# Patient Record
Sex: Male | Born: 1945 | Race: White | Hispanic: No | Marital: Married | Smoking: Never smoker
Health system: Southern US, Community
[De-identification: ages and names within clinical notes are randomized; demographics above are authoritative.]

## PROBLEM LIST (undated history)

## (undated) DIAGNOSIS — H3530 Unspecified macular degeneration: Secondary | ICD-10-CM

## (undated) DIAGNOSIS — E079 Disorder of thyroid, unspecified: Secondary | ICD-10-CM

## (undated) DIAGNOSIS — M1990 Unspecified osteoarthritis, unspecified site: Secondary | ICD-10-CM

## (undated) DIAGNOSIS — E039 Hypothyroidism, unspecified: Secondary | ICD-10-CM

## (undated) DIAGNOSIS — E785 Hyperlipidemia, unspecified: Secondary | ICD-10-CM

## (undated) DIAGNOSIS — K219 Gastro-esophageal reflux disease without esophagitis: Secondary | ICD-10-CM

## (undated) HISTORY — DX: Unspecified macular degeneration: H35.30

## (undated) HISTORY — DX: Gastro-esophageal reflux disease without esophagitis: K21.9

## (undated) HISTORY — DX: Disorder of thyroid, unspecified: E07.9

## (undated) HISTORY — DX: Hyperlipidemia, unspecified: E78.5

## (undated) HISTORY — PX: SHOULDER ARTHROSCOPY W/ ROTATOR CUFF REPAIR: SHX2400

## (undated) HISTORY — PX: ANKLE ARTHROSCOPY WITH REPAIR SUBLUXING TENDON: SHX5584

## (undated) HISTORY — PX: KNEE CARTILAGE SURGERY: SHX688

---

## 2001-01-27 ENCOUNTER — Encounter: Payer: Self-pay | Admitting: Orthopaedic Surgery

## 2001-01-27 ENCOUNTER — Ambulatory Visit (HOSPITAL_COMMUNITY): Admission: RE | Admit: 2001-01-27 | Discharge: 2001-01-27 | Payer: Self-pay | Admitting: Orthopaedic Surgery

## 2001-02-01 ENCOUNTER — Encounter: Payer: Self-pay | Admitting: Orthopaedic Surgery

## 2001-02-01 ENCOUNTER — Ambulatory Visit (HOSPITAL_COMMUNITY): Admission: RE | Admit: 2001-02-01 | Discharge: 2001-02-01 | Payer: Self-pay | Admitting: Orthopaedic Surgery

## 2003-12-14 ENCOUNTER — Emergency Department (HOSPITAL_COMMUNITY): Admission: EM | Admit: 2003-12-14 | Discharge: 2003-12-14 | Payer: Self-pay | Admitting: Emergency Medicine

## 2010-09-30 ENCOUNTER — Other Ambulatory Visit (HOSPITAL_COMMUNITY): Payer: Self-pay | Admitting: Family Medicine

## 2010-09-30 DIAGNOSIS — E0590 Thyrotoxicosis, unspecified without thyrotoxic crisis or storm: Secondary | ICD-10-CM

## 2010-10-12 ENCOUNTER — Emergency Department (HOSPITAL_COMMUNITY)
Admission: EM | Admit: 2010-10-12 | Discharge: 2010-10-13 | Disposition: A | Discharge status: Home / Self Care | Payer: BC Managed Care – PPO | Attending: Emergency Medicine | Admitting: Emergency Medicine

## 2010-10-12 DIAGNOSIS — IMO0002 Reserved for concepts with insufficient information to code with codable children: Secondary | ICD-10-CM | POA: Insufficient documentation

## 2010-10-12 DIAGNOSIS — S0180XA Unspecified open wound of other part of head, initial encounter: Secondary | ICD-10-CM | POA: Insufficient documentation

## 2010-10-13 ENCOUNTER — Encounter (HOSPITAL_COMMUNITY)
Admission: RE | Admit: 2010-10-13 | Discharge: 2010-10-13 | Disposition: A | Discharge status: Home / Self Care | Payer: BC Managed Care – PPO | Source: Ambulatory Visit | Attending: Family Medicine | Admitting: Family Medicine

## 2010-10-13 DIAGNOSIS — E0590 Thyrotoxicosis, unspecified without thyrotoxic crisis or storm: Secondary | ICD-10-CM | POA: Insufficient documentation

## 2010-10-14 ENCOUNTER — Ambulatory Visit (HOSPITAL_COMMUNITY)
Admission: RE | Admit: 2010-10-14 | Discharge: 2010-10-14 | Disposition: A | Discharge status: Home / Self Care | Payer: BC Managed Care – PPO | Source: Ambulatory Visit | Attending: Family Medicine | Admitting: Family Medicine

## 2010-10-14 DIAGNOSIS — E0590 Thyrotoxicosis, unspecified without thyrotoxic crisis or storm: Secondary | ICD-10-CM | POA: Insufficient documentation

## 2010-10-14 MED ORDER — SODIUM IODIDE I 131 CAPSULE
9.1000 | Freq: Once | INTRAVENOUS | Status: AC | PRN
  Administered 2010-10-13: 9.1 via ORAL

## 2010-10-14 MED ORDER — SODIUM PERTECHNETATE TC 99M INJECTION
10.5000 | Freq: Once | INTRAVENOUS | Status: AC | PRN
  Administered 2010-10-14: 11 via INTRAVENOUS

## 2013-03-19 ENCOUNTER — Encounter: Payer: Self-pay | Admitting: Internal Medicine

## 2013-04-30 ENCOUNTER — Ambulatory Visit (AMBULATORY_SURGERY_CENTER): Payer: Self-pay | Admitting: *Deleted

## 2013-04-30 VITALS — Ht 72.0 in | Wt 203.2 lb

## 2013-04-30 DIAGNOSIS — Z1211 Encounter for screening for malignant neoplasm of colon: Secondary | ICD-10-CM

## 2013-04-30 MED ORDER — NA SULFATE-K SULFATE-MG SULF 17.5-3.13-1.6 GM/177ML PO SOLN: ORAL | Status: DC

## 2013-04-30 NOTE — Progress Notes (Signed)
Patient denies any allergies to eggs or soy. Patient denies any problems with anesthesia. No oxygen use at home. No diet/wt. Loss pills.

## 2013-05-01 ENCOUNTER — Encounter: Payer: Self-pay | Admitting: Internal Medicine

## 2013-05-07 ENCOUNTER — Encounter: Payer: Self-pay | Admitting: Internal Medicine

## 2013-05-14 ENCOUNTER — Ambulatory Visit (AMBULATORY_SURGERY_CENTER): Payer: No Typology Code available for payment source | Admitting: Internal Medicine

## 2013-05-14 ENCOUNTER — Encounter: Payer: Self-pay | Admitting: Internal Medicine

## 2013-05-14 VITALS — BP 119/58 | HR 52 | Temp 96.6°F | Resp 19 | Ht 72.0 in | Wt 203.0 lb

## 2013-05-14 DIAGNOSIS — D129 Benign neoplasm of anus and anal canal: Secondary | ICD-10-CM

## 2013-05-14 DIAGNOSIS — D128 Benign neoplasm of rectum: Secondary | ICD-10-CM

## 2013-05-14 DIAGNOSIS — Z1211 Encounter for screening for malignant neoplasm of colon: Secondary | ICD-10-CM

## 2013-05-14 DIAGNOSIS — D126 Benign neoplasm of colon, unspecified: Secondary | ICD-10-CM

## 2013-05-14 MED ORDER — SODIUM CHLORIDE 0.9 % IV SOLN: 500.0000 mL | INTRAVENOUS | Status: DC

## 2013-05-14 NOTE — Op Note (Signed)
[place]  [address] [place], [address]   COLONOSCOPY PROCEDURE REPORT  PATIENT: Eddie Young, Eddie Young  MR#: [id number] BIRTHDATE: [birth year] , 67  yrs. old GENDER: Male ENDOSCOPIST: Gatha Mayer, MD, [facility] PROCEDURE DATE:  05/14/2013 PROCEDURE:   Colonoscopy with biopsy First Screening Colonoscopy - Avg.  risk and is 50 yrs.  old or older - No.  Prior Negative Screening - Now for repeat screening. 10 or more years since last screening  History of Adenoma - Now for follow-up colonoscopy & has been > or = to 3 yrs.  N/A  Polyps Removed Today? Yes. ASA CLASS:   Class II INDICATIONS:average risk screening and Last colonoscopy performed 10 years ago. MEDICATIONS: Propofol (Diprivan) 180  DESCRIPTION OF PROCEDURE:   After the risks benefits and alternatives of the procedure were thoroughly explained, informed consent was obtained.  A digital rectal exam revealed no abnormalities of the rectum, A digital rectal exam revealed no prostatic nodules, and A digital rectal exam revealed the prostate was not enlarged.   The LB [id number]  endoscope was introduced through the anus and advanced to the cecum, which was identified by both the appendix and ileocecal valve. No adverse events experienced.   The quality of the prep was excellent using Suprep  The instrument was then slowly withdrawn as the colon was fully examined.  COLON FINDINGS: Three diminutive sessile polyps were found at the splenic flexure.  A polypectomy was performed with cold forceps. The resection was complete and the polyp tissue was completely retrieved.   The colon mucosa was otherwise normal.   A right colon retroflexion was performed.  Retroflexed views revealed no abnormalities. The time to cecum=1 minutes 28 seconds.  Withdrawal time=10 minutes 29 seconds.  The scope was withdrawn and the procedure completed. COMPLICATIONS: There were no complications.  ENDOSCOPIC IMPRESSION: 1.    Three diminutive sessile polyps were found at the splenic flexure; polypectomy was performed with cold forceps 2.   The colon mucosa was otherwise normal - excellent prep  RECOMMENDATIONS: Timing of repeat colonoscopy will be determined by pathology findings.   eSigned:  Gatha Mayer, MD, [facility] 05/14/2013 1:43 PM   cc: The Patient and Juanita Craver, MD

## 2013-05-14 NOTE — Patient Instructions (Addendum)
I found and removed three tiny polyps. They should be benign and may not be pre-cancerous.  I will let you know pathology results and when to have another routine colonoscopy by mail.  I appreciate the opportunity to care for you. Gatha Mayer, MD, FACG  YOU HAD AN ENDOSCOPIC PROCEDURE TODAY AT [facility]: Refer to the procedure report that was given to you for any specific questions about what was found during the examination.  If the procedure report does not answer your questions, please call your gastroenterologist to clarify.  If you requested that your care partner not be given the details of your procedure findings, then the procedure report has been included in a sealed envelope for you to review at your convenience later.  YOU SHOULD EXPECT: Some feelings of bloating in the abdomen. Passage of more gas than usual.  Walking can help get rid of the air that was put into your GI tract during the procedure and reduce the bloating. If you had a lower endoscopy (such as a colonoscopy or flexible sigmoidoscopy) you may notice spotting of blood in your stool or on the toilet paper. If you underwent a bowel prep for your procedure, then you may not have a normal bowel movement for a few days.  DIET: Your first meal following the procedure should be a light meal and then it is ok to progress to your normal diet.  A half-sandwich or bowl of soup is an example of a good first meal.  Heavy or fried foods are harder to digest and may make you feel nauseous or bloated.  Likewise meals heavy in dairy and vegetables can cause extra gas to form and this can also increase the bloating.  Drink plenty of fluids but you should avoid alcoholic beverages for 24 hours.  ACTIVITY: Your care partner should take you home directly after the procedure.  You should plan to take it easy, moving slowly for the rest of the day.  You can resume normal activity the day after the procedure however you should  NOT DRIVE or use heavy machinery for 24 hours (because of the sedation medicines used during the test).    SYMPTOMS TO REPORT IMMEDIATELY: A gastroenterologist can be reached at any hour.  During normal business hours, 8:30 AM to 5:00 PM Monday through Friday, call [phone].  After hours and on weekends, please call the GI answering service at [phone] who will take a message and have the physician on call contact you.   Following lower endoscopy (colonoscopy or flexible sigmoidoscopy):  Excessive amounts of blood in the stool  Significant tenderness or worsening of abdominal pains  Swelling of the abdomen that is new, acute  Fever of 100F or higher    FOLLOW UP: If any biopsies were taken you will be contacted by phone or by letter within the next 1-3 weeks.  Call your gastroenterologist if you have not heard about the biopsies in 3 weeks.  Our staff will call the home number listed on your records the next business day following your procedure to check on you and address any questions or concerns that you may have at that time regarding the information given to you following your procedure. This is a courtesy call and so if there is no answer at the home number and we have not heard from you through the emergency physician on call, we will assume that you have returned to your regular daily activities without incident.  SIGNATURES/CONFIDENTIALITY: You and/or your care partner have signed paperwork which will be entered into your electronic medical record.  These signatures attest to the fact that that the information above on your After Visit Summary has been reviewed and is understood.  Full responsibility of the confidentiality of this discharge information lies with you and/or your care-partner.  Polyp information given.

## 2013-05-14 NOTE — Progress Notes (Signed)
Report to pacu rn, vss, bbs=clear 

## 2013-05-14 NOTE — Progress Notes (Signed)
Called to room to assist during endoscopic procedure.  Patient ID and intended procedure confirmed with present staff. Received instructions for my participation in the procedure from the performing physician.  

## 2013-05-15 ENCOUNTER — Telehealth: Payer: Self-pay | Admitting: *Deleted

## 2013-05-15 NOTE — Telephone Encounter (Signed)
  Follow up Call-  Call back number 05/14/2013  Post procedure Call Back phone  # [phone]   Permission to leave phone message Yes     Patient questions:  Do you have a fever, pain , or abdominal swelling? no Pain Score  0 *  Have you tolerated food without any problems? yes  Have you been able to return to your normal activities? yes  Do you have any questions about your discharge instructions: Diet   no Medications  no Follow up visit  no  Do you have questions or concerns about your Care? no  Actions: * If pain score is 4 or above: No action needed, pain <4.

## 2013-05-19 ENCOUNTER — Encounter: Payer: Self-pay | Admitting: Internal Medicine

## 2013-05-19 NOTE — Progress Notes (Signed)
Quick Note:  Mucosal polyps Repeat colonoscopy 10 years 2025 - maybe (at 52) ______

## 2013-09-20 ENCOUNTER — Encounter: Payer: Self-pay | Admitting: Internal Medicine

## 2016-11-02 ENCOUNTER — Encounter (INDEPENDENT_AMBULATORY_CARE_PROVIDER_SITE_OTHER): Payer: Self-pay | Admitting: Orthopaedic Surgery

## 2016-11-02 ENCOUNTER — Ambulatory Visit (INDEPENDENT_AMBULATORY_CARE_PROVIDER_SITE_OTHER): Payer: BLUE CROSS/BLUE SHIELD | Admitting: Orthopaedic Surgery

## 2016-11-02 ENCOUNTER — Ambulatory Visit (INDEPENDENT_AMBULATORY_CARE_PROVIDER_SITE_OTHER): Payer: BLUE CROSS/BLUE SHIELD

## 2016-11-02 VITALS — BP 155/75 | HR 50 | Resp 14 | Ht 72.0 in | Wt 208.0 lb

## 2016-11-02 DIAGNOSIS — M25561 Pain in right knee: Secondary | ICD-10-CM

## 2016-11-02 DIAGNOSIS — G8929 Other chronic pain: Secondary | ICD-10-CM

## 2016-11-02 MED ORDER — METHYLPREDNISOLONE ACETATE 40 MG/ML IJ SUSP
80.0000 mg | INTRAMUSCULAR | Status: AC | PRN
  Administered 2016-11-02: 80 mg

## 2016-11-02 MED ORDER — BUPIVACAINE HCL 0.5 % IJ SOLN
3.0000 mL | INTRAMUSCULAR | Status: AC | PRN
  Administered 2016-11-02: 3 mL via INTRA_ARTICULAR

## 2016-11-02 MED ORDER — LIDOCAINE HCL 1 % IJ SOLN
5.0000 mL | INTRAMUSCULAR | Status: AC | PRN
  Administered 2016-11-02: 5 mL

## 2016-11-02 NOTE — Progress Notes (Signed)
Office Visit Note   Patient: Eddie Young           Date of Birth: 06-25-1945           MRN: [id number] Visit Date: 11/02/2016              Requested by: Stephens Shire, MD [address] [place], [place] [address] PCP: Stephens Shire, MD   Assessment & Plan: Visit Diagnoses:  1. Chronic pain of right knee   Moderate tricompartmental degenerative arthritis right knee  Plan: Long discussion regarding x-ray findings and treatment options. We'll try cortisone injection along the lateral compartment. Discussed NSAIDs. Also discussed knee replacement at some point in the future. That certainly will be his decision. We'll plan to see back as needed  Follow-Up Instructions: Return if symptoms worsen or fail to improve.   Orders:  Orders Placed This Encounter  Procedures  . Large Joint Injection/Arthrocentesis  . XR KNEE 3 VIEW RIGHT   No orders of the defined types were placed in this encounter.     Procedures: Large Joint Inj Date/Time: 11/02/2016 1:17 PM Performed by: Garald Balding Authorized by: Garald Balding   Consent Given by:  Patient Timeout: prior to procedure the correct patient, procedure, and site was verified   Indications:  Pain and joint swelling Location:  Knee Site:  R knee Prep: patient was prepped and draped in usual sterile fashion   Needle Size:  25 G Needle Length:  1.5 inches Approach:  Anteromedial Ultrasound Guidance: No   Fluoroscopic Guidance: No   Arthrogram: No   Medications:  5 mL lidocaine 1 %; 80 mg methylPREDNISolone acetate 40 MG/ML; 3 mL bupivacaine 0.5 % Aspiration Attempted: No   Patient tolerance:  Patient tolerated the procedure well with no immediate complications     Clinical Data: No additional findings.   Subjective: Chief Complaint  Patient presents with  . Right Knee - Pain    Eddie Young is a 71 y o that presents with R knee pain x 2 weeks.  He was walking lighthouse stairs in [place],  [place]. No fevers, chills or calf pain.  Initial onset of right knee pain after traveling to [place] to visit family. He's been home now 2 weeks and has had recurrent effusions and some pain predominantly along the lateral compartment. On occasion he has a sensation of his knee giving way. He does have a pullover knee support that's been helpful. Denies any back pain or groin discomfort. Pain is predominantly laterally  HPI  Review of Systems  Constitutional: Negative for fatigue.  HENT: Negative for hearing loss.   Respiratory: Negative for apnea, chest tightness and shortness of breath.   Cardiovascular: Negative for chest pain, palpitations and leg swelling.  Gastrointestinal: Negative for blood in stool, constipation and diarrhea.  Genitourinary: Negative for difficulty urinating.  Musculoskeletal: Positive for joint swelling. Negative for arthralgias, back pain, myalgias, neck pain and neck stiffness.  Neurological: Positive for weakness. Negative for numbness and headaches.  Hematological: Does not bruise/bleed easily.  Psychiatric/Behavioral: Negative for sleep disturbance. The patient is not nervous/anxious.      Objective: Vital Signs: BP (!) 155/75   Pulse (!) 50   Resp 14   Ht 6' (1.829 m)   Wt 208 lb (94.3 kg)   BMI 28.21 kg/m   Physical Exam  Ortho Exam awake alert and oriented 3. Comfortable sitting. Minimal effusion right knee. Knee was not hot or  warm. Diffuse lateral joint pain predominantly anteriorly. No medial joint pain. Some popliteal fullness with possible tibial cyst. No calf pain. Neurovascular exam intact distally. Lacks a few degrees to full extension and flexes over 105 without instability. Straight leg raise negative. Painless range of motion both hips. Walks without a limp  Specialty Comments:  No specialty comments available.  Imaging: Xr Knee 3 View Right  Result Date: 11/02/2016 3 views of the right knee were obtained standing  projection. There is increased valgus of about 7-8. There is near complete collapse of the lateral compartment there are peripheral osteophytes are identified in the lateral compartment so she with irregularity of the joint surface and the lateral compartment and subchondral sclerosis. There are tricompartmental degenerative changes and to a lesser extent the patellofemoral joint. No ectopic calcification. Changes above are consistent with moderate osteoarthritis predominantly in the patellofemoral and lateral compartments    PMFS History: There are no active problems to display for this patient.  Past Medical History:  Diagnosis Date  . GERD (gastroesophageal reflux disease)   . Hyperlipidemia   . Thyroid disease    hyper active    Family History  Problem Relation Age of Onset  . Thyroid cancer Maternal Grandmother   . Colon cancer Neg Hx     Past Surgical History:  Procedure Laterality Date  . ANKLE ARTHROSCOPY WITH REPAIR SUBLUXING TENDON    . KNEE CARTILAGE SURGERY    . SHOULDER ARTHROSCOPY W/ ROTATOR CUFF REPAIR     Social History   Occupational History  . Not on file.   Social History Main Topics  . Smoking status: Never Smoker  . Smokeless tobacco: Never Used  . Alcohol use 1.8 oz/week    3 Cans of beer per week  . Drug use: No  . Sexual activity: Not on file

## 2017-01-03 ENCOUNTER — Telehealth (INDEPENDENT_AMBULATORY_CARE_PROVIDER_SITE_OTHER): Payer: Self-pay | Admitting: Orthopaedic Surgery

## 2017-01-03 NOTE — Telephone Encounter (Signed)
Patient's granddaughter broke her foot and was wanting to know if Dr. Durward Fortes would take a look at her x-rays and see if she needs to see an orthopedic doctor once she gets back to [place].  CB#[phone].  Thank you.

## 2017-01-04 NOTE — Telephone Encounter (Signed)
Spoke with pt and told her PW would not be in office today. Wife said someone has called her, not sure who and it was completed.

## 2017-01-15 ENCOUNTER — Encounter (INDEPENDENT_AMBULATORY_CARE_PROVIDER_SITE_OTHER): Payer: Self-pay | Admitting: Orthopaedic Surgery

## 2017-01-15 ENCOUNTER — Ambulatory Visit (INDEPENDENT_AMBULATORY_CARE_PROVIDER_SITE_OTHER): Payer: BLUE CROSS/BLUE SHIELD | Admitting: Orthopaedic Surgery

## 2017-01-15 VITALS — BP 134/62 | HR 62 | Resp 12 | Ht 72.0 in | Wt 200.0 lb

## 2017-01-15 DIAGNOSIS — M170 Bilateral primary osteoarthritis of knee: Secondary | ICD-10-CM | POA: Diagnosis not present

## 2017-01-15 NOTE — Progress Notes (Signed)
Office Visit Note   Patient: Eddie Young           Date of Birth: 11-23-1945           MRN: [id number] Visit Date: 01/15/2017              Requested by: Stephens Shire, MD [address] [place], [place] [address] PCP: Stephens Shire, MD   Assessment & Plan: Visit Diagnoses:  1. Primary osteoarthritis of both knees     Plan: Tricompartmental degenerative arthrosis both knees predominantly in the lateral compartment. Long discussion regarding diagnosis and treatment options. We discussed exercises, NSAIDs, injections and even knee replacement. He just needed some guidance with his activity level and the do's and don'ts  Follow-Up Instructions: Return if symptoms worsen or fail to improve.   Orders:  No orders of the defined types were placed in this encounter.  No orders of the defined types were placed in this encounter.     Procedures: No procedures performed   Clinical Data: No additional findings.   Subjective: Chief Complaint  Patient presents with  . Right Knee - Pain, Weakness    25/18 he received a cortisone injection, did not make any difference.Eddie Young is a 72 y o here today for chronic Bilateral knee pain.Right >left. Visit 11/02/16 he received a cortisone injection, did not make any difference.  . Left Knee - Weakness, Pain  Seen in October for evaluation of right knee pain. Films revealed tricompartmental degenerative arthrosis with increased valgus. Cortisone injection was helpful for short time. Now having bilateral knee pain. Wants some guidance as to activity level and different treatment options. Denies any back pain or groin discomfort. No thigh pain. No claudication  HPI  Review of Systems  Constitutional: Negative for fatigue.  HENT: Positive for hearing loss.   Eyes: Positive for photophobia.  Respiratory: Negative for apnea, chest tightness and shortness of breath.   Cardiovascular: Negative for chest pain,  palpitations and leg swelling.  Gastrointestinal: Negative for blood in stool, constipation and diarrhea.  Genitourinary: Negative for difficulty urinating.  Musculoskeletal: Negative for arthralgias, back pain, joint swelling, myalgias, neck pain and neck stiffness.  Neurological: Positive for weakness. Negative for numbness and headaches.  Hematological: Does not bruise/bleed easily.  Psychiatric/Behavioral: Negative for sleep disturbance. The patient is not nervous/anxious.      Objective: Vital Signs: BP 134/62   Pulse 62   Resp 12   Ht 6' (1.829 m)   Wt 200 lb (90.7 kg)   BMI 27.12 kg/m   Physical Exam  Ortho Exam awake alert and oriented 3. Comfortable sitting straight leg raise negative bilaterally. Painless range of motion both hips. No knee effusions bilaterally. Increased valgus with weightbearing. Minimal patellar crepitation. No instability. Mild popliteal fullness bilaterally but no significant pain. No calf pain. Good sensibility distally feet were warm good capillary refill minimal joint pain bilaterally.  Specialty Comments:  No specialty comments available.  Imaging: No results found.   PMFS History: Patient Active Problem List   Diagnosis Date Noted  . Primary osteoarthritis of both knees 01/15/2017   Past Medical History:  Diagnosis Date  . GERD (gastroesophageal reflux disease)   . Hyperlipidemia   . Macular degeneration   . Thyroid disease    hyper active    Family History  Problem Relation Age of Onset  . Thyroid cancer Maternal Grandmother   . Colon cancer Neg Hx     Past Surgical  History:  Procedure Laterality Date  . ANKLE ARTHROSCOPY WITH REPAIR SUBLUXING TENDON    . KNEE CARTILAGE SURGERY    . SHOULDER ARTHROSCOPY W/ ROTATOR CUFF REPAIR     Social History   Occupational History  . Not on file  Tobacco Use  . Smoking status: Never Smoker  . Smokeless tobacco: Never Used  Substance and Sexual Activity  . Alcohol use: Yes     Alcohol/week: 1.8 oz    Types: 3 Cans of beer per week  . Drug use: No  . Sexual activity: Not on file

## 2017-05-28 ENCOUNTER — Ambulatory Visit (INDEPENDENT_AMBULATORY_CARE_PROVIDER_SITE_OTHER): Payer: BLUE CROSS/BLUE SHIELD | Admitting: Orthopaedic Surgery

## 2017-05-28 ENCOUNTER — Encounter (INDEPENDENT_AMBULATORY_CARE_PROVIDER_SITE_OTHER): Payer: Self-pay | Admitting: Orthopaedic Surgery

## 2017-05-28 ENCOUNTER — Ambulatory Visit (INDEPENDENT_AMBULATORY_CARE_PROVIDER_SITE_OTHER): Payer: Self-pay

## 2017-05-28 VITALS — BP 127/63 | HR 56 | Resp 18 | Wt 200.0 lb

## 2017-05-28 DIAGNOSIS — M25571 Pain in right ankle and joints of right foot: Secondary | ICD-10-CM

## 2017-05-28 NOTE — Progress Notes (Signed)
Office Visit Note   Patient: Eddie Young           Date of Birth: [birth year]           MRN: [id number] Visit Date: 05/28/2017              Requested by: Stephens Shire, MD [address] [place], [place] [address] PCP: Stephens Shire, MD   Assessment & Plan: Visit Diagnoses:  1. Pain in right ankle and joints of right foot     Plan: Painful corn over the metatarsal phalangeal joint right little toe.  This is pared removing the central core with almost immediate relief of his pain.  X-rays were negative discussed comfortable shoes and even possible shoe inserts as Mr Mayberry supinates when he walks.  He will let me know.  Follow-Up Instructions: Return if symptoms worsen or fail to improve.   Orders:  Orders Placed This Encounter  Procedures  . XR Foot Complete Right   No orders of the defined types were placed in this encounter.     Procedures: No procedures performed   Clinical Data: No additional findings.   Subjective: Chief Complaint  Patient presents with  . Right Foot - Pain  . New Patient (Initial Visit)    BONE SPUR REMOVED 8 YEARS AGO, STARTED HAVING PAIN ON HEEL FOR 6-8 MONTHS.  Mr. Kedzierski relates persistent pain localized to the plantar aspect of his right little toe at the level of the metatarsal phalangeal joint.  No obvious injury or trauma.  He is status post prior peroneal tendon reconstruction and has done well.  No obvious swelling.  He has recurrent pain on an episodic basis particularly when he stands for a length of time.  Numbness or tingling.  Does have history of bilateral knee osteoarthritis  HPI  Review of Systems  Constitutional: Negative for fatigue and fever.  HENT: Negative for ear pain.   Eyes: Negative for pain.  Respiratory: Negative for cough and shortness of breath.   Cardiovascular: Negative for leg swelling.  Gastrointestinal: Negative for constipation and diarrhea.  Genitourinary: Negative for  difficulty urinating.  Musculoskeletal: Negative for back pain and neck pain.  Skin: Negative for rash.  Allergic/Immunologic: Negative for food allergies.  Neurological: Negative for weakness and numbness.  Hematological: Bruises/bleeds easily.  Psychiatric/Behavioral: Negative for sleep disturbance.     Objective: Vital Signs: BP 127/63 (BP Location: Left Arm, Patient Position: Sitting, Cuff Size: Normal)   Pulse (!) 56   Resp 18   Wt 200 lb (90.7 kg)   BMI 27.12 kg/m   Physical Exam  Constitutional: He is oriented to person, place, and time. He appears well-developed and well-nourished.  HENT:  Mouth/Throat: Oropharynx is clear and moist.  Eyes: Pupils are equal, round, and reactive to light. EOM are normal.  Pulmonary/Chest: Effort normal.  Neurological: He is alert and oriented to person, place, and time.  Skin: Skin is warm and dry.  Psychiatric: He has a normal mood and affect. His behavior is normal.    Ortho Exam awake alert and oriented x3.  Comfortable sitting.  Examination of the right foot reveals a corn directly over the metatarsal head of the right little toe there is a central core.  I actually pared this down removing the central core with immediate relief of his pain.  No obvious deformity of the toe there is a very small bunionette.  Skin intact.  No erythema or ecchymosis Specialty  Comments:  No specialty comments available.  Imaging: Xr Foot Complete Right  Result Date: 05/28/2017 Films of the right foot were obtained in several projections.  There is a very small plantar heel spur which is asymptomatic.  There is some very minimal degenerative changes in the midfoot.  No obvious deformity at metatarsal phalangeal joint    PMFS History: Patient Active Problem List   Diagnosis Date Noted  . Primary osteoarthritis of both knees 01/15/2017   Past Medical History:  Diagnosis Date  . GERD (gastroesophageal reflux disease)   . Hyperlipidemia   . Macular  degeneration   . Thyroid disease    hyper active    Family History  Problem Relation Age of Onset  . Thyroid cancer Maternal Grandmother   . Colon cancer Neg Hx     Past Surgical History:  Procedure Laterality Date  . ANKLE ARTHROSCOPY WITH REPAIR SUBLUXING TENDON    . KNEE CARTILAGE SURGERY    . SHOULDER ARTHROSCOPY W/ ROTATOR CUFF REPAIR     Social History   Occupational History  . Not on file  Tobacco Use  . Smoking status: Never Smoker  . Smokeless tobacco: Never Used  Substance and Sexual Activity  . Alcohol use: Yes    Alcohol/week: 1.8 oz    Types: 3 Cans of beer per week  . Drug use: No  . Sexual activity: Not on file

## 2018-10-28 ENCOUNTER — Other Ambulatory Visit: Payer: Self-pay

## 2018-10-28 ENCOUNTER — Ambulatory Visit (INDEPENDENT_AMBULATORY_CARE_PROVIDER_SITE_OTHER): Payer: BLUE CROSS/BLUE SHIELD | Admitting: Orthopaedic Surgery

## 2018-10-28 ENCOUNTER — Ambulatory Visit: Payer: Self-pay

## 2018-10-28 ENCOUNTER — Encounter: Payer: Self-pay | Admitting: Orthopaedic Surgery

## 2018-10-28 VITALS — BP 163/82 | HR 51 | Ht 71.0 in | Wt 205.0 lb

## 2018-10-28 DIAGNOSIS — M25512 Pain in left shoulder: Secondary | ICD-10-CM

## 2018-10-28 DIAGNOSIS — G8929 Other chronic pain: Secondary | ICD-10-CM | POA: Diagnosis not present

## 2018-10-28 MED ORDER — BUPIVACAINE HCL 0.5 % IJ SOLN
2.0000 mL | INTRAMUSCULAR | Status: AC | PRN
  Administered 2018-10-28: 09:00:00 2 mL via INTRA_ARTICULAR

## 2018-10-28 MED ORDER — LIDOCAINE HCL 2 % IJ SOLN
2.0000 mL | INTRAMUSCULAR | Status: AC | PRN
  Administered 2018-10-28: 09:00:00 2 mL

## 2018-10-28 MED ORDER — METHYLPREDNISOLONE ACETATE 40 MG/ML IJ SUSP
80.0000 mg | INTRAMUSCULAR | Status: AC | PRN
  Administered 2018-10-28: 09:00:00 80 mg via INTRA_ARTICULAR

## 2018-10-28 NOTE — Progress Notes (Signed)
Office Visit Note   Patient: Eddie Young           Date of Birth: [birth year]           MRN: [id number] Visit Date: 10/28/2018              Requested by: Stephens Shire, MD [address] [place] [address] PCP: Stephens Shire, MD   Assessment & Plan: Visit Diagnoses:  1. Chronic left shoulder pain     Plan: Insidious onset left shoulder pain several months ago without history of injury or trauma.  Mr. Dondiego is concerned that he may have a rotator cuff tear as he had similar symptoms in his right shoulder about 10 years ago with a confirmed diagnosis of rotator cuff tear with subsequent repair.  Doing well.  Long discussion regarding diagnostic possibilities.  We will start with a subacromial cortisone injection and monitor his response.  If no improvement over the next 3 weeks would suggest MRI scan  Follow-Up Instructions: Return if symptoms worsen or fail to improve.   Orders:  Orders Placed This Encounter  Procedures  . Large Joint Inj: L subacromial bursa  . XR Shoulder Left   No orders of the defined types were placed in this encounter.     Procedures: Large Joint Inj: L subacromial bursa on 10/28/2018 8:49 AM Indications: pain and diagnostic evaluation Details: 25 G 1.5 in needle, anterolateral approach  Arthrogram: No  Medications: 2 mL lidocaine 2 %; 2 mL bupivacaine 0.5 %; 80 mg methylPREDNISolone acetate 40 MG/ML Consent was given by the patient. Immediately prior to procedure a time out was called to verify the correct patient, procedure, equipment, support staff and site/side marked as required. Patient was prepped and draped in the usual sterile fashion.       Clinical Data: No additional findings.   Subjective: Chief Complaint  Patient presents with  . Left Shoulder - Pain  Patient presents today for left shoulder pain X 6 months. No known injury. His pain is located lateral and superior. His pain occurs when he lifts  his arm out in front of him. No numbness, tingling, or weakness. He does not take anything for pain. No previous surgery on the left shoulder, but has had rotator cuff repair on the right shoulder. He is right hand dominant.  Some difficulty sleeping on his left shoulder and some mild discomfort with overhead motion.  Does not have significant compromise of his activities  HPI  Review of Systems   Objective: Vital Signs: BP (!) 163/82   Pulse (!) 51   Ht 5\' 11"  (1.803 m)   Wt 205 lb (93 kg)   BMI 28.59 kg/m   Physical Exam Constitutional:      Appearance: He is well-developed.  Eyes:     Pupils: Pupils are equal, round, and reactive to light.  Pulmonary:     Effort: Pulmonary effort is normal.  Skin:    General: Skin is warm and dry.  Neurological:     Mental Status: He is alert and oriented to person, place, and time.  Psychiatric:        Behavior: Behavior normal.     Ortho Exam awake alert and oriented x3.  Comfortable sitting.  Circuitous arc of overhead motion left shoulder with pain along the anterior lateral subacromial region.  No loss of strength.  Positive impingement and empty can testing.  Good grip and release.  Skin  intact.  No pain over the acromioclavicular joint  Specialty Comments:  No specialty comments available.  Imaging: No results found.   PMFS History: Patient Active Problem List   Diagnosis Date Noted  . Pain in left shoulder 10/28/2018  . Primary osteoarthritis of both knees 01/15/2017   Past Medical History:  Diagnosis Date  . GERD (gastroesophageal reflux disease)   . Hyperlipidemia   . Macular degeneration   . Thyroid disease    hyper active    Family History  Problem Relation Age of Onset  . Thyroid cancer Maternal Grandmother   . Colon cancer Neg Hx     Past Surgical History:  Procedure Laterality Date  . ANKLE ARTHROSCOPY WITH REPAIR SUBLUXING TENDON    . KNEE CARTILAGE SURGERY    . SHOULDER ARTHROSCOPY W/ ROTATOR CUFF  REPAIR     Social History   Occupational History  . Not on file  Tobacco Use  . Smoking status: Never Smoker  . Smokeless tobacco: Never Used  Substance and Sexual Activity  . Alcohol use: Yes    Alcohol/week: 3.0 standard drinks    Types: 3 Cans of beer per week  . Drug use: No  . Sexual activity: Not on file

## 2018-10-29 ENCOUNTER — Ambulatory Visit: Payer: No Typology Code available for payment source | Admitting: Orthopaedic Surgery

## 2018-11-12 ENCOUNTER — Other Ambulatory Visit: Payer: Self-pay

## 2018-11-12 DIAGNOSIS — Z20822 Contact with and (suspected) exposure to covid-19: Secondary | ICD-10-CM

## 2018-11-14 LAB — NOVEL CORONAVIRUS, NAA: SARS-CoV-2, NAA: NOT DETECTED

## 2019-01-12 ENCOUNTER — Other Ambulatory Visit: Payer: Self-pay

## 2019-01-12 ENCOUNTER — Encounter (HOSPITAL_COMMUNITY): Payer: Self-pay | Admitting: Emergency Medicine

## 2019-01-12 ENCOUNTER — Emergency Department (HOSPITAL_BASED_OUTPATIENT_CLINIC_OR_DEPARTMENT_OTHER): Payer: BLUE CROSS/BLUE SHIELD

## 2019-01-12 ENCOUNTER — Emergency Department (HOSPITAL_COMMUNITY)
Admission: EM | Admit: 2019-01-12 | Discharge: 2019-01-12 | Disposition: A | Discharge status: Home / Self Care | Payer: BLUE CROSS/BLUE SHIELD | Attending: Emergency Medicine | Admitting: Emergency Medicine

## 2019-01-12 DIAGNOSIS — S76901A Unspecified injury of unspecified muscles, fascia and tendons at thigh level, right thigh, initial encounter: Secondary | ICD-10-CM

## 2019-01-12 DIAGNOSIS — S76901D Unspecified injury of unspecified muscles, fascia and tendons at thigh level, right thigh, subsequent encounter: Secondary | ICD-10-CM | POA: Diagnosis not present

## 2019-01-12 DIAGNOSIS — Z7982 Long term (current) use of aspirin: Secondary | ICD-10-CM | POA: Insufficient documentation

## 2019-01-12 DIAGNOSIS — M79604 Pain in right leg: Secondary | ICD-10-CM | POA: Diagnosis present

## 2019-01-12 DIAGNOSIS — X58XXXD Exposure to other specified factors, subsequent encounter: Secondary | ICD-10-CM | POA: Insufficient documentation

## 2019-01-12 DIAGNOSIS — Z79899 Other long term (current) drug therapy: Secondary | ICD-10-CM | POA: Diagnosis not present

## 2019-01-12 NOTE — ED Triage Notes (Signed)
Per pt, states he injured his right hamstring weeks ago-states increased pain-went to UC and had lab sone-called him to come to ED due to elevated lab, possible DVT

## 2019-01-12 NOTE — Progress Notes (Signed)
VASCULAR LAB PRELIMINARY  PRELIMINARY  PRELIMINARY  PRELIMINARY  Right lower extremity venous duplex completed.    Preliminary report:  See CV proc for preliminary results.  Gave Dr. Eulis Foster report  Mauro Kaufmann, Kewon Statler, RVT 01/12/2019, 7:20 PM

## 2019-01-12 NOTE — Discharge Instructions (Signed)
Use the Ace wrap as needed for comfort.  Rest as needed.  Use Tylenol or Motrin for pain.

## 2019-01-12 NOTE — ED Notes (Signed)
PT denied below order for ace wrap. Pt reports "wrapping it makes it hurt worse I would not like to wrap it at this time thank you"

## 2019-01-12 NOTE — ED Provider Notes (Signed)
[facility] DEPT Provider Note   CSN: [id number] Arrival date & time: 01/12/19  [id number]     History Chief Complaint  Patient presents with  . Leg Pain    Eddie Young is a 74 y.o. male.  HPI   Patient presents for evaluation of right leg pain with swelling, worsening over the last few days.  He had an injury to the right hamstring region about 6 weeks ago.  No other recent trauma.  No shortness of breath, fever, chills, cough, weakness or dizziness.  He was seen earlier today at an urgent care, they apparently drew some blood, he went home, they received a call to come here to be evaluated for a complication of a blood clot.  This lab result is not currently available in the clinic is not currently open.  No prior history of DVT.  No other complaints.  There are no other known modifying factors.  Past Medical History:  Diagnosis Date  . GERD (gastroesophageal reflux disease)   . Hyperlipidemia   . Macular degeneration   . Thyroid disease    hyper active    Patient Active Problem List   Diagnosis Date Noted  . Pain in left shoulder 10/28/2018  . Primary osteoarthritis of both knees 01/15/2017    Past Surgical History:  Procedure Laterality Date  . ANKLE ARTHROSCOPY WITH REPAIR SUBLUXING TENDON    . KNEE CARTILAGE SURGERY    . SHOULDER ARTHROSCOPY W/ ROTATOR CUFF REPAIR         Family History  Problem Relation Age of Onset  . Thyroid cancer Maternal Grandmother   . Colon cancer Neg Hx     Social History   Tobacco Use  . Smoking status: Never Smoker  . Smokeless tobacco: Never Used  Substance Use Topics  . Alcohol use: Yes    Alcohol/week: 3.0 standard drinks    Types: 3 Cans of beer per week  . Drug use: No    Home Medications Prior to Admission medications   Medication Sig Start Date End Date Taking? Authorizing Provider  aspirin 325 MG tablet Take 325 mg by mouth daily.    [provider]  calcium carbonate  (OS-CAL) 600 MG TABS tablet Take 600 mg by mouth daily with breakfast.    [provider]  esomeprazole (NEXIUM) 40 MG capsule Take 40 mg by mouth daily at 12 noon.    [provider]  GLUCOSAMINE HCL PO Take 2,000 mg by mouth daily.    [provider]  methimazole (TAPAZOLE) 5 MG tablet Take 5 mg by mouth daily.    [provider]  Multiple Vitamins-Minerals (ICAPS) TABS Take by mouth.    [provider]  Omega-3 Fatty Acids (FISH OIL) 1000 MG CAPS Take 1 capsule by mouth daily.    [provider]  rosuvastatin (CRESTOR) 20 MG tablet Take 10 mg by mouth daily.    [provider]  Saw Palmetto 450 MG CAPS Take 1 capsule by mouth daily.    [provider]  vitamin E (VITAMIN E) 400 UNIT capsule Take 400 Units by mouth daily.    [provider]    Allergies    Penicillins  Review of Systems   Review of Systems  All other systems reviewed and are negative.   Physical Exam Updated Vital Signs BP (!) 153/82 (BP Location: Left Arm)   Pulse 67   Temp 98.8 F (37.1 C) (Oral)   Resp 17  SpO2 98%   Physical Exam Vitals and nursing note reviewed.  Constitutional:      General: He is not in acute distress.    Appearance: Normal appearance. He is well-developed. He is not ill-appearing, toxic-appearing or diaphoretic.  HENT:     Head: Normocephalic and atraumatic.     Right Ear: External ear normal.     Left Ear: External ear normal.  Eyes:     Conjunctiva/sclera: Conjunctivae normal.     Pupils: Pupils are equal, round, and reactive to light.  Neck:     Trachea: Phonation normal.  Cardiovascular:     Rate and Rhythm: Normal rate.  Pulmonary:     Effort: Pulmonary effort is normal.  Musculoskeletal:        General: Normal range of motion.     Cervical back: Normal range of motion and neck supple.     Comments: Right leg tender, mass, mid posterior medial thigh region.  No ecchymosis in this area.  No  popliteal mass.  Mild left calf swelling with tenderness.  Mild pretibial edema right lower leg greater than left lower leg.  Neurovascular intact distally in the right lower leg.  Skin:    General: Skin is warm and dry.  Neurological:     Mental Status: He is alert and oriented to person, place, and time.     Cranial Nerves: No cranial nerve deficit.     Sensory: No sensory deficit.     Motor: No abnormal muscle tone.     Coordination: Coordination normal.  Psychiatric:        Mood and Affect: Mood normal.        Behavior: Behavior normal.        Thought Content: Thought content normal.        Judgment: Judgment normal.     ED Results / Procedures / Treatments   Labs (all labs ordered are listed, but only abnormal results are displayed) Labs Reviewed - No data to display  EKG None  Radiology VAS [place] LOWER EXTREMITY VENOUS (DVT) (ONLY [facility] & [facility])  Result Date: 01/12/2019  Lower Venous Study Indications: Posterior thigh pain. Patient had recent hamstring injury. Urgent Care sent patient for ultrasound after D-Dimer was elevated.  Limitations: Patient unable to extend leg secondary to posterior thigh pain. Comparison Study: No prior study on file for comparison. Performing Technologist: Sharion Dove RVS  Examination Guidelines: A complete evaluation includes B-mode imaging, spectral Doppler, color Doppler, and power Doppler as needed of all accessible portions of each vessel. Bilateral testing is considered an integral part of a complete examination. Limited examinations for reoccurring indications may be performed as noted.  +---------+---------------+---------+-----------+----------+--------------+ RIGHT    CompressibilityPhasicitySpontaneityPropertiesThrombus Aging +---------+---------------+---------+-----------+----------+--------------+ CFV                                                   Not visualized  +---------+---------------+---------+-----------+----------+--------------+ SFJ                                                   Not visualized +---------+---------------+---------+-----------+----------+--------------+ FV Prox  Full           Yes      Yes                                 +---------+---------------+---------+-----------+----------+--------------+  FV Mid   Full                                                        +---------+---------------+---------+-----------+----------+--------------+ FV DistalFull                                                        +---------+---------------+---------+-----------+----------+--------------+ PFV      Full                                                        +---------+---------------+---------+-----------+----------+--------------+ POP      Full           Yes      Yes                                 +---------+---------------+---------+-----------+----------+--------------+ PTV      Full                                                        +---------+---------------+---------+-----------+----------+--------------+ PERO     Full                                                        +---------+---------------+---------+-----------+----------+--------------+ Gastroc  Full                                                        +---------+---------------+---------+-----------+----------+--------------+   Left Technical Findings: Left leg not evaluated.   Summary: Right: There is no evidence of deep vein thrombosis in the lower extremity. However, portions of this examination were limited- see technologist comments above. Large area of mixed echoes measuring 7.44cm X 7.86cm in the posterior thigh, possibly consistent with muscle tear.  *See table(s) above for measurements and observations.    Preliminary     Procedures Procedures (including critical care time)  Medications Ordered in  ED Medications - No data to display  ED Course  I have reviewed the triage vital signs and the nursing notes.  Pertinent labs & imaging results that were available during my care of the patient were reviewed by me and considered in my medical decision making (see chart for details).  Clinical Course as of Jan 11 1929  Nancy Fetter Jan 12, 2019  1823 Venous Doppler ordered to rule out right leg DVT.   [EW]    Clinical Course User Index [EW] Daleen Bo, MD   MDM Rules/Calculators/A&P  Patient Vitals for the past 24 hrs:  BP Temp Temp src Pulse Resp SpO2  01/12/19 1750 (!) 153/82 98.8 F (37.1 C) Oral 67 17 98 %   7:15 PM-discussed with vascular technician, no DVT present.  He does have a large mid thigh muscle tear, visible by ultrasound imaging.  7:18 PM Reevaluation with update and discussion. After initial assessment and treatment, an updated evaluation reveals no change in clinical status.  Findings discussed with the patient and all questions were answered. [place] Decision Making: Left leg pain and swelling consistent with muscle tear, without evidence for DVT on imaging.  Possible worsening of the tear several days ago, from injury 6 weeks ago.  No indication for hospitalization or immediate intervention at this time.  Will treat symptomatically and refer to orthopedics.  CRITICAL CARE-no Performed by: Daleen Bo   Nursing Notes Reviewed/ Care Coordinated Applicable Imaging Reviewed Interpretation of Laboratory Data incorporated into ED treatment  The patient appears reasonably screened and/or stabilized for discharge and I doubt any other medical condition or other [facility] requiring further screening, evaluation, or treatment in the ED at this time prior to discharge.  Plan: Home Medications-continue usual medicine, use Tylenol or ibuprofen if needed for pain; Home Treatments-Ace wrap and rest as needed; return here if the recommended  treatment, does not improve the symptoms; Recommended follow up-orthopedic follow-up as soon as possible for further care and treatment    Final Clinical Impression(s) / ED Diagnoses Final diagnoses:  Injury of muscle of right thigh    Rx / DC Orders ED Discharge Orders    None       Daleen Bo, MD 01/12/19 1931

## 2019-01-12 NOTE — ED Notes (Signed)
Pt verbalizes understanding of DC instructions. Pt belongings returned and is ambulatory out of ED.  

## 2019-01-14 ENCOUNTER — Ambulatory Visit: Payer: BLUE CROSS/BLUE SHIELD | Attending: Internal Medicine

## 2019-01-14 DIAGNOSIS — Z20822 Contact with and (suspected) exposure to covid-19: Secondary | ICD-10-CM

## 2019-01-16 ENCOUNTER — Telehealth: Payer: Self-pay | Admitting: Orthopaedic Surgery

## 2019-01-16 ENCOUNTER — Other Ambulatory Visit: Payer: Self-pay | Admitting: Orthopaedic Surgery

## 2019-01-16 ENCOUNTER — Ambulatory Visit: Payer: BLUE CROSS/BLUE SHIELD | Admitting: Orthopaedic Surgery

## 2019-01-16 DIAGNOSIS — M79652 Pain in left thigh: Secondary | ICD-10-CM

## 2019-01-16 LAB — NOVEL CORONAVIRUS, NAA: SARS-CoV-2, NAA: DETECTED — AB
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
## 2019-01-16 NOTE — Telephone Encounter (Signed)
Pt called in said he had an appt scheduled for today but had to cancel due to having covid. Pt is requesting a call back from dr.whitfield at least to discuss the pain he is having and see what he can do until he gets better.   [phone]

## 2019-01-16 NOTE — Telephone Encounter (Signed)
Order placed

## 2019-01-16 NOTE — Telephone Encounter (Signed)
Please advise 

## 2019-01-22 ENCOUNTER — Telehealth: Payer: Self-pay | Admitting: Orthopaedic Surgery

## 2019-01-22 NOTE — Telephone Encounter (Signed)
Called-head injury 1 to 2 weeks ago with thigh pain but no neurologic deficit according to the patient.  Has developed Covid and is housebound.  Being followed by his primary care physician. presently on gabapentin.  Would have primary care physician prescribe any medicines at this point and once he is over Covid come to the office so we can evaluate his his leg and consider further diagnostic testing.

## 2019-01-22 NOTE — Telephone Encounter (Signed)
Patient called requesting Dr. Durward Fortes for call back. Patient phone number is [phone].

## 2019-02-04 ENCOUNTER — Encounter: Payer: Self-pay | Admitting: Orthopaedic Surgery

## 2019-02-04 NOTE — Telephone Encounter (Signed)
Please address-find a readiology group in his network-thanks

## 2019-02-05 ENCOUNTER — Encounter: Payer: Self-pay | Admitting: Orthopaedic Surgery

## 2019-02-06 NOTE — Telephone Encounter (Signed)
Lauren, please look into this issue with the MRI scan and insurance coverage

## 2019-02-10 ENCOUNTER — Telehealth: Payer: Self-pay | Admitting: *Deleted

## 2019-02-10 ENCOUNTER — Other Ambulatory Visit: Payer: Self-pay | Admitting: Orthopaedic Surgery

## 2019-02-10 DIAGNOSIS — M79651 Pain in right thigh: Secondary | ICD-10-CM

## 2019-02-10 NOTE — Telephone Encounter (Signed)
Order has been updated, authorization has been taken care of, pt is scehduled for tonight at 930pm at [facility]

## 2019-02-10 NOTE — Telephone Encounter (Signed)
Pt called stating he went for his MRI on Saturday but was turned away because he thought he was suppose to get MRI of R thigh and instead the order was for Left thigh. Pt states they would not go ahead and just do it so he had to leave. Pt wants to know what he needs to do? Pt would like to see if can get the right order in for him for the R thigh. Please advise.

## 2019-02-10 NOTE — Telephone Encounter (Signed)
Hey! This patient has a new order for an MRI right thigh. I ordered it STAT since it was our mistake. After I entered the order and called patient he wants it done at [facility]. I wasn't sure how to go back and change that in the referral. He said they are saving him a spot this afternoon so I wanted to let you know ASAP. Thanks!!!

## 2019-02-11 ENCOUNTER — Encounter: Payer: Self-pay | Admitting: Orthopaedic Surgery

## 2019-02-12 NOTE — Telephone Encounter (Signed)
Would like to see Eddie Young in the office

## 2019-02-13 ENCOUNTER — Other Ambulatory Visit: Payer: Self-pay

## 2019-02-13 ENCOUNTER — Ambulatory Visit (INDEPENDENT_AMBULATORY_CARE_PROVIDER_SITE_OTHER): Payer: BLUE CROSS/BLUE SHIELD | Admitting: Orthopaedic Surgery

## 2019-02-13 ENCOUNTER — Encounter: Payer: Self-pay | Admitting: Orthopaedic Surgery

## 2019-02-13 DIAGNOSIS — M79651 Pain in right thigh: Secondary | ICD-10-CM | POA: Insufficient documentation
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
## 2019-03-12 ENCOUNTER — Other Ambulatory Visit: Payer: Self-pay

## 2019-03-12 ENCOUNTER — Ambulatory Visit (INDEPENDENT_AMBULATORY_CARE_PROVIDER_SITE_OTHER): Payer: Medicare Other | Admitting: Orthopaedic Surgery

## 2019-03-12 ENCOUNTER — Encounter: Payer: Self-pay | Admitting: Orthopaedic Surgery

## 2019-03-12 VITALS — Ht 71.0 in | Wt 195.0 lb

## 2019-03-12 DIAGNOSIS — M79651 Pain in right thigh: Secondary | ICD-10-CM | POA: Diagnosis not present

## 2019-03-12 MED ORDER — METHYLPREDNISOLONE ACETATE 40 MG/ML IJ SUSP
80.0000 mg | INTRAMUSCULAR | Status: AC | PRN
  Administered 2019-03-12: 80 mg via INTRA_ARTICULAR

## 2019-03-12 MED ORDER — LIDOCAINE HCL 2 % IJ SOLN
2.0000 mL | INTRAMUSCULAR | Status: AC | PRN
  Administered 2019-03-12: 2 mL

## 2019-03-12 MED ORDER — BUPIVACAINE HCL 0.5 % IJ SOLN
2.0000 mL | INTRAMUSCULAR | Status: AC | PRN
  Administered 2019-03-12: 2 mL via INTRA_ARTICULAR

## 2019-03-12 NOTE — Progress Notes (Signed)
Office Visit Note   Patient: Eddie Young           Date of Birth: 1945-05-21           MRN: [id number] Visit Date: 03/12/2019              Requested by: Stephens Shire, MD [address] [place],  [place] [address] PCP: Stephens Shire, MD   Assessment & Plan: Visit Diagnoses:  1. Right thigh pain     Plan: Posterior right thigh hematoma appears to be resolving and relatively quickly.  He still has a mass but it is at least 50% smaller than it was when I saw him recently.  No neurologic deficit.  I suspect this will just resolve over time.  He can increase his activity as tolerated.  I like to see him again in about a month.  He will let me know if there are any problems in the interim. He is experiencing recurrent impingement symptoms of his left shoulder.  I will inject the subacromial space with cortisone.  We can always consider an MRI scan if it becomes more compromising  Follow-Up Instructions: Return in about 1 month (around 04/12/2019).   Orders:  No orders of the defined types were placed in this encounter.  No orders of the defined types were placed in this encounter.     Procedures: Large Joint Inj: L subacromial bursa on 03/12/2019 12:41 PM Indications: pain and diagnostic evaluation Details: 25 G 1.5 in needle, anterolateral approach  Arthrogram: No  Medications: 2 mL lidocaine 2 %; 2 mL bupivacaine 0.5 %; 80 mg methylPREDNISolone acetate 40 MG/ML Consent was given by the patient. Immediately prior to procedure a time out was called to verify the correct patient, procedure, equipment, support staff and site/side marked as required. Patient was prepped and draped in the usual sterile fashion.       Clinical Data: No additional findings.   Subjective: Chief Complaint  Patient presents with  . Right Leg - Follow-up  Patient presents today for follow up on his right thigh pain. He said that it is still improving, but just not 100% better.  He does not have to take anything for pain. He also wants to have his left shoulder injected today. He had his left shoulder injected with cortisone in October of last year. He said that it was great and just started hurting again in the last two weeks.   HPI  Review of Systems   Objective: Vital Signs: Ht 5\' 11"  (1.803 m)   Wt 195 lb (88.5 kg)   BMI 27.20 kg/m   Physical Exam Constitutional:      Appearance: He is well-developed.  Eyes:     Pupils: Pupils are equal, round, and reactive to light.  Pulmonary:     Effort: Pulmonary effort is normal.  Skin:    General: Skin is warm and dry.  Neurological:     Mental Status: He is alert and oriented to person, place, and time.  Psychiatric:        Behavior: Behavior normal.     Ortho Exam left shoulder with full overhead motion.  Biceps intact.  Skin intact.  Good grip and good release.  Very mild pain with impingement testing external rotation.  Good strength.  No crepitation.  Some mild discomfort along the anterior subacromial region Right thigh mass is much less indurated and more localized at the junction of the proximal and  middle thirds of the thigh.  Skin intact.  No pain.  Neurologically intact.  Specialty Comments:  No specialty comments available.  Imaging: No results found.   PMFS History: Patient Active Problem List   Diagnosis Date Noted  . Right thigh pain 02/13/2019  . Pain in left shoulder 10/28/2018  . Primary osteoarthritis of both knees 01/15/2017   Past Medical History:  Diagnosis Date  . GERD (gastroesophageal reflux disease)   . Hyperlipidemia   . Macular degeneration   . Thyroid disease    hyper active    Family History  Problem Relation Age of Onset  . Thyroid cancer Maternal Grandmother   . Colon cancer Neg Hx     Past Surgical History:  Procedure Laterality Date  . ANKLE ARTHROSCOPY WITH REPAIR SUBLUXING TENDON    . KNEE CARTILAGE SURGERY    . SHOULDER ARTHROSCOPY W/ ROTATOR CUFF  REPAIR     Social History   Occupational History  . Not on file  Tobacco Use  . Smoking status: Never Smoker  . Smokeless tobacco: Never Used  Substance and Sexual Activity  . Alcohol use: Yes    Alcohol/week: 3.0 standard drinks    Types: 3 Cans of beer per week  . Drug use: No  . Sexual activity: Not on file

## 2019-03-13 ENCOUNTER — Ambulatory Visit: Payer: Medicare Other | Attending: Internal Medicine

## 2019-03-13 DIAGNOSIS — Z23 Encounter for immunization: Secondary | ICD-10-CM | POA: Insufficient documentation

## 2019-03-13 NOTE — Progress Notes (Signed)
   Covid-19 Vaccination Clinic  Name:  Eddie Young    MRN: [id number] DOB: 1945/11/06  03/13/2019  Eddie Young was observed post Covid-19 immunization for 15 minutes without incident. He was provided with Vaccine Information Sheet and instruction to access the V-Safe system.   Eddie Young was instructed to call 911 with any severe reactions post vaccine: Marland Kitchen Difficulty breathing  . Swelling of face and throat  . A fast heartbeat  . A bad rash all over body  . Dizziness and weakness   Immunizations Administered    Name Date Dose VIS Date Route   Pfizer COVID-19 Vaccine 03/13/2019  9:02 AM 0.3 mL 12/20/2018 Intramuscular   Manufacturer: [place]   Lot: [id number]   [place]: [id number]

## 2019-04-08 ENCOUNTER — Ambulatory Visit: Payer: Medicare Other | Attending: Internal Medicine

## 2019-04-08 DIAGNOSIS — Z23 Encounter for immunization: Secondary | ICD-10-CM

## 2019-04-08 NOTE — Progress Notes (Signed)
   Covid-19 Vaccination Clinic  Name:  Eddie Young    MRN: [id number] DOB: 10/31/1945  04/08/2019  [name] was observed post Covid-19 immunization for 15 minutes without incident. He was provided with Vaccine Information Sheet and instruction to access the V-Safe system.   [name] was instructed to call 911 with any severe reactions post vaccine: Marland Kitchen Difficulty breathing  . Swelling of face and throat  . A fast heartbeat  . A bad rash all over body  . Dizziness and weakness   Immunizations Administered    Name Date Dose VIS Date Route   Pfizer COVID-19 Vaccine 04/08/2019  1:10 PM 0.3 mL 12/20/2018 Intramuscular   Manufacturer: [place]   Lot: [id number]   [place]: [id number]

## 2019-05-06 ENCOUNTER — Other Ambulatory Visit: Payer: Self-pay

## 2019-05-06 ENCOUNTER — Telehealth: Payer: Self-pay | Admitting: Orthopaedic Surgery

## 2019-05-06 DIAGNOSIS — G8929 Other chronic pain: Secondary | ICD-10-CM

## 2019-05-06 DIAGNOSIS — M25512 Pain in left shoulder: Secondary | ICD-10-CM

## 2019-05-06 NOTE — Telephone Encounter (Signed)
Patient would like to go ahead ans set up the mri for the left shoulder( still having a great amount of pain).

## 2019-05-06 NOTE — Telephone Encounter (Signed)
Ordered

## 2019-05-06 NOTE — Telephone Encounter (Signed)
Ok to schedule.

## 2019-05-06 NOTE — Telephone Encounter (Signed)
Please advise 

## 2019-06-04 ENCOUNTER — Ambulatory Visit
Admission: RE | Admit: 2019-06-04 | Discharge: 2019-06-04 | Disposition: A | Discharge status: Home / Self Care | Payer: Medicare Other | Source: Ambulatory Visit | Attending: Orthopaedic Surgery | Admitting: Orthopaedic Surgery

## 2019-06-04 ENCOUNTER — Other Ambulatory Visit: Payer: Self-pay

## 2019-06-04 DIAGNOSIS — G8929 Other chronic pain: Secondary | ICD-10-CM

## 2019-06-07 ENCOUNTER — Other Ambulatory Visit: Payer: Medicare Other

## 2019-06-10 ENCOUNTER — Encounter: Payer: Self-pay | Admitting: Orthopaedic Surgery

## 2019-06-10 ENCOUNTER — Other Ambulatory Visit: Payer: Self-pay

## 2019-06-10 ENCOUNTER — Ambulatory Visit (INDEPENDENT_AMBULATORY_CARE_PROVIDER_SITE_OTHER): Payer: Medicare Other | Admitting: Orthopaedic Surgery

## 2019-06-10 VITALS — Ht 71.0 in | Wt 195.0 lb

## 2019-06-10 DIAGNOSIS — M25512 Pain in left shoulder: Secondary | ICD-10-CM | POA: Diagnosis not present

## 2019-06-10 DIAGNOSIS — G8929 Other chronic pain: Secondary | ICD-10-CM | POA: Diagnosis not present

## 2019-06-10 NOTE — Progress Notes (Signed)
Office Visit Note   Patient: Eddie Young           Date of Birth: [birth year]           MRN: [id number] Visit Date: 06/10/2019              Requested by: Stephens Shire, MD [address] [place],  [place] [address] PCP: Stephens Shire, MD   Assessment & Plan: Visit Diagnoses:  1. Chronic left shoulder pain     Plan: MRI scan demonstrates a large full-thickness retracted supraspinatus tendon tear of approximately 4 cm.  The infraspinatus and subscap tendons are intact.  There was moderate tendinopathy involving the upper fibers of the subscapularis tendon but the biceps long head was intact.  There was mild to moderate fatty atrophy of the supraspinatus muscle.  Type II acromium no labral tears or acute bony abnormalities.  Long discussion with Eddie Young regarding the above.  I would suggest an arthroscopic SCD, DCR and mini open rotator cuff tear repair.  I might use a patch discussed the surgery with him and he would like to proceed.  He has had a similar procedure years ago on the right shoulder and has done well.  I have discussed the outpatient nature, anesthesia, use of a sling, therapy and potential pain relief. Follow-Up Instructions: Return We will schedule rotator cuff tear repair left shoulder.   Orders:  No orders of the defined types were placed in this encounter.  No orders of the defined types were placed in this encounter.     Procedures: No procedures performed   Clinical Data: No additional findings.   Subjective: Chief Complaint  Patient presents with  . Left Shoulder - Follow-up    MRI review  Patient presents today for follow up on his left shoulder. He had an MRI on 06-04-2019 and is here today to discuss those results.  HPI  Review of Systems  Constitutional: Negative for fatigue.  HENT: Negative for ear pain.   Eyes: Negative for pain.  Respiratory: Negative for shortness of breath.   Cardiovascular: Negative for leg  swelling.  Gastrointestinal: Negative for constipation and diarrhea.  Endocrine: Negative for cold intolerance and heat intolerance.  Genitourinary: Negative for difficulty urinating.  Musculoskeletal: Negative for joint swelling.  Skin: Negative for rash.  Allergic/Immunologic: Negative for food allergies.  Neurological: Negative for weakness.  Hematological: Does not bruise/bleed easily.  Psychiatric/Behavioral: Negative for sleep disturbance.     Objective: Vital Signs: Ht 5\' 11"  (1.803 m)   Wt 195 lb (88.5 kg)   BMI 27.20 kg/m   Physical Exam Constitutional:      Appearance: He is well-developed.  Eyes:     Pupils: Pupils are equal, round, and reactive to light.  Pulmonary:     Effort: Pulmonary effort is normal.  Skin:    General: Skin is warm and dry.  Neurological:     Mental Status: He is alert and oriented to person, place, and time.  Psychiatric:        Behavior: Behavior normal.     Ortho Exam awake alert and oriented x3.  Comfortable sitting.  Has adhesive capsulitis left shoulder as I can only abduct about 80 degrees and flex about 100 degrees.  He does have limited external rotation consistent with adhesive capsulitis.  There is pain in the subacromial region anteriorly and laterally with flexion to about 90 degrees.  Biceps intact.  Skin intact.  Neurologically intact  Specialty Comments:  No specialty comments available.  Imaging: No results found.   PMFS History: Patient Active Problem List   Diagnosis Date Noted  . Right thigh pain 02/13/2019  . Pain in left shoulder 10/28/2018  . Primary osteoarthritis of both knees 01/15/2017   Past Medical History:  Diagnosis Date  . GERD (gastroesophageal reflux disease)   . Hyperlipidemia   . Macular degeneration   . Thyroid disease    hyper active    Family History  Problem Relation Age of Onset  . Thyroid cancer Maternal Grandmother   . Colon cancer Neg Hx     Past Surgical History:  Procedure  Laterality Date  . ANKLE ARTHROSCOPY WITH REPAIR SUBLUXING TENDON    . KNEE CARTILAGE SURGERY    . SHOULDER ARTHROSCOPY W/ ROTATOR CUFF REPAIR     Social History   Occupational History  . Not on file  Tobacco Use  . Smoking status: Never Smoker  . Smokeless tobacco: Never Used  Substance and Sexual Activity  . Alcohol use: Yes    Alcohol/week: 3.0 standard drinks    Types: 3 Cans of beer per week  . Drug use: No  . Sexual activity: Not on file

## 2019-07-17 ENCOUNTER — Inpatient Hospital Stay: Payer: Medicare Other | Admitting: Orthopaedic Surgery

## 2019-08-21 ENCOUNTER — Telehealth: Payer: Self-pay | Admitting: Orthopaedic Surgery

## 2019-08-21 ENCOUNTER — Encounter: Payer: Self-pay | Admitting: Orthopaedic Surgery

## 2019-08-21 ENCOUNTER — Other Ambulatory Visit: Payer: Self-pay | Admitting: Orthopedic Surgery

## 2019-08-21 DIAGNOSIS — M7522 Bicipital tendinitis, left shoulder: Secondary | ICD-10-CM | POA: Diagnosis not present

## 2019-08-21 DIAGNOSIS — M75122 Complete rotator cuff tear or rupture of left shoulder, not specified as traumatic: Secondary | ICD-10-CM

## 2019-08-21 DIAGNOSIS — M19012 Primary osteoarthritis, left shoulder: Secondary | ICD-10-CM | POA: Diagnosis not present

## 2019-08-21 DIAGNOSIS — M7502 Adhesive capsulitis of left shoulder: Secondary | ICD-10-CM

## 2019-08-21 DIAGNOSIS — M7542 Impingement syndrome of left shoulder: Secondary | ICD-10-CM | POA: Diagnosis not present

## 2019-08-21 MED ORDER — OXYCODONE HCL 5 MG PO CAPS: 5.0000 mg | ORAL_CAPSULE | ORAL | 0 refills | Status: DC | PRN

## 2019-08-21 NOTE — Telephone Encounter (Signed)
Patient's wife Lelon Frohlich called and patient needs a 10 day post op follow up. Please call patient and please open slot for patient. Please call patient at [phone]

## 2019-08-21 NOTE — Telephone Encounter (Signed)
Patient's wife returned call to [employer]. Please give her a call back at [phone]

## 2019-08-21 NOTE — Telephone Encounter (Signed)
Tried to call patient. No answer. No voicemail.

## 2019-08-25 NOTE — Telephone Encounter (Signed)
Spoke with patient's wife and scheduled appt

## 2019-08-26 ENCOUNTER — Other Ambulatory Visit: Payer: Self-pay

## 2019-08-26 ENCOUNTER — Encounter: Payer: Self-pay | Admitting: Orthopedic Surgery

## 2019-08-26 ENCOUNTER — Ambulatory Visit (INDEPENDENT_AMBULATORY_CARE_PROVIDER_SITE_OTHER): Payer: Medicare Other | Admitting: Orthopedic Surgery

## 2019-08-26 DIAGNOSIS — M19019 Primary osteoarthritis, unspecified shoulder: Secondary | ICD-10-CM | POA: Insufficient documentation

## 2019-08-26 DIAGNOSIS — M19012 Primary osteoarthritis, left shoulder: Secondary | ICD-10-CM

## 2019-08-26 DIAGNOSIS — M7542 Impingement syndrome of left shoulder: Secondary | ICD-10-CM | POA: Insufficient documentation

## 2019-08-26 DIAGNOSIS — M7522 Bicipital tendinitis, left shoulder: Secondary | ICD-10-CM

## 2019-08-26 DIAGNOSIS — M75122 Complete rotator cuff tear or rupture of left shoulder, not specified as traumatic: Secondary | ICD-10-CM | POA: Insufficient documentation

## 2019-08-26 NOTE — Progress Notes (Signed)
Office Visit Note   Patient: Eddie Young           Date of Birth: 10/15/1945           MRN: [id number] Visit Date: 08/26/2019              Requested by: Stephens Shire, MD [address] [place],  [place] [address] PCP: Stephens Shire, MD   Assessment & Plan: Visit Diagnoses:  1. Complete tear of left rotator cuff, unspecified whether traumatic   2. Arthritis of left acromioclavicular joint   3. Impingement syndrome of left shoulder   4. Biceps tendonitis on left     Plan:  #1: At this time his dressing was removed.  His wounds were clean and dry.  Steri-Strips were replaced over the rotator cuff incision.  Placed back into his immobilizer #2: Instructed him in some pendulum exercises and not to use the muscles but swinging of his body for his motion.   Follow-Up Instructions: Return in about 1 week (around 09/02/2019).   Orders:  No orders of the defined types were placed in this encounter.  No orders of the defined types were placed in this encounter.     Procedures: No procedures performed   Clinical Data: No additional findings.   Subjective: Chief Complaint  Patient presents with  . Left Shoulder - Routine Post Op    Left shoulder scope 08/21/2019  Patient presents today for follow up on his left shoulder. He had a left shoulder arthroscopy with DCR, SAD, and rotator cuff tear repair on 08/21/2019. He is now 5 days out from surgery. Patient states that he is not having any pain. He is taking Advil and wearing his sling.   HPI  Review of Systems   Objective: Vital Signs: Ht 5\' 11"  (1.803 m)   Wt 195 lb (88.5 kg)   BMI 27.20 kg/m   Physical Exam  Ortho Exam  Exam today reveals his wounds to be healing per primam no signs of infection.  He is neurovascular intact distally.  Good radial pulse.  Specialty Comments:  No specialty comments available.  Imaging: No results found.   PMFS History: Current Outpatient Medications    Medication Sig Dispense Refill  . aspirin 325 MG tablet Take 325 mg by mouth daily.    . calcium carbonate (OS-CAL) 600 MG TABS tablet Take 600 mg by mouth daily with breakfast.    . esomeprazole (NEXIUM) 40 MG capsule Take 40 mg by mouth daily at 12 noon.    Marland Kitchen GLUCOSAMINE HCL PO Take 2,000 mg by mouth daily.    . methimazole (TAPAZOLE) 5 MG tablet Take 5 mg by mouth daily.    . Multiple Vitamins-Minerals (ICAPS) TABS Take by mouth.    . Omega-3 Fatty Acids (FISH OIL) 1000 MG CAPS Take 1 capsule by mouth daily.    Marland Kitchen oxycodone (OXY-IR) 5 MG capsule Take 1 capsule (5 mg total) by mouth every 4 (four) hours as needed. 30 capsule 0  . rosuvastatin (CRESTOR) 20 MG tablet Take 10 mg by mouth daily.    . Saw Palmetto 450 MG CAPS Take 1 capsule by mouth daily.    . vitamin E (VITAMIN E) 400 UNIT capsule Take 400 Units by mouth daily.     No current facility-administered medications for this visit.    Patient Active Problem List   Diagnosis Date Noted  . Complete tear of left rotator cuff 08/26/2019  .  AC (acromioclavicular) arthritis 08/26/2019  . Impingement syndrome of left shoulder 08/26/2019  . Biceps tendonitis on left 08/26/2019  . Right thigh pain 02/13/2019  . Pain in left shoulder 10/28/2018  . Primary osteoarthritis of both knees 01/15/2017   Past Medical History:  Diagnosis Date  . GERD (gastroesophageal reflux disease)   . Hyperlipidemia   . Macular degeneration   . Thyroid disease    hyper active    Family History  Problem Relation Age of Onset  . Thyroid cancer Maternal Grandmother   . Colon cancer Neg Hx     Past Surgical History:  Procedure Laterality Date  . ANKLE ARTHROSCOPY WITH REPAIR SUBLUXING TENDON    . KNEE CARTILAGE SURGERY    . SHOULDER ARTHROSCOPY W/ ROTATOR CUFF REPAIR     Social History   Occupational History  . Not on file  Tobacco Use  . Smoking status: Never Smoker  . Smokeless tobacco: Never Used  Vaping Use  . Vaping Use: Never used   Substance and Sexual Activity  . Alcohol use: Yes    Alcohol/week: 3.0 standard drinks    Types: 3 Cans of beer per week  . Drug use: No  . Sexual activity: Not on file

## 2019-09-01 ENCOUNTER — Telehealth: Payer: Self-pay | Admitting: Orthopaedic Surgery

## 2019-09-01 NOTE — Telephone Encounter (Signed)
Patient called.   He said he was told to come in this week to see Dr.Whitfield. I made him aware that there was no availability so I would have to get his providers to schedule it.  Call back: [phone]

## 2019-09-01 NOTE — Telephone Encounter (Signed)
Called and spoke with patient. Scheduled for this week.

## 2019-09-04 ENCOUNTER — Other Ambulatory Visit: Payer: Self-pay

## 2019-09-04 ENCOUNTER — Ambulatory Visit (INDEPENDENT_AMBULATORY_CARE_PROVIDER_SITE_OTHER): Payer: Medicare Other | Admitting: Orthopaedic Surgery

## 2019-09-04 ENCOUNTER — Encounter: Payer: Self-pay | Admitting: Orthopaedic Surgery

## 2019-09-04 VITALS — Ht 71.0 in | Wt 195.0 lb

## 2019-09-04 DIAGNOSIS — M75122 Complete rotator cuff tear or rupture of left shoulder, not specified as traumatic: Secondary | ICD-10-CM

## 2019-09-04 NOTE — Progress Notes (Signed)
Office Visit Note   Patient: Eddie Young           Date of Birth: 02-Apr-1945           MRN: [id number] Visit Date: 09/04/2019              Requested by: Stephens Shire, MD [address] [place],  [place] [address] PCP: Stephens Shire, MD   Assessment & Plan: Visit Diagnoses:  1. Complete tear of left rotator cuff, unspecified whether traumatic     Plan: 2 weeks status post massive rotator cuff tear repair involving predominately supraspinatus but some portion of the infraspinatus.  The tear was in a V shape.  I was able to repair at least half of it from the superior towards the inferior direction but still had a small gap near the humeral head attachment.  I applied a derma span patch.  I also performed a biceps tenodesis.  He is doing exceptionally well and not having any pain.  Wearing a sling.  Long discussion regarding his diagnosis and treatment and what he may expect over time.  He did have adhesive capsulitis noted at the time of surgery which I released by manipulation and he might develop that again postoperatively as were going to limit his motion to just passive for at least 6 weeks.  We will plan to see him back in a month and start therapy  Follow-Up Instructions: Return in about 1 month (around 10/05/2019).   Orders:  Orders Placed This Encounter  Procedures   Ambulatory referral to Physical Therapy   No orders of the defined types were placed in this encounter.     Procedures: No procedures performed   Clinical Data: No additional findings.   Subjective: Chief Complaint  Patient presents with   Left Shoulder - Follow-up, Routine Post Op    Left shoulder arthroscopy 08/21/2019  Patient presents today for follow up on his left shoulder. He had a left shoulder arthroscopy on 08/21/2019. He is now 2 weeks out from surgery. Patient states that he has not experienced any pain at all since surgery. He is wearing his sling.   HPI  Review  of Systems   Objective: Vital Signs: Ht 5\' 11"  (1.803 m)    Wt 195 lb (88.5 kg)    BMI 27.20 kg/m   Physical Exam  Ortho Exam incisions left shoulder healing without any problem.  Good grip and release.  Neurologically intact.  Did not attempt range of motion.  Not having any localized areas of tenderness.  Specialty Comments:  No specialty comments available.  Imaging: No results found.   PMFS History: Patient Active Problem List   Diagnosis Date Noted   Complete tear of left rotator cuff 08/26/2019   AC (acromioclavicular) arthritis 08/26/2019   Impingement syndrome of left shoulder 08/26/2019   Biceps tendonitis on left 08/26/2019   Right thigh pain 02/13/2019   Pain in left shoulder 10/28/2018   Primary osteoarthritis of both knees 01/15/2017   Past Medical History:  Diagnosis Date   GERD (gastroesophageal reflux disease)    Hyperlipidemia    Macular degeneration    Thyroid disease    hyper active    Family History  Problem Relation Age of Onset   Thyroid cancer Maternal Grandmother    Colon cancer Neg Hx     Past Surgical History:  Procedure Laterality Date   ANKLE ARTHROSCOPY WITH REPAIR SUBLUXING TENDON  KNEE CARTILAGE SURGERY     SHOULDER ARTHROSCOPY W/ ROTATOR CUFF REPAIR     Social History   Occupational History   Not on file  Tobacco Use   Smoking status: Never Smoker   Smokeless tobacco: Never Used  Vaping Use   Vaping Use: Never used  Substance and Sexual Activity   Alcohol use: Yes    Alcohol/week: 3.0 standard drinks    Types: 3 Cans of beer per week   Drug use: No   Sexual activity: Not on file

## 2019-09-17 ENCOUNTER — Ambulatory Visit: Payer: Medicare Other | Admitting: Physical Therapy

## 2019-09-18 ENCOUNTER — Encounter: Payer: Self-pay | Admitting: Physical Therapy

## 2019-09-18 ENCOUNTER — Other Ambulatory Visit: Payer: Self-pay

## 2019-09-18 ENCOUNTER — Ambulatory Visit (INDEPENDENT_AMBULATORY_CARE_PROVIDER_SITE_OTHER): Payer: Medicare Other | Admitting: Physical Therapy

## 2019-09-18 DIAGNOSIS — M25512 Pain in left shoulder: Secondary | ICD-10-CM | POA: Diagnosis not present

## 2019-09-18 DIAGNOSIS — M25612 Stiffness of left shoulder, not elsewhere classified: Secondary | ICD-10-CM | POA: Diagnosis not present

## 2019-09-18 DIAGNOSIS — R600 Localized edema: Secondary | ICD-10-CM

## 2019-09-18 DIAGNOSIS — M6281 Muscle weakness (generalized): Secondary | ICD-10-CM

## 2019-09-18 NOTE — Therapy (Signed)
[facility] Physical Therapy [address] [place], [place], [address] Phone: [phone]   Fax:  [phone]  Physical Therapy Evaluation  Patient Details  Name: Eddie Young MRN: [id number] Date of Birth: 10/26/1945 Referring Provider (PT): Durward Fortes   Encounter Date: 09/18/2019   PT End of Session - 09/18/19 1531    Visit Number 1    Number of Visits 24    Date for PT Re-Evaluation 12/11/19    Progress Note Due on Visit 10    PT Start Time 1430    PT Stop Time 1515    PT Time Calculation (min) 45 min    Activity Tolerance Patient tolerated treatment well    Behavior During Therapy [facility] for tasks assessed/performed           Past Medical History:  Diagnosis Date  . GERD (gastroesophageal reflux disease)   . Hyperlipidemia   . Macular degeneration   . Thyroid disease    hyper active    Past Surgical History:  Procedure Laterality Date  . ANKLE ARTHROSCOPY WITH REPAIR SUBLUXING TENDON    . KNEE CARTILAGE SURGERY    . SHOULDER ARTHROSCOPY W/ ROTATOR CUFF REPAIR      There were no vitals filed for this visit.    Subjective Assessment - 09/18/19 1522    Subjective he is S/P Lt shoulder massive RTC repair with graft, biceps tenodesis 08/21/19. he relays no pain currently and is still using sling.    Pertinent History MD plan/assessment: 2 weeks status post massive rotator cuff tear repair involving predominately supraspinatus but some portion of the infraspinatus.  The tear was in a V shape.  I was able to repair at least half of it from the superior towards the inferior direction but still had a small gap near the humeral head attachment.  I applied a derma span patch.  I also performed a biceps tenodesis.  He is doing exceptionally well and not having any pain.  Wearing a sling.  Long discussion regarding his diagnosis and treatment and what he may expect over time.  He did have adhesive capsulitis noted at the time of surgery which I released by  manipulation and he might develop that again postoperatively as were going to limit his motion to just passive for at least 6 weeks.    Limitations Writing;House hold activities;Lifting    Patient Stated Goals get my shoulder back to normal    Currently in Pain? No/denies              [facility] PT Assessment - 09/18/19 0001      Assessment   Medical Diagnosis S/P Lt shoulder massive RTC repair with graft, biceps tenodesis    Referring Provider (PT) [place]    Onset Date/Surgical Date 08/21/19    Hand Dominance Right    Next MD Visit 03/09/19    Prior Therapy none      Restrictions   Other Position/Activity Restrictions PROM X 6 weeks, no resisted bicep for 6 weeks      Balance Screen   Has the patient fallen in the past 6 months No    Has the patient had a decrease in activity level because of a fear of falling?  No    Is the patient reluctant to leave their home because of a fear of falling?  No      Home Ecologist residence      Prior Function   Level of Independence Independent  Vocation Part time employment    Psychologist, forensic    Leisure read, walk, yardwork      Cognition   Overall Cognitive Status Within Functional Limits for tasks assessed      Observation/Other Assessments   Observations incision site looks well healing, no signs of infection      Observation/Other Assessments-Edema    Edema --   mild edema in Lt shoulder     Sensation   Light Touch Appears Intact      ROM / Strength   AROM / PROM / Strength PROM;Strength      PROM   PROM Assessment Site Shoulder    Right/Left Shoulder Left    Left Shoulder Flexion 90 Degrees    Left Shoulder ABduction 80 Degrees    Left Shoulder Internal Rotation 40 Degrees    Left Shoulder External Rotation 30 Degrees      Strength   Overall Strength Comments not tested due to post op status                      Objective measurements completed on  examination: See above findings.       [facility] Adult PT Treatment/Exercise - 09/18/19 0001      Exercises   Exercises Shoulder      Shoulder Exercises: Supine   Flexion PROM;10 reps   hands clasped using Rt arm only to pull up left     Shoulder Exercises: Seated   Retraction 15 reps    Other Seated Exercises table slide PROM stretching for flexion, abd, ER 15 reps ea      Modalities   Modalities Vasopneumatic      Vasopneumatic   Number Minutes Vasopneumatic  10 minutes    Vasopnuematic Location  Shoulder    Vasopneumatic Pressure Medium    Vasopneumatic Temperature  34      Manual Therapy   Manual therapy comments Lt shoulder PROM gentle all planes to tolerance                  PT Education - 09/18/19 1529    Education Details HEP, POC, precautions    Person(s) Educated Patient    Methods Explanation;Demonstration;Verbal cues;Handout    Comprehension Verbalized understanding            PT Short Term Goals - 09/18/19 1539      PT SHORT TERM GOAL #1   Title Pt will be I and compliant with initial HEP.    Time 4    Status New    Target Date 10/16/19      PT SHORT TERM GOAL #2   Title Pt will improve Lt shoulder PROM flexion >120, abd >100, ER >50    Time 4    Period Weeks    Status New             PT Long Term Goals - 09/18/19 1541      PT LONG TERM GOAL #1   Title Pt will improve Lt shoulder AROM to [facility].    Time 12    Period Weeks    Status New    Target Date 12/11/19      PT LONG TERM GOAL #2   Title Pt will improve Lt shoulder strength to 4+ overall.    Time 12    Period Weeks    Status New      PT LONG TERM GOAL #3   Title Pt will be able to  perform usual ADL's and yardwork without complaints in his Lt shoulder.    Status New                  Plan - 09/18/19 1531    Clinical Impression Statement He is now 4 weeks S/P Lt shoulder massive RTC repair with graft, biceps tenodesis 08/21/19. He has restrictions of PROM X 6  weeks (from post op which was confirmed verbally with MD), no resisted bicep for 6 weeks post op. Overall he is doing well with pain but will benefit from skilled PT to address his functional impairments in strength, ROM, lifting, carrying, and UE use.    Personal Factors and Comorbidities Other   massive tear used patch to repair   Examination-Activity Limitations Carry;Lift;Reach Overhead    Examination-Participation Restrictions Cleaning;Driving;Laundry;Yard Work;Meal Prep    Stability/Clinical Decision Making Stable/Uncomplicated    Clinical Decision Making Low    Rehab Potential Good    PT Frequency 3x / week   2-3   PT Treatment/Interventions ADLs/Self Care Home Management;Cryotherapy;Electrical Stimulation;Moist Heat;Iontophoresis 4mg /ml Dexamethasone;Ultrasound;Neuromuscular re-education;Balance training;Therapeutic exercise;Therapeutic activities;Patient/family education;Manual techniques;Dry needling;Passive range of motion;Taping;Vasopneumatic Device    PT Next Visit Plan PROM only until 9/23    PT Home Exercise Plan Access Code: [email or web address]           Patient will benefit from skilled therapeutic intervention in order to improve the following deficits and impairments:  Decreased activity tolerance, Decreased mobility, Decreased range of motion, Decreased strength, Hypomobility, Increased edema, Impaired flexibility, Increased muscle spasms, Increased fascial restricitons, Impaired UE functional use, Pain  Visit Diagnosis: Muscle weakness (generalized)  Stiffness of left shoulder, not elsewhere classified  Acute pain of left shoulder  Localized edema     Problem List Patient Active Problem List   Diagnosis Date Noted  . Complete tear of left rotator cuff 08/26/2019  . AC (acromioclavicular) arthritis 08/26/2019  . Impingement syndrome of left shoulder 08/26/2019  . Biceps tendonitis on left 08/26/2019  . Right thigh pain 02/13/2019  . Pain in left shoulder 10/28/2018  .  Primary osteoarthritis of both knees 01/15/2017    [name] 09/18/2019, 3:45 PM  [facility] Physical Therapy [address] [place], [place], [address] Phone: [phone]   Fax:  [phone]  Name: Eddie Young MRN: [id number] Date of Birth: January 29, 1945

## 2019-09-18 NOTE — Patient Instructions (Signed)
Access Code: [email or web address] URL: [email or web address] Date: 09/18/2019 Prepared by: Elsie Ra  Exercises Circular Shoulder Pendulum with Table Support - 2 x daily - 6 x weekly - 1-2 sets - 10 reps Flexion-Extension Shoulder Pendulum with Table Support - 2 x daily - 6 x weekly - 1-2 sets - 10 reps Horizontal Shoulder Pendulum with Table Support - 2 x daily - 6 x weekly - 1-2 sets - 10 reps Seated Scapular Retraction - 2 x daily - 6 x weekly - 3 sets - 10 reps Seated Shoulder Abduction Towel Slide at Table Top - 2-3 x daily - 6 x weekly - 10 reps - 1-2 sets - 5 hold Seated Shoulder Flexion Towel Slide at Table Top - 2-3 x daily - 6 x weekly - 10 reps - 1-2 sets Seated Shoulder External Rotation PROM on Table - 2-3 x daily - 6 x weekly - 10 reps - 1-2 sets - 5 hold

## 2019-09-22 ENCOUNTER — Other Ambulatory Visit: Payer: Self-pay

## 2019-09-22 ENCOUNTER — Ambulatory Visit (INDEPENDENT_AMBULATORY_CARE_PROVIDER_SITE_OTHER): Payer: Medicare Other | Admitting: Physical Therapy

## 2019-09-22 ENCOUNTER — Ambulatory Visit: Payer: Medicare Other | Admitting: Rehabilitative and Restorative Service Providers"

## 2019-09-22 ENCOUNTER — Encounter: Payer: Self-pay | Admitting: Physical Therapy

## 2019-09-22 DIAGNOSIS — R600 Localized edema: Secondary | ICD-10-CM | POA: Diagnosis not present

## 2019-09-22 DIAGNOSIS — M25512 Pain in left shoulder: Secondary | ICD-10-CM

## 2019-09-22 DIAGNOSIS — M6281 Muscle weakness (generalized): Secondary | ICD-10-CM

## 2019-09-22 DIAGNOSIS — M25612 Stiffness of left shoulder, not elsewhere classified: Secondary | ICD-10-CM | POA: Diagnosis not present

## 2019-09-22 NOTE — Therapy (Signed)
[facility] Physical Therapy [address] [place], [place], [address] Phone: [phone]   Fax:  [phone]  Physical Therapy Treatment  Patient Details  Name: Eddie Young MRN: [id number] Date of Birth: 1945-04-01 Referring Provider (PT): Durward Fortes   Encounter Date: 09/22/2019   PT End of Session - 09/22/19 1544    Visit Number 2    Number of Visits 24    Date for PT Re-Evaluation 12/11/19    Progress Note Due on Visit 10    PT Start Time 2060    PT Stop Time 1554    PT Time Calculation (min) 39 min    Activity Tolerance Patient tolerated treatment well    Behavior During Therapy [facility] for tasks assessed/performed           Past Medical History:  Diagnosis Date  . GERD (gastroesophageal reflux disease)   . Hyperlipidemia   . Macular degeneration   . Thyroid disease    hyper active    Past Surgical History:  Procedure Laterality Date  . ANKLE ARTHROSCOPY WITH REPAIR SUBLUXING TENDON    . KNEE CARTILAGE SURGERY    . SHOULDER ARTHROSCOPY W/ ROTATOR CUFF REPAIR      There were no vitals filed for this visit.   Subjective Assessment - 09/22/19 1514    Subjective shoulder is doing well.  no complaints.    Pertinent History MD plan/assessment: 2 weeks status post massive rotator cuff tear repair involving predominately supraspinatus but some portion of the infraspinatus.  The tear was in a V shape.  I was able to repair at least half of it from the superior towards the inferior direction but still had a small gap near the humeral head attachment.  I applied a derma span patch.  I also performed a biceps tenodesis.  He is doing exceptionally well and not having any pain.  Wearing a sling.  Long discussion regarding his diagnosis and treatment and what he may expect over time.  He did have adhesive capsulitis noted at the time of surgery which I released by manipulation and he might develop that again postoperatively as were going to limit his motion to  just passive for at least 6 weeks.    Limitations Writing;House hold activities;Lifting    Patient Stated Goals get my shoulder back to normal    Currently in Pain? No/denies                             [facility] Adult PT Treatment/Exercise - 09/22/19 1518      Shoulder Exercises: Supine   Other Supine Exercises scap retraction 15 x 5 sec      Shoulder Exercises: Seated   Retraction 15 reps    Other Seated Exercises towel squeeze for grip strength x10 reps    Other Seated Exercises shoulder rolls backwards x 15 reps      Vasopneumatic   Number Minutes Vasopneumatic  10 minutes    Vasopnuematic Location  Shoulder    Vasopneumatic Pressure Medium    Vasopneumatic Temperature  34      Manual Therapy   Manual therapy comments Lt shoulder PROM gentle all planes to tolerance                    PT Short Term Goals - 09/18/19 1539      PT SHORT TERM GOAL #1   Title Pt will be I and compliant with initial HEP.  Time 4    Status New    Target Date 10/16/19      PT SHORT TERM GOAL #2   Title Pt will improve Lt shoulder PROM flexion >120, abd >100, ER >50    Time 4    Period Weeks    Status New             PT Long Term Goals - 09/18/19 1541      PT LONG TERM GOAL #1   Title Pt will improve Lt shoulder AROM to [facility].    Time 12    Period Weeks    Status New    Target Date 12/11/19      PT LONG TERM GOAL #2   Title Pt will improve Lt shoulder strength to 4+ overall.    Time 12    Period Weeks    Status New      PT LONG TERM GOAL #3   Title Pt will be able to perform usual ADL's and yardwork without complaints in his Lt shoulder.    Status New                 Plan - 09/22/19 1545    Clinical Impression Statement Pt tolerated session well today, needs min cues to relax due to guarding, but overall able to tolerate PROM well.  No goals met as only 2nd visit.    Personal Factors and Comorbidities Other   massive tear used patch to  repair   Examination-Activity Limitations Carry;Lift;Reach Overhead    Examination-Participation Restrictions Cleaning;Driving;Laundry;Yard Work;Meal Prep    Stability/Clinical Decision Making Stable/Uncomplicated    Rehab Potential Good    PT Frequency 3x / week   2-3   PT Treatment/Interventions ADLs/Self Care Home Management;Cryotherapy;Electrical Stimulation;Moist Heat;Iontophoresis 32m/ml Dexamethasone;Ultrasound;Neuromuscular re-education;Balance training;Therapeutic exercise;Therapeutic activities;Patient/family education;Manual techniques;Dry needling;Passive range of motion;Taping;Vasopneumatic Device    PT Next Visit Plan PROM only until 9/23    PT Home Exercise Plan Access Code: [email or web address]   Consulted and Agree with Plan of Care Patient           Patient will benefit from skilled therapeutic intervention in order to improve the following deficits and impairments:  Decreased activity tolerance, Decreased mobility, Decreased range of motion, Decreased strength, Hypomobility, Increased edema, Impaired flexibility, Increased muscle spasms, Increased fascial restricitons, Impaired UE functional use, Pain  Visit Diagnosis: Muscle weakness (generalized)  Stiffness of left shoulder, not elsewhere classified  Acute pain of left shoulder  Localized edema     Problem List Patient Active Problem List   Diagnosis Date Noted  . Complete tear of left rotator cuff 08/26/2019  . AC (acromioclavicular) arthritis 08/26/2019  . Impingement syndrome of left shoulder 08/26/2019  . Biceps tendonitis on left 08/26/2019  . Right thigh pain 02/13/2019  . Pain in left shoulder 10/28/2018  . Primary osteoarthritis of both knees 01/15/2017      SLaureen Abrahams PT, DPT 09/22/19 3:46 PM    [facility] [address] [address]Phone: [phone]  Fax:  [phone] Name: Eddie KANAANMRN: [id number] of Birth:  6Feb 25, 1947

## 2019-09-26 ENCOUNTER — Ambulatory Visit (INDEPENDENT_AMBULATORY_CARE_PROVIDER_SITE_OTHER): Payer: Medicare Other | Admitting: Rehabilitative and Restorative Service Providers"

## 2019-09-26 ENCOUNTER — Encounter: Payer: Self-pay | Admitting: Rehabilitative and Restorative Service Providers"

## 2019-09-26 ENCOUNTER — Other Ambulatory Visit: Payer: Self-pay

## 2019-09-26 DIAGNOSIS — M25512 Pain in left shoulder: Secondary | ICD-10-CM

## 2019-09-26 DIAGNOSIS — G8929 Other chronic pain: Secondary | ICD-10-CM

## 2019-09-26 DIAGNOSIS — M25612 Stiffness of left shoulder, not elsewhere classified: Secondary | ICD-10-CM | POA: Diagnosis not present

## 2019-09-26 DIAGNOSIS — M6281 Muscle weakness (generalized): Secondary | ICD-10-CM | POA: Diagnosis not present

## 2019-09-26 NOTE — Therapy (Signed)
[facility] Physical Therapy [address] [place], [place], [address] Phone: [phone]   Fax:  [phone]  Physical Therapy Treatment  Patient Details  Name: MARLOWE LAWES MRN: [id number] Date of Birth: [birth year] Referring Provider (PT): Durward Fortes   Encounter Date: 09/26/2019   PT End of Session - 09/26/19 1055    Visit Number 3    Number of Visits 24    Date for PT Re-Evaluation 12/11/19    Progress Note Due on Visit 10    PT Start Time 1005    PT Stop Time 1045    PT Time Calculation (min) 40 min    Activity Tolerance Patient tolerated treatment well;No increased pain    Behavior During Therapy [facility] for tasks assessed/performed           Past Medical History:  Diagnosis Date  . GERD (gastroesophageal reflux disease)   . Hyperlipidemia   . Macular degeneration   . Thyroid disease    hyper active    Past Surgical History:  Procedure Laterality Date  . ANKLE ARTHROSCOPY WITH REPAIR SUBLUXING TENDON    . KNEE CARTILAGE SURGERY    . SHOULDER ARTHROSCOPY W/ ROTATOR CUFF REPAIR      There were no vitals filed for this visit.   Subjective Assessment - 09/26/19 1052    Subjective Pike is sleeping well and is wearing his sling as prescribed.    Pertinent History MD plan/assessment: 2 weeks status post massive rotator cuff tear repair involving predominately supraspinatus but some portion of the infraspinatus.  The tear was in a V shape.  I was able to repair at least half of it from the superior towards the inferior direction but still had a small gap near the humeral head attachment.  I applied a derma span patch.  I also performed a biceps tenodesis.  He is doing exceptionally well and not having any pain.  Wearing a sling.  Long discussion regarding his diagnosis and treatment and what he may expect over time.  He did have adhesive capsulitis noted at the time of surgery which I released by manipulation and he might develop that again  postoperatively as were going to limit his motion to just passive for at least 6 weeks.    Limitations Writing;House hold activities;Lifting    Patient Stated Goals get my shoulder back to normal    Currently in Pain? No/denies                             [facility] Adult PT Treatment/Exercise - 09/26/19 0001      Shoulder Exercises: Supine   Other Supine Exercises scap retraction 15 x 5 sec      Shoulder Exercises: Seated   Retraction 15 reps      Shoulder Exercises: Standing   Other Standing Exercises Codmans F/B; CW; CCW 20X each      Vasopneumatic   Number Minutes Vasopneumatic  10 minutes    Vasopnuematic Location  Shoulder    Vasopneumatic Pressure Medium    Vasopneumatic Temperature  34      Manual Therapy   Manual therapy comments Lt shoulder PROM gentle all planes to tolerance                  PT Education - 09/26/19 1053    Education Details Reviewed HEP and discussed upping Codmans to 5X or more per day to address capsular tightness.    Person(s) Educated Patient  Methods Explanation;Demonstration;Tactile cues;Verbal cues    Comprehension Verbalized understanding;Returned demonstration;Verbal cues required;Need further instruction;Tactile cues required            PT Short Term Goals - 09/26/19 1054      PT SHORT TERM GOAL #1   Title Pt will be I and compliant with initial HEP.    Time 4    Status On-going    Target Date 10/16/19      PT SHORT TERM GOAL #2   Title Pt will improve Lt shoulder PROM flexion >120, abd >100, ER >50    Time 4    Period Weeks    Status On-going             PT Long Term Goals - 09/26/19 1054      PT LONG TERM GOAL #1   Title Pt will improve Lt shoulder AROM to [facility].    Time 12    Period Weeks    Status On-going      PT LONG TERM GOAL #2   Title Pt will improve Lt shoulder strength to 4+ overall.    Time 12    Period Weeks    Status On-going      PT LONG TERM GOAL #3   Title Pt will be  able to perform usual ADL's and yardwork without complaints in his Lt shoulder.    Status On-going                 Plan - 09/26/19 1055    Clinical Impression Statement Lc reports compliance with his HEP and sling use.  We discussed increasing Codmans compliance to at least 5X/day to help address remaining capsular stiffness.  Current emphasis remains PROM, AAROM and AROM.    Personal Factors and Comorbidities Other   massive tear used patch to repair   Examination-Activity Limitations Carry;Lift;Reach Overhead    Examination-Participation Restrictions Cleaning;Driving;Laundry;Yard Work;Meal Prep    Stability/Clinical Decision Making Stable/Uncomplicated    Rehab Potential Good    PT Frequency 3x / week   2-3   PT Treatment/Interventions ADLs/Self Care Home Management;Cryotherapy;Electrical Stimulation;Moist Heat;Iontophoresis 4mg /ml Dexamethasone;Ultrasound;Neuromuscular re-education;Balance training;Therapeutic exercise;Therapeutic activities;Patient/family education;Manual techniques;Dry needling;Passive range of motion;Taping;Vasopneumatic Device    PT Next Visit Plan PROM only until 9/23    PT Home Exercise Plan Access Code: [email or web address]    Consulted and Agree with Plan of Care Patient           Patient will benefit from skilled therapeutic intervention in order to improve the following deficits and impairments:  Decreased activity tolerance, Decreased mobility, Decreased range of motion, Decreased strength, Hypomobility, Increased edema, Impaired flexibility, Increased muscle spasms, Increased fascial restricitons, Impaired UE functional use, Pain  Visit Diagnosis: Muscle weakness (generalized)  Stiffness of left shoulder, not elsewhere classified  Chronic left shoulder pain     Problem List Patient Active Problem List   Diagnosis Date Noted  . Complete tear of left rotator cuff 08/26/2019  . AC (acromioclavicular) arthritis 08/26/2019  . Impingement syndrome of  left shoulder 08/26/2019  . Biceps tendonitis on left 08/26/2019  . Right thigh pain 02/13/2019  . Pain in left shoulder 10/28/2018  . Primary osteoarthritis of both knees 01/15/2017    Farley Ly PT, MPT 09/26/2019, 10:58 AM  [facility] Physical Therapy [address] [place], [place], [address] Phone: [phone]   Fax:  [phone]  Name: SATOSHI KALAS MRN: [id number] Date of Birth: [birth year]

## 2019-09-29 ENCOUNTER — Other Ambulatory Visit: Payer: Self-pay

## 2019-09-29 ENCOUNTER — Ambulatory Visit (INDEPENDENT_AMBULATORY_CARE_PROVIDER_SITE_OTHER): Payer: Medicare Other | Admitting: Physical Therapy

## 2019-09-29 ENCOUNTER — Encounter: Payer: Self-pay | Admitting: Physical Therapy

## 2019-09-29 DIAGNOSIS — M25612 Stiffness of left shoulder, not elsewhere classified: Secondary | ICD-10-CM

## 2019-09-29 DIAGNOSIS — M25512 Pain in left shoulder: Secondary | ICD-10-CM

## 2019-09-29 DIAGNOSIS — R600 Localized edema: Secondary | ICD-10-CM | POA: Diagnosis not present

## 2019-09-29 DIAGNOSIS — G8929 Other chronic pain: Secondary | ICD-10-CM

## 2019-09-29 DIAGNOSIS — M6281 Muscle weakness (generalized): Secondary | ICD-10-CM | POA: Diagnosis not present

## 2019-09-29 NOTE — Therapy (Signed)
[facility] [address] [place], [place], [address] Phone: [phone]   Fax:  [phone]  Physical Therapy Treatment  Patient Details  Name: Eddie Young MRN: [id number] Date of Birth: 06/21/1945 Referring Provider (PT): Durward Fortes   Encounter Date: 09/29/2019   PT End of Session - 09/29/19 0928    Visit Number 4    Number of Visits 24    Date for PT Re-Evaluation 12/11/19    Progress Note Due on Visit 10    PT Start Time 7062    PT Stop Time 0935    PT Time Calculation (min) 48 min    Activity Tolerance Patient tolerated treatment well;No increased pain    Behavior During Therapy WFL for tasks assessed/performed           Past Medical History:  Diagnosis Date  . GERD (gastroesophageal reflux disease)   . Hyperlipidemia   . Macular degeneration   . Thyroid disease    hyper active    Past Surgical History:  Procedure Laterality Date  . ANKLE ARTHROSCOPY WITH REPAIR SUBLUXING TENDON    . KNEE CARTILAGE SURGERY    . SHOULDER ARTHROSCOPY W/ ROTATOR CUFF REPAIR      There were no vitals filed for this visit.   Subjective Assessment - 09/29/19 0852    Subjective He relays his shoulder is feeling well.    Pertinent History MD plan/assessment: status post massive rotator cuff tear repair involving predominately supraspinatus but some portion of the infraspinatus.  The tear was in a V shape.  I was able to repair at least half of it from the superior towards the inferior direction but still had a small gap near the humeral head attachment.  I applied a derma span patch.  I also performed a biceps tenodesis.  He is doing exceptionally well and not having any pain.  Wearing a sling.  Long discussion regarding his diagnosis and treatment and what he may expect over time.  He did have adhesive capsulitis noted at the time of surgery which I released by manipulation and he might develop that again postoperatively as were going to limit his  motion to just passive for at least 6 weeks.    Limitations Writing;House hold activities;Lifting    Patient Stated Goals get my shoulder back to normal    Currently in Pain? No/denies                 [facility] Adult PT Treatment/Exercise - 09/29/19 0001      Shoulder Exercises: Seated   Retraction 20 reps      Shoulder Exercises: Pulleys   Other Pulley Exercises PROM pulleys using his Rt arm only to stretch Lt, 3 min flexion, 3 minutes scaption holding 5 sec      Shoulder Exercises: Stretch   Other Shoulder Stretches seated tableslides PROM with arms on pball using Rt arm only to roll ball 5 sec X 10 reps for flexion, scaption, ER (arm not on ball)      Vasopneumatic   Number Minutes Vasopneumatic  10 minutes    Vasopnuematic Location  Shoulder    Vasopneumatic Pressure Medium    Vasopneumatic Temperature  34      Manual Therapy   Manual therapy comments Lt shoulder PROM gentle all planes to tolerance                    PT Short Term Goals - 09/26/19 1054      PT SHORT  TERM GOAL #1   Title Pt will be I and compliant with initial HEP.    Time 4    Status On-going    Target Date 10/16/19      PT SHORT TERM GOAL #2   Title Pt will improve Lt shoulder PROM flexion >120, abd >100, ER >50    Time 4    Period Weeks    Status On-going             PT Long Term Goals - 09/26/19 1054      PT LONG TERM GOAL #1   Title Pt will improve Lt shoulder AROM to [facility].    Time 12    Period Weeks    Status On-going      PT LONG TERM GOAL #2   Title Pt will improve Lt shoulder strength to 4+ overall.    Time 12    Period Weeks    Status On-going      PT LONG TERM GOAL #3   Title Pt will be able to perform usual ADL's and yardwork without complaints in his Lt shoulder.    Status On-going                 Plan - 09/29/19 0930    Clinical Impression Statement PROM gently progressing to this tolerance. He is overall doing well with pain. He will see MD next  week for follow up so will update measurments then.    Personal Factors and Comorbidities Other   massive tear used patch to repair   Examination-Activity Limitations Carry;Lift;Reach Overhead    Examination-Participation Restrictions Cleaning;Driving;Laundry;Yard Work;Meal Prep    Stability/Clinical Decision Making Stable/Uncomplicated    Rehab Potential Good    PT Frequency 3x / week   2-3   PT Treatment/Interventions ADLs/Self Care Home Management;Cryotherapy;Electrical Stimulation;Moist Heat;Iontophoresis 4mg /ml Dexamethasone;Ultrasound;Neuromuscular re-education;Balance training;Therapeutic exercise;Therapeutic activities;Patient/family education;Manual techniques;Dry needling;Passive range of motion;Taping;Vasopneumatic Device    PT Next Visit Plan PROM only until 9/23    PT Home Exercise Plan Access Code: [email or web address]    Consulted and Agree with Plan of Care Patient           Patient will benefit from skilled therapeutic intervention in order to improve the following deficits and impairments:  Decreased activity tolerance, Decreased mobility, Decreased range of motion, Decreased strength, Hypomobility, Increased edema, Impaired flexibility, Increased muscle spasms, Increased fascial restricitons, Impaired UE functional use, Pain  Visit Diagnosis: Muscle weakness (generalized)  Stiffness of left shoulder, not elsewhere classified  Chronic left shoulder pain  Acute pain of left shoulder  Localized edema     Problem List Patient Active Problem List   Diagnosis Date Noted  . Complete tear of left rotator cuff 08/26/2019  . AC (acromioclavicular) arthritis 08/26/2019  . Impingement syndrome of left shoulder 08/26/2019  . Biceps tendonitis on left 08/26/2019  . Right thigh pain 02/13/2019  . Pain in left shoulder 10/28/2018  . Primary osteoarthritis of both knees 01/15/2017    Debbe Odea ,PT,DPT 09/29/2019, 9:31 AM  [facility] [address] [place], [place], [address] Phone: [phone]   Fax:  [phone]  Name: KAELON WEEKES MRN: [id number] Date of Birth: 1945-07-17

## 2019-10-02 ENCOUNTER — Encounter: Payer: Self-pay | Admitting: Physical Therapy

## 2019-10-02 ENCOUNTER — Ambulatory Visit (INDEPENDENT_AMBULATORY_CARE_PROVIDER_SITE_OTHER): Payer: Medicare Other | Admitting: Physical Therapy

## 2019-10-02 ENCOUNTER — Other Ambulatory Visit: Payer: Self-pay

## 2019-10-02 DIAGNOSIS — M6281 Muscle weakness (generalized): Secondary | ICD-10-CM | POA: Diagnosis not present

## 2019-10-02 DIAGNOSIS — M25612 Stiffness of left shoulder, not elsewhere classified: Secondary | ICD-10-CM | POA: Diagnosis not present

## 2019-10-02 DIAGNOSIS — R600 Localized edema: Secondary | ICD-10-CM

## 2019-10-02 DIAGNOSIS — M25512 Pain in left shoulder: Secondary | ICD-10-CM

## 2019-10-02 DIAGNOSIS — G8929 Other chronic pain: Secondary | ICD-10-CM

## 2019-10-02 NOTE — Patient Instructions (Signed)
Access Code: [email or web address] URL: [email or web address] Date: 10/02/2019 Prepared by: Faustino Congress  Exercises Seated Scapular Retraction - 2 x daily - 6 x weekly - 3 sets - 10 reps Seated Shoulder Abduction Towel Slide at Table Top - 2-3 x daily - 6 x weekly - 10 reps - 1-2 sets - 5 hold Seated Shoulder Flexion Towel Slide at Table Top - 2-3 x daily - 6 x weekly - 10 reps - 1-2 sets Seated Shoulder External Rotation PROM on Table - 2-3 x daily - 6 x weekly - 10 reps - 1-2 sets - 5 hold Supine Shoulder Flexion with Dowel - 2 x daily - 7 x weekly - 1 sets - 20 reps - 3-5 sec hold Supine Shoulder External Rotation AAROM with Dowel - 2 x daily - 7 x weekly - 1 sets - 20 reps Standing Shoulder Abduction ROM with Dowel - 2 x daily - 7 x weekly - 1 sets - 20 reps - 1-2 sec hold Standing Shoulder Extension with Dowel - 2 x daily - 7 x weekly - 1 sets - 20 reps

## 2019-10-02 NOTE — Therapy (Signed)
[facility] Therapy [address] [place], [place], [address] Phone: [phone]   Fax:  [phone]  Physical Therapy Treatment  Patient Details  Name: Eddie Young MRN: [id number] Date of Birth: [birth year] Referring Provider (PT): Durward Fortes   Encounter Date: 10/02/2019   PT End of Session - 10/02/19 1101    Visit Number 5    Number of Visits 24    Date for PT Re-Evaluation 12/11/19    Progress Note Due on Visit 10    PT Start Time 1013    PT Stop Time 1055    PT Time Calculation (min) 42 min    Activity Tolerance Patient tolerated treatment well;No increased pain    Behavior During Therapy WFL for tasks assessed/performed           Past Medical History:  Diagnosis Date  . GERD (gastroesophageal reflux disease)   . Hyperlipidemia   . Macular degeneration   . Thyroid disease    hyper active    Past Surgical History:  Procedure Laterality Date  . ANKLE ARTHROSCOPY WITH REPAIR SUBLUXING TENDON    . KNEE CARTILAGE SURGERY    . SHOULDER ARTHROSCOPY W/ ROTATOR CUFF REPAIR      There were no vitals filed for this visit.   Subjective Assessment - 10/02/19 1019    Subjective doing well, ready to start working shoulder a little more    Pertinent History MD plan/assessment: status post massive rotator cuff tear repair involving predominately supraspinatus but some portion of the infraspinatus.  The tear was in a V shape.  I was able to repair at least half of it from the superior towards the inferior direction but still had a small gap near the humeral head attachment.  I applied a derma span patch.  I also performed a biceps tenodesis.  He is doing exceptionally well and not having any pain.  Wearing a sling.  Long discussion regarding his diagnosis and treatment and what he may expect over time.  He did have adhesive capsulitis noted at the time of surgery which I released by manipulation and he might develop that again postoperatively as were  going to limit his motion to just passive for at least 6 weeks.    Limitations Writing;House hold activities;Lifting    Patient Stated Goals get my shoulder back to normal    Currently in Pain? No/denies                             [facility] Adult PT Treatment/Exercise - 10/02/19 0001      Shoulder Exercises: Supine   External Rotation AAROM;Left;20 reps   1# bar   Flexion AAROM;Both;20 reps   1# bar     Shoulder Exercises: Standing   ABduction AAROM;Left;20 reps   1# bar   Extension AAROM;20 reps   1# bar   Row Both;20 reps;Theraband    Theraband Level (Shoulder Row) Level 1 (Yellow)      Shoulder Exercises: Pulleys   Flexion 2 minutes    Scaption 2 minutes      Shoulder Exercises: Isometric Strengthening   Flexion Limitations 10x5"    External Rotation Limitations 10x5"    Internal Rotation Limitations 10x5"      Manual Therapy   Manual therapy comments Lt shoulder PROM gentle all planes to tolerance                  PT Education - 10/02/19 1101  Education Details updated HEP - removed pendulums, added AA exercises    Person(s) Educated Patient    Methods Explanation;Demonstration;Handout    Comprehension Verbalized understanding;Returned demonstration;Need further instruction            PT Short Term Goals - 09/26/19 1054      PT SHORT TERM GOAL #1   Title Pt will be I and compliant with initial HEP.    Time 4    Status On-going    Target Date 10/16/19      PT SHORT TERM GOAL #2   Title Pt will improve Lt shoulder PROM flexion >120, abd >100, ER >50    Time 4    Period Weeks    Status On-going             PT Long Term Goals - 09/26/19 1054      PT LONG TERM GOAL #1   Title Pt will improve Lt shoulder AROM to [facility].    Time 12    Period Weeks    Status On-going      PT LONG TERM GOAL #2   Title Pt will improve Lt shoulder strength to 4+ overall.    Time 12    Period Weeks    Status On-going      PT LONG TERM GOAL #3     Title Pt will be able to perform usual ADL's and yardwork without complaints in his Lt shoulder.    Status On-going                 Plan - 10/02/19 1101    Clinical Impression Statement Pt now 6 wks post op and able to progress to some AA exercises.  Initiated cane exercises and isometrics today with good tolerance.  He sees the MD next week, and plan to get updated measurements prior to visit.    Personal Factors and Comorbidities Other   massive tear used patch to repair   Examination-Activity Limitations Carry;Lift;Reach Overhead    Examination-Participation Restrictions Cleaning;Driving;Laundry;Yard Work;Meal Prep    Stability/Clinical Decision Making Stable/Uncomplicated    Rehab Potential Good    PT Frequency 3x / week   2-3   PT Treatment/Interventions ADLs/Self Care Home Management;Cryotherapy;Electrical Stimulation;Moist Heat;Iontophoresis 4mg /ml Dexamethasone;Ultrasound;Neuromuscular re-education;Balance training;Therapeutic exercise;Therapeutic activities;Patient/family education;Manual techniques;Dry needling;Passive range of motion;Taping;Vasopneumatic Device    PT Next Visit Plan sees MD 9/28 - measure and send MD note, review AA exercises and progress as tolerated    PT Home Exercise Plan Access Code: [email or web address]    Consulted and Agree with Plan of Care Patient           Patient will benefit from skilled therapeutic intervention in order to improve the following deficits and impairments:  Decreased activity tolerance, Decreased mobility, Decreased range of motion, Decreased strength, Hypomobility, Increased edema, Impaired flexibility, Increased muscle spasms, Increased fascial restricitons, Impaired UE functional use, Pain  Visit Diagnosis: Muscle weakness (generalized)  Chronic left shoulder pain  Stiffness of left shoulder, not elsewhere classified  Acute pain of left shoulder  Localized edema     Problem List Patient Active Problem List   Diagnosis  Date Noted  . Complete tear of left rotator cuff 08/26/2019  . AC (acromioclavicular) arthritis 08/26/2019  . Impingement syndrome of left shoulder 08/26/2019  . Biceps tendonitis on left 08/26/2019  . Right thigh pain 02/13/2019  . Pain in left shoulder 10/28/2018  . Primary osteoarthritis of both knees 01/15/2017      [name], PT, DPT  10/02/19 11:04 AM    [facility] [address] [place], [place], [address] Phone: [phone]   Fax:  [phone]  Name: Eddie Young MRN: [id number] Date of Birth: 1945-06-03

## 2019-10-03 ENCOUNTER — Encounter: Payer: Medicare Other | Admitting: Physical Therapy

## 2019-10-06 ENCOUNTER — Ambulatory Visit (INDEPENDENT_AMBULATORY_CARE_PROVIDER_SITE_OTHER): Payer: Medicare Other | Admitting: Physical Therapy

## 2019-10-06 ENCOUNTER — Other Ambulatory Visit: Payer: Self-pay

## 2019-10-06 DIAGNOSIS — M6281 Muscle weakness (generalized): Secondary | ICD-10-CM | POA: Diagnosis not present

## 2019-10-06 DIAGNOSIS — M25612 Stiffness of left shoulder, not elsewhere classified: Secondary | ICD-10-CM | POA: Diagnosis not present

## 2019-10-06 DIAGNOSIS — M25512 Pain in left shoulder: Secondary | ICD-10-CM

## 2019-10-06 DIAGNOSIS — R600 Localized edema: Secondary | ICD-10-CM | POA: Diagnosis not present

## 2019-10-06 DIAGNOSIS — G8929 Other chronic pain: Secondary | ICD-10-CM

## 2019-10-06 NOTE — Therapy (Signed)
[facility] Therapy [address] [place], [place], [address] Phone: [phone]   Fax:  [phone]  Physical Therapy Treatment/Progress note Progress Note reporting period 09/18/19 to 10/06/19  See below for objective and subjective measurements relating to patients progress with PT.   Patient Details  Name: Eddie Young MRN: [id number] Date of Birth: [birth year] Referring Provider (PT): Durward Fortes   Encounter Date: 10/06/2019   PT End of Session - 10/06/19 0925    Visit Number 6    Number of Visits 24    Date for PT Re-Evaluation 12/11/19    Progress Note Due on Visit 16   did visit 6   PT Start Time 0845    PT Stop Time 0925    PT Time Calculation (min) 40 min    Activity Tolerance Patient tolerated treatment well;No increased pain    Behavior During Therapy WFL for tasks assessed/performed           Past Medical History:  Diagnosis Date  . GERD (gastroesophageal reflux disease)   . Hyperlipidemia   . Macular degeneration   . Thyroid disease    hyper active    Past Surgical History:  Procedure Laterality Date  . ANKLE ARTHROSCOPY WITH REPAIR SUBLUXING TENDON    . KNEE CARTILAGE SURGERY    . SHOULDER ARTHROSCOPY W/ ROTATOR CUFF REPAIR      There were no vitals filed for this visit.   Subjective Assessment - 10/06/19 0902    Subjective relays compliance with HEP, denies pain upon arrival at rest    Pertinent History MD plan/assessment: status post massive rotator cuff tear repair involving predominately supraspinatus but some portion of the infraspinatus.  The tear was in a V shape.  I was able to repair at least half of it from the superior towards the inferior direction but still had a small gap near the humeral head attachment.  I applied a derma span patch.  I also performed a biceps tenodesis.  He is doing exceptionally well and not having any pain.  Wearing a sling.  Long discussion regarding his diagnosis and treatment and what  he may expect over time.  He did have adhesive capsulitis noted at the time of surgery which I released by manipulation and he might develop that again postoperatively as were going to limit his motion to just passive for at least 6 weeks.    Limitations Writing;House hold activities;Lifting    Patient Stated Goals get my shoulder back to normal              [facility] PT Assessment - 10/06/19 0001      Assessment   Medical Diagnosis S/P Lt shoulder massive RTC repair with graft, biceps tenodesis    Referring Provider (PT) [place]    Onset Date/Surgical Date 08/21/19    Hand Dominance Right      PROM   Left Shoulder Flexion 135 Degrees    Left Shoulder ABduction 110 Degrees    Left Shoulder Internal Rotation 66 Degrees    Left Shoulder External Rotation 50 Degrees                         [facility] Adult PT Treatment/Exercise - 10/06/19 0001      Shoulder Exercises: Standing   Extension Both;20 reps    Theraband Level (Shoulder Extension) Level 1 (Yellow)    Row Both;20 reps;Theraband    Theraband Level (Shoulder Row) Level 1 (Yellow)  Shoulder Exercises: Pulleys   Flexion 3 minutes    Scaption 3 minutes      Shoulder Exercises: ROM/Strengthening   Wall Wash wall ladder X 10 reps holding 5 sec for flexion and scaption      Shoulder Exercises: Isometric Strengthening   External Rotation Limitations 10x5"    Internal Rotation Limitations 10x5"    ABduction Limitations 10X5"      Shoulder Exercises: Stretch   Other Shoulder Stretches seated tableslides PROM with arms on pball roll ball 5 sec X 10 reps for scaption. Then seated ER table stretch 10 reps X 5 sec, then standing rolling pball up wall into flexion 10 reps X 5 sec      Manual Therapy   Manual therapy comments Lt shoulder PROM gentle all planes to tolerance                    PT Short Term Goals - 09/26/19 1054      PT SHORT TERM GOAL #1   Title Pt will be I and compliant with initial  HEP.    Time 4    Status On-going    Target Date 10/16/19      PT SHORT TERM GOAL #2   Title Pt will improve Lt shoulder PROM flexion >120, abd >100, ER >50    Time 4    Period Weeks    Status On-going             PT Long Term Goals - 09/26/19 1054      PT LONG TERM GOAL #1   Title Pt will improve Lt shoulder AROM to [facility].    Time 12    Period Weeks    Status On-going      PT LONG TERM GOAL #2   Title Pt will improve Lt shoulder strength to 4+ overall.    Time 12    Period Weeks    Status On-going      PT LONG TERM GOAL #3   Title Pt will be able to perform usual ADL's and yardwork without complaints in his Lt shoulder.    Status On-going                 Plan - 10/06/19 9470    Clinical Impression Statement Session continued to focus on PROM and AAROM and he has great tolerance to this. He made significant improvements in PROM measurements taken today. He will continue to benefit from PT to maximize ROM and as MD allows will begin strengthening    Personal Factors and Comorbidities Other   massive tear used patch to repair   Examination-Activity Limitations Carry;Lift;Reach Overhead    Examination-Participation Restrictions Cleaning;Driving;Laundry;Yard Work;Meal Prep    Stability/Clinical Decision Making Stable/Uncomplicated    Rehab Potential Good    PT Frequency 3x / week   2-3   PT Treatment/Interventions ADLs/Self Care Home Management;Cryotherapy;Electrical Stimulation;Moist Heat;Iontophoresis 4mg /ml Dexamethasone;Ultrasound;Neuromuscular re-education;Balance training;Therapeutic exercise;Therapeutic activities;Patient/family education;Manual techniques;Dry needling;Passive range of motion;Taping;Vasopneumatic Device    PT Home Exercise Plan Access Code: [email or web address]    Consulted and Agree with Plan of Care Patient           Patient will benefit from skilled therapeutic intervention in order to improve the following deficits and impairments:  Decreased  activity tolerance, Decreased mobility, Decreased range of motion, Decreased strength, Hypomobility, Increased edema, Impaired flexibility, Increased muscle spasms, Increased fascial restricitons, Impaired UE functional use, Pain  Visit Diagnosis: Muscle weakness (generalized)  Chronic left shoulder pain  Stiffness of left shoulder, not elsewhere classified  Acute pain of left shoulder  Localized edema     Problem List Patient Active Problem List   Diagnosis Date Noted  . Complete tear of left rotator cuff 08/26/2019  . AC (acromioclavicular) arthritis 08/26/2019  . Impingement syndrome of left shoulder 08/26/2019  . Biceps tendonitis on left 08/26/2019  . Right thigh pain 02/13/2019  . Pain in left shoulder 10/28/2018  . Primary osteoarthritis of both knees 01/15/2017    Debbe Odea, PT,DPT 10/06/2019, 9:28 AM  [facility] [address] [place], [place], [address] Phone: [phone]   Fax:  [phone]  Name: Eddie Young MRN: [id number] Date of Birth: July 19, 1945

## 2019-10-07 ENCOUNTER — Ambulatory Visit (INDEPENDENT_AMBULATORY_CARE_PROVIDER_SITE_OTHER): Payer: Medicare Other | Admitting: Orthopaedic Surgery

## 2019-10-07 ENCOUNTER — Encounter: Payer: Self-pay | Admitting: Orthopaedic Surgery

## 2019-10-07 VITALS — Ht 71.0 in | Wt 195.0 lb

## 2019-10-07 DIAGNOSIS — M7522 Bicipital tendinitis, left shoulder: Secondary | ICD-10-CM

## 2019-10-07 DIAGNOSIS — M19012 Primary osteoarthritis, left shoulder: Secondary | ICD-10-CM

## 2019-10-07 DIAGNOSIS — M7542 Impingement syndrome of left shoulder: Secondary | ICD-10-CM

## 2019-10-07 DIAGNOSIS — M75122 Complete rotator cuff tear or rupture of left shoulder, not specified as traumatic: Secondary | ICD-10-CM

## 2019-10-07 NOTE — Progress Notes (Signed)
Office Visit Note   Patient: Eddie Young           Date of Birth: 02-Jan-1946           MRN: [id number] Visit Date: 10/07/2019              Requested by: Stephens Shire, MD [address] [place],   [address] PCP: Stephens Shire, MD   Assessment & Plan: Visit Diagnoses:  1. Impingement syndrome of left shoulder   2. Arthritis of left acromioclavicular joint   3. Complete tear of left rotator cuff, unspecified whether traumatic   4. Biceps tendonitis on left     Plan:  #1: At this time we will continue physical therapy for the next 4 weeks. #2: Follow back up in 4 weeks to reassess motion and strength.  Follow-Up Instructions: Return in about 4 weeks (around 11/04/2019).   Orders:  No orders of the defined types were placed in this encounter.  No orders of the defined types were placed in this encounter.     Procedures: No procedures performed   Clinical Data: No additional findings.   Subjective: Chief Complaint  Patient presents with   Left Shoulder - Follow-up    Left shoulder scope 08/21/2019   HPI patient presents today for follow up on his left shoulder. He had a left shoulder scope on 08/21/2019. He is now a little over six weeks from surgery. Patient states that he is doing well. No complaints. Not taking anything for pain. He is still going to outpatient physical therapy.    Review of Systems   Objective: Vital Signs: Ht 5\' 11"  (1.803 m)    Wt 195 lb (88.5 kg)    BMI 27.20 kg/m   Physical Exam  Ortho Exam  Exam today reveals well-healed surgical incisions.  Knee flexes to about 130 degrees to 135 degrees.  Abduction to about 110 degrees.  Internal rotation and external rotation about 50 to 55 degrees.  Neuro vas intact distally.  Specialty Comments:  No specialty comments available.  Imaging: No results found.   PMFS History: Current Outpatient Medications  Medication Sig Dispense Refill   aspirin 325 MG  tablet Take 325 mg by mouth daily.     calcium carbonate (OS-CAL) 600 MG TABS tablet Take 600 mg by mouth daily with breakfast.     esomeprazole (NEXIUM) 40 MG capsule Take 40 mg by mouth daily at 12 noon.     GLUCOSAMINE HCL PO Take 2,000 mg by mouth daily.     methimazole (TAPAZOLE) 5 MG tablet Take 5 mg by mouth daily.     Multiple Vitamins-Minerals (ICAPS) TABS Take by mouth.     Omega-3 Fatty Acids (FISH OIL) 1000 MG CAPS Take 1 capsule by mouth daily.     rosuvastatin (CRESTOR) 20 MG tablet Take 10 mg by mouth daily.     Saw Palmetto 450 MG CAPS Take 1 capsule by mouth daily.     vitamin E (VITAMIN E) 400 UNIT capsule Take 400 Units by mouth daily.     No current facility-administered medications for this visit.   Brace MRI scan Patient Active Problem List   Diagnosis Date Noted   Complete tear of left rotator cuff 08/26/2019   AC (acromioclavicular) arthritis 08/26/2019   Impingement syndrome of left shoulder 08/26/2019   Biceps tendonitis on left 08/26/2019   Right thigh pain 02/13/2019   Pain in left shoulder 10/28/2018   Primary  osteoarthritis of both knees 01/15/2017   Past Medical History:  Diagnosis Date   GERD (gastroesophageal reflux disease)    Hyperlipidemia    Macular degeneration    Thyroid disease    hyper active    Family History  Problem Relation Age of Onset   Thyroid cancer Maternal Grandmother    Colon cancer Neg Hx     Past Surgical History:  Procedure Laterality Date   ANKLE ARTHROSCOPY WITH REPAIR SUBLUXING TENDON     KNEE CARTILAGE SURGERY     SHOULDER ARTHROSCOPY W/ ROTATOR CUFF REPAIR     Social History   Occupational History   Not on file  Tobacco Use   Smoking status: Never Smoker   Smokeless tobacco: Never Used  Vaping Use   Vaping Use: Never used  Substance and Sexual Activity   Alcohol use: Yes    Alcohol/week: 3.0 standard drinks    Types: 3 Cans of beer per week   Drug use: No   Sexual  activity: Not on file

## 2019-10-08 ENCOUNTER — Other Ambulatory Visit: Payer: Self-pay

## 2019-10-08 ENCOUNTER — Ambulatory Visit (INDEPENDENT_AMBULATORY_CARE_PROVIDER_SITE_OTHER): Payer: Medicare Other | Admitting: Physical Therapy

## 2019-10-08 DIAGNOSIS — M6281 Muscle weakness (generalized): Secondary | ICD-10-CM | POA: Diagnosis not present

## 2019-10-08 DIAGNOSIS — G8929 Other chronic pain: Secondary | ICD-10-CM

## 2019-10-08 DIAGNOSIS — R600 Localized edema: Secondary | ICD-10-CM

## 2019-10-08 DIAGNOSIS — M25512 Pain in left shoulder: Secondary | ICD-10-CM

## 2019-10-08 NOTE — Therapy (Signed)
[facility] Physical Therapy [address] [place], [place], [address] Phone: [phone]   Fax:  [phone]  Physical Therapy Treatment  Patient Details  Name: Eddie Young MRN: [id number] Date of Birth: 1945-10-25 Referring Provider (PT): Durward Fortes   Encounter Date: 10/08/2019   PT End of Session - 10/08/19 0937    Visit Number 7    Number of Visits 24    Date for PT Re-Evaluation 12/11/19    Progress Note Due on Visit 16   did visit 6   PT Start Time 0850    PT Stop Time 0930    PT Time Calculation (min) 40 min    Activity Tolerance Patient tolerated treatment well;No increased pain    Behavior During Therapy WFL for tasks assessed/performed           Past Medical History:  Diagnosis Date   GERD (gastroesophageal reflux disease)    Hyperlipidemia    Macular degeneration    Thyroid disease    hyper active    Past Surgical History:  Procedure Laterality Date   ANKLE ARTHROSCOPY WITH REPAIR SUBLUXING TENDON     KNEE CARTILAGE SURGERY     SHOULDER ARTHROSCOPY W/ ROTATOR CUFF REPAIR      There were no vitals filed for this visit.   Subjective Assessment - 10/08/19 0919    Subjective relays compliance with HEP, denies pain upon arrival at rest    Pertinent History status post massive rotator cuff tear repair involving predominately supraspinatus but some portion of the infraspinatus, with biceps tenodesis    Limitations Writing;House hold activities;Lifting    Patient Stated Goals get my shoulder back to normal    Currently in Pain? No/denies                             [facility] Adult PT Treatment/Exercise - 10/08/19 0920      Shoulder Exercises: Standing   Extension Both;20 reps    Theraband Level (Shoulder Extension) Level 1 (Yellow)    Row Both;20 reps;Theraband    Theraband Level (Shoulder Row) Level 1 (Yellow)      Shoulder Exercises: Pulleys   Flexion 3 minutes    Scaption 3 minutes      Shoulder  Exercises: ROM/Strengthening   UBE (Upper Arm Bike) 2.5 min fwd, 2.5 min retro L2    Wall Wash wall ladder X 10 reps holding 5 sec for flexion and scaption      Shoulder Exercises: Isometric Strengthening   External Rotation Limitations 10x5"    Internal Rotation Limitations 10x5"    ABduction Limitations 10X5"      Shoulder Exercises: Stretch   Other Shoulder Stretches seated tableslides PROM with arms on pball roll ball 5 sec X 10 reps for scaption. Then seated ER table stretch 10 reps X 5 sec, then standing rolling pball up wall into flexion 10 reps X 5 sec      Manual Therapy   Manual therapy comments Lt shoulder PROM gentle all planes to tolerance                    PT Short Term Goals - 09/26/19 1054      PT SHORT TERM GOAL #1   Title Pt will be I and compliant with initial HEP.    Time 4    Status On-going    Target Date 10/16/19      PT SHORT TERM GOAL #2  Title Pt will improve Lt shoulder PROM flexion >120, abd >100, ER >50    Time 4    Period Weeks    Status On-going             PT Long Term Goals - 09/26/19 1054      PT LONG TERM GOAL #1   Title Pt will improve Lt shoulder AROM to [facility].    Time 12    Period Weeks    Status On-going      PT LONG TERM GOAL #2   Title Pt will improve Lt shoulder strength to 4+ overall.    Time 12    Period Weeks    Status On-going      PT LONG TERM GOAL #3   Title Pt will be able to perform usual ADL's and yardwork without complaints in his Lt shoulder.    Status On-going                 Plan - 10/08/19 2671    Clinical Impression Statement He continues to progress with ROM but still with significant deficits in this area. He has good tolerance to stretching program and shows good effort with PT.    Personal Factors and Comorbidities Other   massive tear used patch to repair   Examination-Activity Limitations Carry;Lift;Reach Overhead    Examination-Participation Restrictions  Cleaning;Driving;Laundry;Yard Work;Meal Prep    Stability/Clinical Decision Making Stable/Uncomplicated    Rehab Potential Good    PT Frequency 3x / week   2-3   PT Treatment/Interventions ADLs/Self Care Home Management;Cryotherapy;Electrical Stimulation;Moist Heat;Iontophoresis 4mg /ml Dexamethasone;Ultrasound;Neuromuscular re-education;Balance training;Therapeutic exercise;Therapeutic activities;Patient/family education;Manual techniques;Dry needling;Passive range of motion;Taping;Vasopneumatic Device    PT Home Exercise Plan Access Code: [email or web address]    Consulted and Agree with Plan of Care Patient           Patient will benefit from skilled therapeutic intervention in order to improve the following deficits and impairments:  Decreased activity tolerance, Decreased mobility, Decreased range of motion, Decreased strength, Hypomobility, Increased edema, Impaired flexibility, Increased muscle spasms, Increased fascial restricitons, Impaired UE functional use, Pain  Visit Diagnosis: Muscle weakness (generalized)  Chronic left shoulder pain  Acute pain of left shoulder  Localized edema     Problem List Patient Active Problem List   Diagnosis Date Noted   Complete tear of left rotator cuff 08/26/2019   AC (acromioclavicular) arthritis 08/26/2019   Impingement syndrome of left shoulder 08/26/2019   Biceps tendonitis on left 08/26/2019   Right thigh pain 02/13/2019   Pain in left shoulder 10/28/2018   Primary osteoarthritis of both knees 01/15/2017    Eddie Young, PT,DPT 10/08/2019, 9:40 AM  [name] Physical Therapy [address] [place], [place], [address] Phone: [phone]   Fax:  [phone]  Name: Eddie Young MRN: [id number] Date of Birth: July 28, 1945

## 2019-10-10 ENCOUNTER — Other Ambulatory Visit: Payer: Self-pay

## 2019-10-10 ENCOUNTER — Ambulatory Visit (INDEPENDENT_AMBULATORY_CARE_PROVIDER_SITE_OTHER): Payer: Medicare Other | Admitting: Physical Therapy

## 2019-10-10 DIAGNOSIS — M6281 Muscle weakness (generalized): Secondary | ICD-10-CM | POA: Diagnosis not present

## 2019-10-10 DIAGNOSIS — M25612 Stiffness of left shoulder, not elsewhere classified: Secondary | ICD-10-CM | POA: Diagnosis not present

## 2019-10-10 DIAGNOSIS — R600 Localized edema: Secondary | ICD-10-CM | POA: Diagnosis not present

## 2019-10-10 DIAGNOSIS — M25512 Pain in left shoulder: Secondary | ICD-10-CM

## 2019-10-10 DIAGNOSIS — G8929 Other chronic pain: Secondary | ICD-10-CM

## 2019-10-10 NOTE — Therapy (Signed)
[facility] Physical Therapy [address] [place], [place], [address] Phone: [phone]   Fax:  [phone]  Physical Therapy Treatment  Patient Details  Name: Eddie Young MRN: [id number] Date of Birth: 1945/09/18 Referring Provider (PT): Durward Fortes   Encounter Date: 10/10/2019   PT End of Session - 10/10/19 0912    Visit Number 8    Number of Visits 24    Date for PT Re-Evaluation 12/11/19    Progress Note Due on Visit 16   did visit 6   PT Start Time 0845    PT Stop Time 0925    PT Time Calculation (min) 40 min    Activity Tolerance Patient tolerated treatment well;No increased pain    Behavior During Therapy WFL for tasks assessed/performed           Past Medical History:  Diagnosis Date  . GERD (gastroesophageal reflux disease)   . Hyperlipidemia   . Macular degeneration   . Thyroid disease    hyper active    Past Surgical History:  Procedure Laterality Date  . ANKLE ARTHROSCOPY WITH REPAIR SUBLUXING TENDON    . KNEE CARTILAGE SURGERY    . SHOULDER ARTHROSCOPY W/ ROTATOR CUFF REPAIR      There were no vitals filed for this visit.   Subjective Assessment - 10/10/19 0856    Subjective relays his shoulder is feeling good and he is pleased with his progress so far.    Pertinent History status post massive rotator cuff tear repair involving predominately supraspinatus but some portion of the infraspinatus, with biceps tenodesis    Limitations Writing;House hold activities;Lifting    Patient Stated Goals get my shoulder back to normal            [facility] Adult PT Treatment/Exercise - 10/10/19 0001      Shoulder Exercises: Sidelying   ABduction Left    ABduction Limitations 2 sets of 10      Shoulder Exercises: Standing   External Rotation Left    Theraband Level (Shoulder External Rotation) Level 2 (Red)    External Rotation Limitations 3x10    Internal Rotation Left    Theraband Level (Shoulder Internal Rotation) Level 2 (Red)     Internal Rotation Limitations 3x10    Extension Both    Theraband Level (Shoulder Extension) Level 2 (Red)    Extension Limitations 2x15    Row Both;Theraband    Theraband Level (Shoulder Row) Level 2 (Red)    Row Limitations 2x15      Shoulder Exercises: Pulleys   Flexion 3 minutes    Scaption 3 minutes      Shoulder Exercises: ROM/Strengthening   UBE (Upper Arm Bike) 2.5 min fwd, 2.5 min retro L2    Wall Wash wall ladder X 10 reps holding 5 sec for flexion and scaption                                Manual Therapy   Manual therapy comments Lt shoulder PROM all planes to tolerance, GH mobs A-P, and inferior                    PT Short Term Goals - 09/26/19 1054      PT SHORT TERM GOAL #1   Title Pt will be I and compliant with initial HEP.    Time 4    Status On-going    Target Date 10/16/19  PT SHORT TERM GOAL #2   Title Pt will improve Lt shoulder PROM flexion >120, abd >100, ER >50    Time 4    Period Weeks    Status On-going             PT Long Term Goals - 09/26/19 1054      PT LONG TERM GOAL #1   Title Pt will improve Lt shoulder AROM to [facility].    Time 12    Period Weeks    Status On-going      PT LONG TERM GOAL #2   Title Pt will improve Lt shoulder strength to 4+ overall.    Time 12    Period Weeks    Status On-going      PT LONG TERM GOAL #3   Title Pt will be able to perform usual ADL's and yardwork without complaints in his Lt shoulder.    Status On-going                 Plan - 10/10/19 0910    Clinical Impression Statement He is progressing very well today, gave him red Tband to take home for more strengthening into his HEP as he is doing well. Continue POC    Personal Factors and Comorbidities Other   massive tear used patch to repair   Examination-Activity Limitations Carry;Lift;Reach Overhead    Examination-Participation Restrictions Cleaning;Driving;Laundry;Yard Work;Meal Prep    Stability/Clinical Decision  Making Stable/Uncomplicated    Rehab Potential Good    PT Frequency 3x / week   2-3   PT Treatment/Interventions ADLs/Self Care Home Management;Cryotherapy;Electrical Stimulation;Moist Heat;Iontophoresis 4mg /ml Dexamethasone;Ultrasound;Neuromuscular re-education;Balance training;Therapeutic exercise;Therapeutic activities;Patient/family education;Manual techniques;Dry needling;Passive range of motion;Taping;Vasopneumatic Device    PT Home Exercise Plan Access Code: [email or web address], added tband rows, ext, IR,ER. Added sidelying abd (he relays he did not need picutres for these)    Consulted and Agree with Plan of Care Patient           Patient will benefit from skilled therapeutic intervention in order to improve the following deficits and impairments:  Decreased activity tolerance, Decreased mobility, Decreased range of motion, Decreased strength, Hypomobility, Increased edema, Impaired flexibility, Increased muscle spasms, Increased fascial restricitons, Impaired UE functional use, Pain  Visit Diagnosis: Muscle weakness (generalized)  Chronic left shoulder pain  Acute pain of left shoulder  Localized edema  Stiffness of left shoulder, not elsewhere classified     Problem List Patient Active Problem List   Diagnosis Date Noted  . Complete tear of left rotator cuff 08/26/2019  . AC (acromioclavicular) arthritis 08/26/2019  . Impingement syndrome of left shoulder 08/26/2019  . Biceps tendonitis on left 08/26/2019  . Right thigh pain 02/13/2019  . Pain in left shoulder 10/28/2018  . Primary osteoarthritis of both knees 01/15/2017    [name] 10/10/2019, 9:36 AM  [facility] Physical Therapy [address] [place], [place], [address] Phone: [phone]   Fax:  [phone]  Name: Eddie Young MRN: [id number] Date of Birth: 10/28/1945

## 2019-10-13 ENCOUNTER — Other Ambulatory Visit: Payer: Self-pay

## 2019-10-13 ENCOUNTER — Ambulatory Visit (INDEPENDENT_AMBULATORY_CARE_PROVIDER_SITE_OTHER): Payer: Medicare Other | Admitting: Physical Therapy

## 2019-10-13 DIAGNOSIS — R600 Localized edema: Secondary | ICD-10-CM

## 2019-10-13 DIAGNOSIS — M25512 Pain in left shoulder: Secondary | ICD-10-CM

## 2019-10-13 DIAGNOSIS — M6281 Muscle weakness (generalized): Secondary | ICD-10-CM | POA: Diagnosis not present

## 2019-10-13 DIAGNOSIS — G8929 Other chronic pain: Secondary | ICD-10-CM

## 2019-10-13 DIAGNOSIS — M25612 Stiffness of left shoulder, not elsewhere classified: Secondary | ICD-10-CM | POA: Diagnosis not present

## 2019-10-13 NOTE — Therapy (Signed)
[facility] Physical Therapy [address] [place], [place], [address] Phone: [phone]   Fax:  [phone]  Physical Therapy Treatment  Patient Details  Name: Eddie Young MRN: [id number] Date of Birth: 09-23-1945 Referring Provider (PT): Durward Fortes   Encounter Date: 10/13/2019   PT End of Session - 10/13/19 0929    Visit Number 9    Number of Visits 24    Date for PT Re-Evaluation 12/11/19    Progress Note Due on Visit 16   did visit 6   PT Start Time 0845    PT Stop Time 0929    PT Time Calculation (min) 44 min    Activity Tolerance Patient tolerated treatment well;No increased pain    Behavior During Therapy WFL for tasks assessed/performed           Past Medical History:  Diagnosis Date   GERD (gastroesophageal reflux disease)    Hyperlipidemia    Macular degeneration    Thyroid disease    hyper active    Past Surgical History:  Procedure Laterality Date   ANKLE ARTHROSCOPY WITH REPAIR SUBLUXING TENDON     KNEE CARTILAGE SURGERY     SHOULDER ARTHROSCOPY W/ ROTATOR CUFF REPAIR      There were no vitals filed for this visit.   Subjective Assessment - 10/13/19 0908    Subjective relays his shoulder is doing well, no pain upon arrival    Pertinent History status post massive rotator cuff tear repair involving predominately supraspinatus but some portion of the infraspinatus, with biceps tenodesis    Limitations Writing;House hold activities;Lifting    Patient Stated Goals get my shoulder back to normal             [facility] Adult PT Treatment/Exercise - 10/13/19 0001      Shoulder Exercises: Sidelying   External Rotation Left    External Rotation Weight (lbs) 1    External Rotation Limitations 2X10    ABduction Left    ABduction Weight (lbs) 1    ABduction Limitations 2 sets of 10      Shoulder Exercises: Standing   External Rotation Left    Theraband Level (Shoulder External Rotation) Level 2 (Red)    External Rotation  Limitations 2X15    Internal Rotation Left    Theraband Level (Shoulder Internal Rotation) Level 2 (Red)    Internal Rotation Limitations 2X15    Flexion Both    Shoulder Flexion Weight (lbs) 1    Flexion Limitations 2X10    ABduction Both    Shoulder ABduction Weight (lbs) 0    ABduction Limitations 3X10    Extension Both    Theraband Level (Shoulder Extension) Level 3 (Green)    Extension Limitations 2x15    Row Both;Theraband    Theraband Level (Shoulder Row) Level 3 (Green)    Row Limitations 2x15      Shoulder Exercises: Pulleys   Flexion 3 minutes    Scaption 3 minutes      Shoulder Exercises: ROM/Strengthening   UBE (Upper Arm Bike) 2.5 min fwd, 2.5 min retro L2    Wall Wash wall ladder X 10 reps holding 5 sec for flexion and scaption      Manual Therapy   Manual therapy comments Lt shoulder PROM all planes to tolerance, GH mobs A-P, and inferior                    PT Short Term Goals - 09/26/19 1054  PT SHORT TERM GOAL #1   Title Pt will be I and compliant with initial HEP.    Time 4    Status On-going    Target Date 10/16/19      PT SHORT TERM GOAL #2   Title Pt will improve Lt shoulder PROM flexion >120, abd >100, ER >50    Time 4    Period Weeks    Status On-going             PT Long Term Goals - 09/26/19 1054      PT LONG TERM GOAL #1   Title Pt will improve Lt shoulder AROM to [facility].    Time 12    Period Weeks    Status On-going      PT LONG TERM GOAL #2   Title Pt will improve Lt shoulder strength to 4+ overall.    Time 12    Period Weeks    Status On-going      PT LONG TERM GOAL #3   Title Pt will be able to perform usual ADL's and yardwork without complaints in his Lt shoulder.    Status On-going                 Plan - 10/13/19 0929    Clinical Impression Statement increased strengthening today with good tolerance. He will need 10th visit progress note next visit.    Personal Factors and Comorbidities Other    massive tear used patch to repair   Examination-Activity Limitations Carry;Lift;Reach Overhead    Examination-Participation Restrictions Cleaning;Driving;Laundry;Yard Work;Meal Prep    Stability/Clinical Decision Making Stable/Uncomplicated    Rehab Potential Good    PT Frequency 3x / week   2-3   PT Treatment/Interventions ADLs/Self Care Home Management;Cryotherapy;Electrical Stimulation;Moist Heat;Iontophoresis 4mg /ml Dexamethasone;Ultrasound;Neuromuscular re-education;Balance training;Therapeutic exercise;Therapeutic activities;Patient/family education;Manual techniques;Dry needling;Passive range of motion;Taping;Vasopneumatic Device    PT Next Visit Plan progress note    PT Home Exercise Plan Access Code: [email or web address], added tband rows, ext, IR,ER. Added sidelying abd (he relays he did not need picutres for these)    Consulted and Agree with Plan of Care Patient           Patient will benefit from skilled therapeutic intervention in order to improve the following deficits and impairments:  Decreased activity tolerance, Decreased mobility, Decreased range of motion, Decreased strength, Hypomobility, Increased edema, Impaired flexibility, Increased muscle spasms, Increased fascial restricitons, Impaired UE functional use, Pain  Visit Diagnosis: Muscle weakness (generalized)  Chronic left shoulder pain  Acute pain of left shoulder  Localized edema  Stiffness of left shoulder, not elsewhere classified     Problem List Patient Active Problem List   Diagnosis Date Noted   Complete tear of left rotator cuff 08/26/2019   AC (acromioclavicular) arthritis 08/26/2019   Impingement syndrome of left shoulder 08/26/2019   Biceps tendonitis on left 08/26/2019   Right thigh pain 02/13/2019   Pain in left shoulder 10/28/2018   Primary osteoarthritis of both knees 01/15/2017    Eddie Young 10/13/2019, 9:30 AM  [facility] Physical Therapy [address] [place], [place], [address] Phone: [phone]   Fax:  [phone]  Name: Eddie Young MRN: [id number] Date of Birth: Feb 13, 1945

## 2019-10-15 ENCOUNTER — Ambulatory Visit (INDEPENDENT_AMBULATORY_CARE_PROVIDER_SITE_OTHER): Payer: Medicare Other | Admitting: Physical Therapy

## 2019-10-15 ENCOUNTER — Other Ambulatory Visit: Payer: Self-pay

## 2019-10-15 ENCOUNTER — Encounter: Payer: Self-pay | Admitting: Physical Therapy

## 2019-10-15 DIAGNOSIS — M25612 Stiffness of left shoulder, not elsewhere classified: Secondary | ICD-10-CM | POA: Diagnosis not present

## 2019-10-15 DIAGNOSIS — R600 Localized edema: Secondary | ICD-10-CM

## 2019-10-15 DIAGNOSIS — M6281 Muscle weakness (generalized): Secondary | ICD-10-CM | POA: Diagnosis not present

## 2019-10-15 DIAGNOSIS — G8929 Other chronic pain: Secondary | ICD-10-CM

## 2019-10-15 DIAGNOSIS — M25512 Pain in left shoulder: Secondary | ICD-10-CM | POA: Diagnosis not present

## 2019-10-15 NOTE — Therapy (Signed)
[facility] [address] [place], [place], [address] Phone: [phone]   Fax:  [phone]  Physical Therapy Treatment  Patient Details  Name: Eddie Young MRN: [id number] Date of Birth: [birth year] Referring Provider (PT): Durward Fortes   Encounter Date: 10/15/2019   PT End of Session - 10/15/19 0903    Visit Number 10    Number of Visits 24    Date for PT Re-Evaluation 12/11/19    Progress Note Due on Visit 16   did visit 6   PT Start Time 0852    PT Stop Time 0930    PT Time Calculation (min) 38 min    Activity Tolerance Patient tolerated treatment well;No increased pain    Behavior During Therapy WFL for tasks assessed/performed           Past Medical History:  Diagnosis Date  . GERD (gastroesophageal reflux disease)   . Hyperlipidemia   . Macular degeneration   . Thyroid disease    hyper active    Past Surgical History:  Procedure Laterality Date  . ANKLE ARTHROSCOPY WITH REPAIR SUBLUXING TENDON    . KNEE CARTILAGE SURGERY    . SHOULDER ARTHROSCOPY W/ ROTATOR CUFF REPAIR      There were no vitals filed for this visit.   Subjective Assessment - 10/15/19 0901    Subjective relays he is pleased with his progress thus far.    Pertinent History status post massive rotator cuff tear repair involving predominately supraspinatus but some portion of the infraspinatus, with biceps tenodesis    Limitations Writing;House hold activities;Lifting    Patient Stated Goals get my shoulder back to normal    Currently in Pain? No/denies              [facility] PT Assessment - 10/15/19 0001      Assessment   Medical Diagnosis S/P Lt shoulder massive RTC repair with graft, biceps tenodesis    Referring Provider (PT) [place]    Onset Date/Surgical Date 08/21/19    Hand Dominance Right      ROM / Strength   AROM / PROM / Strength AROM;PROM;Strength      AROM   AROM Assessment Site Shoulder    Right/Left Shoulder Left    Left  Shoulder Flexion 127 Degrees    Left Shoulder ABduction 90 Degrees    Left Shoulder Internal Rotation --   [facility]   Left Shoulder External Rotation --   C4 reach behind head     PROM   Left Shoulder Flexion 160 Degrees    Left Shoulder ABduction 140 Degrees    Left Shoulder External Rotation 55 Degrees      Strength   Strength Assessment Site Shoulder    Right/Left Shoulder Left    Left Shoulder Flexion 3-/5    Left Shoulder ABduction 3-/5    Left Shoulder Internal Rotation 4+/5    Left Shoulder External Rotation 4/5            [facility] Adult PT Treatment/Exercise - 10/15/19 0001                     ABduction Left    ABduction Weight (lbs) 2    ABduction Limitations 2 sets of 10      Shoulder Exercises: Standing   External Rotation Left    Theraband Level (Shoulder External Rotation) Level 3 (Green)    External Rotation Limitations 2X15    Internal Rotation Left    Theraband Level (  Shoulder Internal Rotation) Level 3 (Green)    Internal Rotation Limitations 2X15    Flexion Both    Shoulder Flexion Weight (lbs) 1    Flexion Limitations 15 reps, 10 reps    ABduction Both    Shoulder ABduction Weight (lbs) --    ABduction Limitations 1 lb for 5 reps, then no weight for 2 sets of 10    Extension Both    Theraband Level (Shoulder Extension) Level 4 (Blue)    Extension Limitations 2x15    Row Both;Theraband    Theraband Level (Shoulder Row) Level 4 (Blue)    Row Limitations Doorway stretch for ER 2x15 5 sec X 15       Shoulder Exercises: Pulleys   Flexion 3 minutes    Scaption 3 minutes      Shoulder Exercises: ROM/Strengthening   UBE (Upper Arm Bike) --    Wall Wash wall ladder X 10 reps holding 5 sec for flexion and scaption      Manual Therapy   Manual therapy comments Lt shoulder PROM all planes to tolerance, GH mobs A-P, and inferior                    PT Short Term Goals - 10/15/19 0921      PT SHORT TERM GOAL #1   Title Pt will be I and  compliant with initial HEP.    Time 4    Status Achieved    Target Date 10/16/19      PT SHORT TERM GOAL #2   Title Pt will improve Lt shoulder PROM flexion >120, abd >100, ER >50    Time 4    Period Weeks    Status Achieved      PT SHORT TERM GOAL #3   Title Pt will improve Lt shoulder AROM flexion to 130 and abd to 100    Time 4    Period Weeks    Status New    Target Date 11/12/19             PT Long Term Goals - 09/26/19 1054      PT LONG TERM GOAL #1   Title Pt will improve Lt shoulder AROM to [facility].    Time 12    Period Weeks    Status On-going      PT LONG TERM GOAL #2   Title Pt will improve Lt shoulder strength to 4+ overall.    Time 12    Period Weeks    Status On-going      PT LONG TERM GOAL #3   Title Pt will be able to perform usual ADL's and yardwork without complaints in his Lt shoulder.    Status On-going                 Plan - 10/15/19 0919    Clinical Impression Statement updated measurements show good progress overall with ROM and strength. Progress note was done visit 6 so will not need another until viist 16. PT will continue to progress as able.    Personal Factors and Comorbidities Other   massive tear used patch to repair   Examination-Activity Limitations Carry;Lift;Reach Overhead    Examination-Participation Restrictions Cleaning;Driving;Laundry;Yard Work;Meal Prep    Stability/Clinical Decision Making Stable/Uncomplicated    Rehab Potential Good    PT Frequency 3x / week   2-3   PT Treatment/Interventions ADLs/Self Care Home Management;Cryotherapy;Electrical Stimulation;Moist Heat;Iontophoresis 4mg /ml Dexamethasone;Ultrasound;Neuromuscular re-education;Balance training;Therapeutic exercise;Therapeutic activities;Patient/family education;Manual techniques;Dry needling;Passive  range of motion;Taping;Vasopneumatic Device    PT Next Visit Plan progress note    PT Home Exercise Plan Access Code: [email or web address], added tband rows, ext, IR,ER.  Added sidelying abd, standing flexion, abd AROM (he relays he did not need picutres for these)    Consulted and Agree with Plan of Care Patient           Patient will benefit from skilled therapeutic intervention in order to improve the following deficits and impairments:  Decreased activity tolerance, Decreased mobility, Decreased range of motion, Decreased strength, Hypomobility, Increased edema, Impaired flexibility, Increased muscle spasms, Increased fascial restricitons, Impaired UE functional use, Pain  Visit Diagnosis: Muscle weakness (generalized)  Chronic left shoulder pain  Acute pain of left shoulder  Localized edema  Stiffness of left shoulder, not elsewhere classified     Problem List Patient Active Problem List   Diagnosis Date Noted  . Complete tear of left rotator cuff 08/26/2019  . AC (acromioclavicular) arthritis 08/26/2019  . Impingement syndrome of left shoulder 08/26/2019  . Biceps tendonitis on left 08/26/2019  . Right thigh pain 02/13/2019  . Pain in left shoulder 10/28/2018  . Primary osteoarthritis of both knees 01/15/2017    [name] 10/15/2019, 9:39 AM  [facility] [address] [place], [place], [address] Phone: [phone]   Fax:  [phone]  Name: Eddie Young MRN: [id number] Date of Birth: [birth year]

## 2019-10-17 ENCOUNTER — Ambulatory Visit (INDEPENDENT_AMBULATORY_CARE_PROVIDER_SITE_OTHER): Payer: Medicare Other | Admitting: Physical Therapy

## 2019-10-17 ENCOUNTER — Other Ambulatory Visit: Payer: Self-pay

## 2019-10-17 ENCOUNTER — Encounter: Payer: Self-pay | Admitting: Physical Therapy

## 2019-10-17 DIAGNOSIS — M6281 Muscle weakness (generalized): Secondary | ICD-10-CM | POA: Diagnosis not present

## 2019-10-17 DIAGNOSIS — M25612 Stiffness of left shoulder, not elsewhere classified: Secondary | ICD-10-CM | POA: Diagnosis not present

## 2019-10-17 DIAGNOSIS — R600 Localized edema: Secondary | ICD-10-CM

## 2019-10-17 DIAGNOSIS — M25512 Pain in left shoulder: Secondary | ICD-10-CM | POA: Diagnosis not present

## 2019-10-17 DIAGNOSIS — G8929 Other chronic pain: Secondary | ICD-10-CM

## 2019-10-17 NOTE — Therapy (Signed)
[facility] [address] [place], [place], [address] Phone: [phone]   Fax:  [phone]  Physical Therapy Treatment  Patient Details  Name: Eddie Young MRN: [id number] Date of Birth: August 22, 1945 Referring Provider (PT): Durward Fortes   Encounter Date: 10/17/2019   PT End of Session - 10/17/19 0939    Visit Number 11    Number of Visits 24    Date for PT Re-Evaluation 12/11/19    Progress Note Due on Visit 16   did visit 6   PT Start Time 0845    PT Stop Time 0928    PT Time Calculation (min) 43 min    Activity Tolerance Patient tolerated treatment well;No increased pain    Behavior During Therapy WFL for tasks assessed/performed           Past Medical History:  Diagnosis Date  . GERD (gastroesophageal reflux disease)   . Hyperlipidemia   . Macular degeneration   . Thyroid disease    hyper active    Past Surgical History:  Procedure Laterality Date  . ANKLE ARTHROSCOPY WITH REPAIR SUBLUXING TENDON    . KNEE CARTILAGE SURGERY    . SHOULDER ARTHROSCOPY W/ ROTATOR CUFF REPAIR      There were no vitals filed for this visit.   Subjective Assessment - 10/17/19 0843    Subjective feels like things are going well.    Pertinent History status post massive rotator cuff tear repair involving predominately supraspinatus but some portion of the infraspinatus, with biceps tenodesis    Limitations Writing;House hold activities;Lifting    Patient Stated Goals get my shoulder back to normal    Currently in Pain? No/denies                             [facility] Adult PT Treatment/Exercise - 10/17/19 0845      Shoulder Exercises: Sidelying   External Rotation Left   2x15   External Rotation Weight (lbs) 2    ABduction Left   2x15   ABduction Weight (lbs) 2      Shoulder Exercises: Standing   External Rotation Left   2x15   Internal Rotation Left   2x15   Theraband Level (Shoulder Internal Rotation) Level 3 (Green)     Flexion Left   2x15   Shoulder Flexion Weight (lbs) 1    ABduction Limitations passive concentric; active eccentric x 10 reps - mirror feedback for technique    Extension Both   2x15   Theraband Level (Shoulder Extension) Level 4 (Blue)    Row Both;Theraband   2x15   Theraband Level (Shoulder Row) Level 4 (Blue)      Shoulder Exercises: Pulleys   Flexion 3 minutes    Scaption 3 minutes      Shoulder Exercises: ROM/Strengthening   [place] scaption x 20 reps - working on decreasing shoulder shrug    Wall Wash wall ladder X 10 reps holding 5 sec for flexion and scaption      Manual Therapy   Manual therapy comments Lt shoulder PROM all planes to tolerance                    PT Short Term Goals - 10/15/19 7591      PT SHORT TERM GOAL #1   Title Pt will be I and compliant with initial HEP.    Time 4    Status Achieved    Target Date  10/16/19      PT SHORT TERM GOAL #2   Title Pt will improve Lt shoulder PROM flexion >120, abd >100, ER >50    Time 4    Period Weeks    Status Achieved      PT SHORT TERM GOAL #3   Title Pt will improve Lt shoulder AROM flexion to 130 and abd to 100    Time 4    Period Weeks    Status New    Target Date 11/12/19             PT Long Term Goals - 09/26/19 1054      PT LONG TERM GOAL #1   Title Pt will improve Lt shoulder AROM to [facility].    Time 12    Period Weeks    Status On-going      PT LONG TERM GOAL #2   Title Pt will improve Lt shoulder strength to 4+ overall.    Time 12    Period Weeks    Status On-going      PT LONG TERM GOAL #3   Title Pt will be able to perform usual ADL's and yardwork without complaints in his Lt shoulder.    Status On-going                 Plan - 10/17/19 0939    Clinical Impression Statement Pt progressing well with PT, with noted shoulder shrug with abduction needing mirror feedback to work on correcting.  Will continue to benefit from PT to maximize function.    Personal Factors  and Comorbidities Other   massive tear used patch to repair   Examination-Activity Limitations Carry;Lift;Reach Overhead    Examination-Participation Restrictions Cleaning;Driving;Laundry;Yard Work;Meal Prep    Stability/Clinical Decision Making Stable/Uncomplicated    Rehab Potential Good    PT Frequency 3x / week   2-3   PT Treatment/Interventions ADLs/Self Care Home Management;Cryotherapy;Electrical Stimulation;Moist Heat;Iontophoresis 4mg /ml Dexamethasone;Ultrasound;Neuromuscular re-education;Balance training;Therapeutic exercise;Therapeutic activities;Patient/family education;Manual techniques;Dry needling;Passive range of motion;Taping;Vasopneumatic Device    PT Next Visit Plan continue per POC, work on strengthening    PT [place]: [id number], added tband rows, ext, IR,ER. Added sidelying abd, standing flexion, abd AROM (he relays he did not need picutres for these)    Consulted and Agree with Plan of Care Patient           Patient will benefit from skilled therapeutic intervention in order to improve the following deficits and impairments:  Decreased activity tolerance, Decreased mobility, Decreased range of motion, Decreased strength, Hypomobility, Increased edema, Impaired flexibility, Increased muscle spasms, Increased fascial restricitons, Impaired UE functional use, Pain  Visit Diagnosis: Muscle weakness (generalized)  Chronic left shoulder pain  Acute pain of left shoulder  Localized edema  Stiffness of left shoulder, not elsewhere classified     Problem List Patient Active Problem List   Diagnosis Date Noted  . Complete tear of left rotator cuff 08/26/2019  . AC (acromioclavicular) arthritis 08/26/2019  . Impingement syndrome of left shoulder 08/26/2019  . Biceps tendonitis on left 08/26/2019  . Right thigh pain 02/13/2019  . Pain in left shoulder 10/28/2018  . Primary osteoarthritis of both knees 01/15/2017        [name], PT, DPT 10/17/19 9:41 AM     [facility] [address] [place], [place], [address] Phone: [phone]   Fax:  [phone]  Name: Eddie Young MRN: [id number] Date of Birth: 1945-12-02

## 2019-10-20 ENCOUNTER — Other Ambulatory Visit: Payer: Self-pay

## 2019-10-20 ENCOUNTER — Encounter: Payer: Self-pay | Admitting: Physical Therapy

## 2019-10-20 ENCOUNTER — Ambulatory Visit (INDEPENDENT_AMBULATORY_CARE_PROVIDER_SITE_OTHER): Payer: Medicare Other | Admitting: Physical Therapy

## 2019-10-20 DIAGNOSIS — M25612 Stiffness of left shoulder, not elsewhere classified: Secondary | ICD-10-CM

## 2019-10-20 DIAGNOSIS — G8929 Other chronic pain: Secondary | ICD-10-CM

## 2019-10-20 DIAGNOSIS — M6281 Muscle weakness (generalized): Secondary | ICD-10-CM

## 2019-10-20 DIAGNOSIS — M25512 Pain in left shoulder: Secondary | ICD-10-CM | POA: Diagnosis not present

## 2019-10-20 DIAGNOSIS — R600 Localized edema: Secondary | ICD-10-CM

## 2019-10-20 NOTE — Therapy (Signed)
[facility] [address] [place], [place], [address] Phone: [phone]   Fax:  [phone]  Physical Therapy Treatment  Patient Details  Name: Eddie Young MRN: [id number] Date of Birth: 1945/10/15 Referring Provider (PT): Durward Fortes   Encounter Date: 10/20/2019   PT End of Session - 10/20/19 0924    Visit Number 12    Number of Visits 24    Date for PT Re-Evaluation 12/11/19    Progress Note Due on Visit 16   did visit 6   PT Start Time 0845    PT Stop Time 0924    PT Time Calculation (min) 39 min    Activity Tolerance Patient tolerated treatment well;No increased pain    Behavior During Therapy WFL for tasks assessed/performed           Past Medical History:  Diagnosis Date  . GERD (gastroesophageal reflux disease)   . Hyperlipidemia   . Macular degeneration   . Thyroid disease    hyper active    Past Surgical History:  Procedure Laterality Date  . ANKLE ARTHROSCOPY WITH REPAIR SUBLUXING TENDON    . KNEE CARTILAGE SURGERY    . SHOULDER ARTHROSCOPY W/ ROTATOR CUFF REPAIR      There were no vitals filed for this visit.   Subjective Assessment - 10/20/19 0846    Subjective shoulder is still pretty weak, but overall feels he's improving.    Pertinent History status post massive rotator cuff tear repair involving predominately supraspinatus but some portion of the infraspinatus, with biceps tenodesis    Limitations Writing;House hold activities;Lifting    Patient Stated Goals get my shoulder back to normal    Currently in Pain? No/denies                             [facility] Adult PT Treatment/Exercise - 10/20/19 0847      Shoulder Exercises: Supine   Flexion --      Shoulder Exercises: Standing   External Rotation Left;AAROM   3x10; 3# bar   Flexion AAROM   3x10, 3# bar   ABduction AAROM;Left   3x10, 3# bar     Shoulder Exercises: Pulleys   Flexion 3 minutes    Scaption 3 minutes      Shoulder  Exercises: ROM/Strengthening   Wall Wash wall ladder X 20 reps holding 5 sec for flexion and scaption; 2# wrist weight                    PT Short Term Goals - 10/15/19 0921      PT SHORT TERM GOAL #1   Title Pt will be I and compliant with initial HEP.    Time 4    Status Achieved    Target Date 10/16/19      PT SHORT TERM GOAL #2   Title Pt will improve Lt shoulder PROM flexion >120, abd >100, ER >50    Time 4    Period Weeks    Status Achieved      PT SHORT TERM GOAL #3   Title Pt will improve Lt shoulder AROM flexion to 130 and abd to 100    Time 4    Period Weeks    Status New    Target Date 11/12/19             PT Long Term Goals - 09/26/19 1054      PT LONG TERM GOAL #  1   Title Pt will improve Lt shoulder AROM to [facility].    Time 12    Period Weeks    Status On-going      PT LONG TERM GOAL #2   Title Pt will improve Lt shoulder strength to 4+ overall.    Time 12    Period Weeks    Status On-going      PT LONG TERM GOAL #3   Title Pt will be able to perform usual ADL's and yardwork without complaints in his Lt shoulder.    Status On-going                 Plan - 10/20/19 0924    Clinical Impression Statement Pt tolerated session well today.  He was initially concerned about shoulder "freezing" but he has near full PROM, just weakness affecting his ability to move through the full range.  Will continue to focus on strengthening shoulder to maximize function.    Personal Factors and Comorbidities Other   massive tear used patch to repair   Examination-Activity Limitations Carry;Lift;Reach Overhead    Examination-Participation Restrictions Cleaning;Driving;Laundry;Yard Work;Meal Prep    Stability/Clinical Decision Making Stable/Uncomplicated    Rehab Potential Good    PT Frequency 3x / week   2-3   PT Treatment/Interventions ADLs/Self Care Home Management;Cryotherapy;Electrical Stimulation;Moist Heat;Iontophoresis 4mg /ml  Dexamethasone;Ultrasound;Neuromuscular re-education;Balance training;Therapeutic exercise;Therapeutic activities;Patient/family education;Manual techniques;Dry needling;Passive range of motion;Taping;Vasopneumatic Device    PT Next Visit Plan continue per POC, work on strengthening    PT [place]: [id number], added tband rows, ext, IR,ER. Added sidelying abd, standing flexion, abd AROM (he relays he did not need picutres for these)    Consulted and Agree with Plan of Care Patient           Patient will benefit from skilled therapeutic intervention in order to improve the following deficits and impairments:  Decreased activity tolerance, Decreased mobility, Decreased range of motion, Decreased strength, Hypomobility, Increased edema, Impaired flexibility, Increased muscle spasms, Increased fascial restricitons, Impaired UE functional use, Pain  Visit Diagnosis: Muscle weakness (generalized)  Chronic left shoulder pain  Acute pain of left shoulder  Localized edema  Stiffness of left shoulder, not elsewhere classified     Problem List Patient Active Problem List   Diagnosis Date Noted  . Complete tear of left rotator cuff 08/26/2019  . AC (acromioclavicular) arthritis 08/26/2019  . Impingement syndrome of left shoulder 08/26/2019  . Biceps tendonitis on left 08/26/2019  . Right thigh pain 02/13/2019  . Pain in left shoulder 10/28/2018  . Primary osteoarthritis of both knees 01/15/2017      [name], PT, DPT 10/20/19 9:26 AM     [facility] [address] [place], [place], [address] Phone: [phone]   Fax:  [phone]  Name: Eddie Young MRN: [id number] Date of Birth: December 19, 1945

## 2019-10-22 ENCOUNTER — Ambulatory Visit (INDEPENDENT_AMBULATORY_CARE_PROVIDER_SITE_OTHER): Payer: Medicare Other | Admitting: Physical Therapy

## 2019-10-22 ENCOUNTER — Other Ambulatory Visit: Payer: Self-pay

## 2019-10-22 ENCOUNTER — Encounter: Payer: Self-pay | Admitting: Physical Therapy

## 2019-10-22 DIAGNOSIS — G8929 Other chronic pain: Secondary | ICD-10-CM

## 2019-10-22 DIAGNOSIS — M25512 Pain in left shoulder: Secondary | ICD-10-CM

## 2019-10-22 DIAGNOSIS — M6281 Muscle weakness (generalized): Secondary | ICD-10-CM

## 2019-10-22 DIAGNOSIS — R600 Localized edema: Secondary | ICD-10-CM

## 2019-10-22 DIAGNOSIS — M25612 Stiffness of left shoulder, not elsewhere classified: Secondary | ICD-10-CM

## 2019-10-22 NOTE — Therapy (Signed)
[facility] [address] [place], [place], [address] Phone: [phone]   Fax:  [phone]  Physical Therapy Treatment  Patient Details  Name: Eddie Young MRN: [id number] Date of Birth: 1946/01/04 Referring Provider (PT): Durward Fortes   Encounter Date: 10/22/2019   PT End of Session - 10/22/19 0947    Visit Number 13    Number of Visits 24    Date for PT Re-Evaluation 12/11/19    Progress Note Due on Visit 16   did visit 6   PT Start Time 0845    PT Stop Time 0930    PT Time Calculation (min) 45 min    Activity Tolerance Patient tolerated treatment well;No increased pain    Behavior During Therapy WFL for tasks assessed/performed           Past Medical History:  Diagnosis Date  . GERD (gastroesophageal reflux disease)   . Hyperlipidemia   . Macular degeneration   . Thyroid disease    hyper active    Past Surgical History:  Procedure Laterality Date  . ANKLE ARTHROSCOPY WITH REPAIR SUBLUXING TENDON    . KNEE CARTILAGE SURGERY    . SHOULDER ARTHROSCOPY W/ ROTATOR CUFF REPAIR      There were no vitals filed for this visit.   Subjective Assessment - 10/22/19 0847    Subjective doing well; working on using shoulder muscles more    Pertinent History status post massive rotator cuff tear repair involving predominately supraspinatus but some portion of the infraspinatus, with biceps tenodesis    Limitations Writing;House hold activities;Lifting    Patient Stated Goals get my shoulder back to normal    Currently in Pain? No/denies                             [facility] Adult PT Treatment/Exercise - 10/22/19 0847      Shoulder Exercises: Standing   External Rotation Left   2x15   Theraband Level (Shoulder External Rotation) Level 3 (Green)    Internal Rotation Left   2x15   Theraband Level (Shoulder Internal Rotation) Level 3 (Green)    Flexion Both;Weights   2x10   Shoulder Flexion Weight (lbs) 1    ABduction  AROM;Both   2x10   ABduction Limitations 1# on Rt; limited range on Lt -  through range without shrug    Row Both;Theraband   2x15   Theraband Level (Shoulder Row) Level 4 (Blue)      Shoulder Exercises: Pulleys   Flexion 3 minutes    Scaption 3 minutes      Shoulder Exercises: ROM/Strengthening   Wall Wash wall ladder X 20 reps holding 5 sec for flexion and scaption; 2# wrist weight                    PT Short Term Goals - 10/15/19 0921      PT SHORT TERM GOAL #1   Title Pt will be I and compliant with initial HEP.    Time 4    Status Achieved    Target Date 10/16/19      PT SHORT TERM GOAL #2   Title Pt will improve Lt shoulder PROM flexion >120, abd >100, ER >50    Time 4    Period Weeks    Status Achieved      PT SHORT TERM GOAL #3   Title Pt will improve Lt shoulder AROM flexion to 130  and abd to 100    Time 4    Period Weeks    Status New    Target Date 11/12/19             PT Long Term Goals - 09/26/19 1054      PT LONG TERM GOAL #1   Title Pt will improve Lt shoulder AROM to [facility].    Time 12    Period Weeks    Status On-going      PT LONG TERM GOAL #2   Title Pt will improve Lt shoulder strength to 4+ overall.    Time 12    Period Weeks    Status On-going      PT LONG TERM GOAL #3   Title Pt will be able to perform usual ADL's and yardwork without complaints in his Lt shoulder.    Status On-going                 Plan - 10/22/19 0948    Clinical Impression Statement Continued focuse on light strengthening today within pain limited range.  Overall continues to progress well with PT and will continue to benefit from PT to maximize function.    Personal Factors and Comorbidities Other   massive tear used patch to repair   Examination-Activity Limitations Carry;Lift;Reach Overhead    Examination-Participation Restrictions Cleaning;Driving;Laundry;Yard Work;Meal Prep    Stability/Clinical Decision Making Stable/Uncomplicated    Rehab  Potential Good    PT Frequency 3x / week   2-3   PT Treatment/Interventions ADLs/Self Care Home Management;Cryotherapy;Electrical Stimulation;Moist Heat;Iontophoresis 4mg /ml Dexamethasone;Ultrasound;Neuromuscular re-education;Balance training;Therapeutic exercise;Therapeutic activities;Patient/family education;Manual techniques;Dry needling;Passive range of motion;Taping;Vasopneumatic Device    PT Next Visit Plan continue per POC, work on strengthening    PT [place] [facility]: [id number], added tband rows, ext, IR,ER. Added sidelying abd, standing flexion, abd AROM (he relays he did not need picutres for these)    Consulted and Agree with Plan of Care Patient           Patient will benefit from skilled therapeutic intervention in order to improve the following deficits and impairments:  Decreased activity tolerance, Decreased mobility, Decreased range of motion, Decreased strength, Hypomobility, Increased edema, Impaired flexibility, Increased muscle spasms, Increased fascial restricitons, Impaired UE functional use, Pain  Visit Diagnosis: Muscle weakness (generalized)  Chronic left shoulder pain  Acute pain of left shoulder  Localized edema  Stiffness of left shoulder, not elsewhere classified     Problem List Patient Active Problem List   Diagnosis Date Noted  . Complete tear of left rotator cuff 08/26/2019  . AC (acromioclavicular) arthritis 08/26/2019  . Impingement syndrome of left shoulder 08/26/2019  . Biceps tendonitis on left 08/26/2019  . Right thigh pain 02/13/2019  . Pain in left shoulder 10/28/2018  . Primary osteoarthritis of both knees 01/15/2017      Eddie Young, PT, DPT 10/22/19 9:50 AM    [facility] [address] [place], [place], [address] Phone: [phone]   Fax:  [phone]  Name: Eddie Young MRN: [id number] Date of Birth: 1945-08-31

## 2019-10-24 ENCOUNTER — Ambulatory Visit (INDEPENDENT_AMBULATORY_CARE_PROVIDER_SITE_OTHER): Payer: Medicare Other | Admitting: Physical Therapy

## 2019-10-24 ENCOUNTER — Other Ambulatory Visit: Payer: Self-pay

## 2019-10-24 DIAGNOSIS — M25512 Pain in left shoulder: Secondary | ICD-10-CM | POA: Diagnosis not present

## 2019-10-24 DIAGNOSIS — M25612 Stiffness of left shoulder, not elsewhere classified: Secondary | ICD-10-CM | POA: Diagnosis not present

## 2019-10-24 DIAGNOSIS — M6281 Muscle weakness (generalized): Secondary | ICD-10-CM | POA: Diagnosis not present

## 2019-10-24 DIAGNOSIS — R600 Localized edema: Secondary | ICD-10-CM

## 2019-10-24 DIAGNOSIS — G8929 Other chronic pain: Secondary | ICD-10-CM

## 2019-10-24 NOTE — Therapy (Signed)
[facility] Physical Therapy [address] [place], [place], [address] Phone: [phone]   Fax:  [phone]  Physical Therapy Treatment  Patient Details  Name: Eddie Young MRN: [id number] Date of Birth: 27-Apr-1945 Referring Provider (PT): Durward Fortes   Encounter Date: 10/24/2019   PT End of Session - 10/24/19 0925    Visit Number 14    Number of Visits 24    Date for PT Re-Evaluation 12/11/19    Progress Note Due on Visit 16   did visit 6   PT Start Time 0847    PT Stop Time 0932    PT Time Calculation (min) 45 min    Activity Tolerance Patient tolerated treatment well;No increased pain    Behavior During Therapy WFL for tasks assessed/performed           Past Medical History:  Diagnosis Date  . GERD (gastroesophageal reflux disease)   . Hyperlipidemia   . Macular degeneration   . Thyroid disease    hyper active    Past Surgical History:  Procedure Laterality Date  . ANKLE ARTHROSCOPY WITH REPAIR SUBLUXING TENDON    . KNEE CARTILAGE SURGERY    . SHOULDER ARTHROSCOPY W/ ROTATOR CUFF REPAIR      There were no vitals filed for this visit.   Subjective Assessment - 10/24/19 0922    Subjective denies pain, knows that he is still really week in abd.    Pertinent History status post massive rotator cuff tear repair involving predominately supraspinatus but some portion of the infraspinatus, with biceps tenodesis    Limitations Writing;House hold activities;Lifting    Patient Stated Goals get my shoulder back to normal              [facility] PT Assessment - 10/24/19 0001      Assessment   Medical Diagnosis S/P Lt shoulder massive RTC repair with graft, biceps tenodesis    Referring Provider (PT) [place]    Onset Date/Surgical Date 08/21/19    Hand Dominance Right                         [facility] Adult PT Treatment/Exercise - 10/24/19 0001      Shoulder Exercises: Sidelying   ABduction Left    ABduction Weight (lbs) 2     ABduction Limitations 3 sets of 10      Shoulder Exercises: Standing   External Rotation Left    Theraband Level (Shoulder External Rotation) Level 3 (Green)    External Rotation Limitations 2X15    Internal Rotation Left    Theraband Level (Shoulder Internal Rotation) Level 3 (Green)    Internal Rotation Limitations 2X15    Flexion Both;Weights    Shoulder Flexion Weight (lbs) 1    Flexion Limitations 2 sets of 10    ABduction AROM;Both    ABduction Limitations 2 sets of 10 1# limited range on Lt -  through range without shrug    Theraband Level (Shoulder Extension) Level 4 (Blue)    Extension Limitations 2x15    Row Both;Theraband    Theraband Level (Shoulder Row) Level 4 (Blue)    Row Limitations 2X15      Shoulder Exercises: Pulleys   Flexion 3 minutes    Scaption 3 minutes      Shoulder Exercises: ROM/Strengthening   UBE (Upper Arm Bike) L3.5 Lt arm only 3 min fwd/3 min retro    Wall Wash wall ladder X 20 reps holding 5 sec for  flexion and scaption; 2# wrist weight                    PT Short Term Goals - 10/15/19 0921      PT SHORT TERM GOAL #1   Title Pt will be I and compliant with initial HEP.    Time 4    Status Achieved    Target Date 10/16/19      PT SHORT TERM GOAL #2   Title Pt will improve Lt shoulder PROM flexion >120, abd >100, ER >50    Time 4    Period Weeks    Status Achieved      PT SHORT TERM GOAL #3   Title Pt will improve Lt shoulder AROM flexion to 130 and abd to 100    Time 4    Period Weeks    Status New    Target Date 11/12/19             PT Long Term Goals - 09/26/19 1054      PT LONG TERM GOAL #1   Title Pt will improve Lt shoulder AROM to [facility]    Time 12    Period Weeks    Status On-going      PT LONG TERM GOAL #2   Title Pt will improve Lt shoulder strength to 4+ overall.    Time 12    Period Weeks    Status On-going      PT LONG TERM GOAL #3   Title Pt will be able to perform usual ADL's and yardwork  without complaints in his Lt shoulder.    Status On-going                 Plan - 10/24/19 0925    Clinical Impression Statement He is still limited with mild ROM deficits and moderate strength deficits, most limited with ABD (severe deficit). Continued to address this with PT. Continue POC    Personal Factors and Comorbidities Other   massive tear used patch to repair   Examination-Activity Limitations Carry;Lift;Reach Overhead    Examination-Participation Restrictions Cleaning;Driving;Laundry;Yard Work;Meal Prep    Stability/Clinical Decision Making Stable/Uncomplicated    Rehab Potential Good    PT Frequency 3x / week   2-3   PT Treatment/Interventions ADLs/Self Care Home Management;Cryotherapy;Electrical Stimulation;Moist Heat;Iontophoresis 4mg /ml Dexamethasone;Ultrasound;Neuromuscular re-education;Balance training;Therapeutic exercise;Therapeutic activities;Patient/family education;Manual techniques;Dry needling;Passive range of motion;Taping;Vasopneumatic Device    PT Next Visit Plan continue per POC, work on strengthening    PT [place]: [id number], added tband rows, ext, IR,ER. Added sidelying abd, standing flexion, abd AROM (he relays he did not need picutres for these)    Consulted and Agree with Plan of Care Patient           Patient will benefit from skilled therapeutic intervention in order to improve the following deficits and impairments:  Decreased activity tolerance, Decreased mobility, Decreased range of motion, Decreased strength, Hypomobility, Increased edema, Impaired flexibility, Increased muscle spasms, Increased fascial restricitons, Impaired UE functional use, Pain  Visit Diagnosis: Muscle weakness (generalized)  Chronic left shoulder pain  Acute pain of left shoulder  Localized edema  Stiffness of left shoulder, not elsewhere classified     Problem List Patient Active Problem List   Diagnosis Date Noted  . Complete tear of  left rotator cuff 08/26/2019  . AC (acromioclavicular) arthritis 08/26/2019  . Impingement syndrome of left shoulder 08/26/2019  . Biceps tendonitis on left 08/26/2019  . Right thigh  pain 02/13/2019  . Pain in left shoulder 10/28/2018  . Primary osteoarthritis of both knees 01/15/2017    [name] 10/24/2019, 10:17 AM  [facility] Physical Therapy [address] [place], [place], [address] Phone: [phone]   Fax:  [phone]  Name: Eddie Young MRN: [id number] Date of Birth: 1945-07-06

## 2019-10-27 ENCOUNTER — Ambulatory Visit (INDEPENDENT_AMBULATORY_CARE_PROVIDER_SITE_OTHER): Payer: Medicare Other | Admitting: Physical Therapy

## 2019-10-27 ENCOUNTER — Other Ambulatory Visit: Payer: Self-pay

## 2019-10-27 ENCOUNTER — Encounter: Payer: Self-pay | Admitting: Physical Therapy

## 2019-10-27 DIAGNOSIS — M6281 Muscle weakness (generalized): Secondary | ICD-10-CM | POA: Diagnosis not present

## 2019-10-27 DIAGNOSIS — R600 Localized edema: Secondary | ICD-10-CM

## 2019-10-27 DIAGNOSIS — M25512 Pain in left shoulder: Secondary | ICD-10-CM

## 2019-10-27 DIAGNOSIS — G8929 Other chronic pain: Secondary | ICD-10-CM

## 2019-10-27 DIAGNOSIS — M25612 Stiffness of left shoulder, not elsewhere classified: Secondary | ICD-10-CM

## 2019-10-27 NOTE — Therapy (Signed)
[facility] [address] [place], [place], [address] Phone: [phone]   Fax:  [phone]  Physical Therapy Treatment  Patient Details  Name: Eddie Young MRN: [id number] Date of Birth: [birth year] Referring Provider (PT): Durward Fortes   Encounter Date: 10/27/2019   PT End of Session - 10/27/19 0837    Visit Number 15    Number of Visits 24    Date for PT Re-Evaluation 12/11/19    Progress Note Due on Visit 16   did visit 6   PT Start Time 0758    PT Stop Time 0839    PT Time Calculation (min) 41 min    Activity Tolerance Patient tolerated treatment well;No increased pain    Behavior During Therapy WFL for tasks assessed/performed           Past Medical History:  Diagnosis Date  . GERD (gastroesophageal reflux disease)   . Hyperlipidemia   . Macular degeneration   . Thyroid disease    hyper active    Past Surgical History:  Procedure Laterality Date  . ANKLE ARTHROSCOPY WITH REPAIR SUBLUXING TENDON    . KNEE CARTILAGE SURGERY    . SHOULDER ARTHROSCOPY W/ ROTATOR CUFF REPAIR      There were no vitals filed for this visit.   Subjective Assessment - 10/27/19 0800    Subjective doing well, no pain in the shoulder    Pertinent History status post massive rotator cuff tear repair involving predominately supraspinatus but some portion of the infraspinatus, with biceps tenodesis    Limitations Writing;House hold activities;Lifting    Patient Stated Goals get my shoulder back to normal                             [facility] Adult PT Treatment/Exercise - 10/27/19 0758      Shoulder Exercises: Standing   External Rotation Left    Theraband Level (Shoulder External Rotation) Level 4 (Blue)    External Rotation Limitations 2X15    Internal Rotation Left    Theraband Level (Shoulder Internal Rotation) Level 4 (Blue)    Internal Rotation Limitations 2X15    Flexion Weights;Left   2x15   Shoulder Flexion Weight (lbs)  2   1# last 10 reps   ABduction Left   2x15   ABduction Limitations 0.5#    Theraband Level (Shoulder Extension) Level 4 (Blue)    Extension Limitations 2x15    Row Both;Theraband    Theraband Level (Shoulder Row) Level 4 (Blue)    Row Limitations 2X15      Shoulder Exercises: ROM/Strengthening   UBE (Upper Arm Bike) L3.5 Lt arm only 3 min fwd/3 min retro    Wall Wash wall ladder X 20 reps holding 5 sec for flexion and scaption; 2.5# wrist weight    Modified Plank Limitations at counter weight shifting to Lt 2 sets; 10x3 sec hold                    PT Short Term Goals - 10/15/19 0921      PT SHORT TERM GOAL #1   Title Pt will be I and compliant with initial HEP.    Time 4    Status Achieved    Target Date 10/16/19      PT SHORT TERM GOAL #2   Title Pt will improve Lt shoulder PROM flexion >120, abd >100, ER >50    Time 4  Period Weeks    Status Achieved      PT SHORT TERM GOAL #3   Title Pt will improve Lt shoulder AROM flexion to 130 and abd to 100    Time 4    Period Weeks    Status New    Target Date 11/12/19             PT Long Term Goals - 09/26/19 1054      PT LONG TERM GOAL #1   Title Pt will improve Lt shoulder AROM to [facility].    Time 12    Period Weeks    Status On-going      PT LONG TERM GOAL #2   Title Pt will improve Lt shoulder strength to 4+ overall.    Time 12    Period Weeks    Status On-going      PT LONG TERM GOAL #3   Title Pt will be able to perform usual ADL's and yardwork without complaints in his Lt shoulder.    Status On-going                 Plan - 10/27/19 2426    Clinical Impression Statement Overal demonstrating good progress with strengthening and is very aware of shoulder shrug and working to decrease on his own at home.  Will need progress note next visit.    Personal Factors and Comorbidities Other   massive tear used patch to repair   Examination-Activity Limitations Carry;Lift;Reach Overhead     Examination-Participation Restrictions Cleaning;Driving;Laundry;Yard Work;Meal Prep    Stability/Clinical Decision Making Stable/Uncomplicated    Rehab Potential Good    PT Frequency 3x / week   2-3   PT Treatment/Interventions ADLs/Self Care Home Management;Cryotherapy;Electrical Stimulation;Moist Heat;Iontophoresis 4mg /ml Dexamethasone;Ultrasound;Neuromuscular re-education;Balance training;Therapeutic exercise;Therapeutic activities;Patient/family education;Manual techniques;Dry needling;Passive range of motion;Taping;Vasopneumatic Device    PT Next Visit Plan continue per POC, work on strengthening; needs progress note    PT Home Exercise Plan Access Code: [email or web address], added tband rows, ext, IR,ER. Added sidelying abd, standing flexion, abd AROM (he relays he did not need picutres for these)    Consulted and Agree with Plan of Care Patient           Patient will benefit from skilled therapeutic intervention in order to improve the following deficits and impairments:  Decreased activity tolerance, Decreased mobility, Decreased range of motion, Decreased strength, Hypomobility, Increased edema, Impaired flexibility, Increased muscle spasms, Increased fascial restricitons, Impaired UE functional use, Pain  Visit Diagnosis: Muscle weakness (generalized)  Chronic left shoulder pain  Acute pain of left shoulder  Localized edema  Stiffness of left shoulder, not elsewhere classified     Problem List Patient Active Problem List   Diagnosis Date Noted  . Complete tear of left rotator cuff 08/26/2019  . AC (acromioclavicular) arthritis 08/26/2019  . Impingement syndrome of left shoulder 08/26/2019  . Biceps tendonitis on left 08/26/2019  . Right thigh pain 02/13/2019  . Pain in left shoulder 10/28/2018  . Primary osteoarthritis of both knees 01/15/2017       [name], PT, DPT 10/27/19 8:42 AM    [facility] Physical Therapy [address] [place], [place], [address] Phone: [phone]   Fax:  [phone]  Name: Eddie Young MRN: [id number] Date of Birth: [birth year]

## 2019-10-29 ENCOUNTER — Other Ambulatory Visit: Payer: Self-pay

## 2019-10-29 ENCOUNTER — Ambulatory Visit (INDEPENDENT_AMBULATORY_CARE_PROVIDER_SITE_OTHER): Payer: Medicare Other | Admitting: Physical Therapy

## 2019-10-29 DIAGNOSIS — M25612 Stiffness of left shoulder, not elsewhere classified: Secondary | ICD-10-CM | POA: Diagnosis not present

## 2019-10-29 DIAGNOSIS — M6281 Muscle weakness (generalized): Secondary | ICD-10-CM

## 2019-10-29 DIAGNOSIS — R600 Localized edema: Secondary | ICD-10-CM | POA: Diagnosis not present

## 2019-10-29 DIAGNOSIS — M25512 Pain in left shoulder: Secondary | ICD-10-CM | POA: Diagnosis not present

## 2019-10-29 DIAGNOSIS — G8929 Other chronic pain: Secondary | ICD-10-CM

## 2019-10-29 NOTE — Therapy (Signed)
[facility] [address] [place], [place], [address] Phone: [phone]   Fax:  [phone]  Physical Therapy Treatment/Progress note Progress Note reporting period 09/18/19 to 10/29/19  See below for objective and subjective measurements relating to patients progress with PT.   Patient Details  Name: Eddie Young MRN: [id number] Date of Birth: 04-26-1945 Referring Provider (PT): Durward Fortes   Encounter Date: 10/29/2019   PT End of Session - 10/29/19 1507    Visit Number 16    Number of Visits 24    Date for PT Re-Evaluation 12/11/19    Progress Note Due on Visit 26   did visit 6   PT Start Time 1430    PT Stop Time 1511    PT Time Calculation (min) 41 min    Activity Tolerance Patient tolerated treatment well;No increased pain    Behavior During Therapy WFL for tasks assessed/performed           Past Medical History:  Diagnosis Date  . GERD (gastroesophageal reflux disease)   . Hyperlipidemia   . Macular degeneration   . Thyroid disease    hyper active    Past Surgical History:  Procedure Laterality Date  . ANKLE ARTHROSCOPY WITH REPAIR SUBLUXING TENDON    . KNEE CARTILAGE SURGERY    . SHOULDER ARTHROSCOPY W/ ROTATOR CUFF REPAIR      There were no vitals filed for this visit.   Subjective Assessment - 10/29/19 1446    Subjective doing well, no pain in the shoulder to report    Pertinent History status post massive rotator cuff tear repair involving predominately supraspinatus but some portion of the infraspinatus, with biceps tenodesis    Limitations Writing;House hold activities;Lifting    Patient Stated Goals get my shoulder back to normal              [facility] PT Assessment - 10/29/19 0001      Assessment   Medical Diagnosis S/P Lt shoulder massive RTC repair with graft, biceps tenodesis    Referring Provider (PT) [place]    Onset Date/Surgical Date 08/21/19    Hand Dominance Right      AROM   Overall AROM  Comments standing    Left Shoulder Flexion 145 Degrees    Left Shoulder ABduction 91 Degrees      PROM   Left Shoulder Flexion 165 Degrees    Left Shoulder ABduction 160 Degrees    Left Shoulder Internal Rotation 66 Degrees    Left Shoulder External Rotation 62 Degrees      Strength   Left Shoulder Flexion 4-/5    Left Shoulder ABduction 3+/5    Left Shoulder Internal Rotation 4+/5    Left Shoulder External Rotation 4+/5                         [facility] Adult PT Treatment/Exercise - 10/29/19 0001      Shoulder Exercises: Sidelying   ABduction Left    ABduction Weight (lbs) 3    ABduction Limitations 3 sets of 10      Shoulder Exercises: Standing   External Rotation Left    Theraband Level (Shoulder External Rotation) Level 4 (Blue)    External Rotation Limitations 2X15    Internal Rotation Left    Theraband Level (Shoulder Internal Rotation) Level 4 (Blue)    Internal Rotation Limitations 2X15    Shoulder Flexion Weight (lbs) 2.5    Flexion Limitations reps of 10, 5,  5    ABduction Left    Shoulder ABduction Weight (lbs) 1    ABduction Limitations reps of 10, 5, 5    Theraband Level (Shoulder Extension) Level 4 (Blue)    Extension Limitations 2x15    Row Both;Theraband    Theraband Level (Shoulder Row) Level 4 (Blue)    Row Limitations 2X15    Other Standing Exercises OH stabillity ball rolls 2# X 15 all planes and cirlces      Shoulder Exercises: Pulleys   Flexion 2 minutes    Scaption 2 minutes      Shoulder Exercises: ROM/Strengthening   UBE (Upper Arm Bike) L4 Lt arm only 3 min fwd/3 min retro    Wall Wash wall ladder X 10 reps holding 5 sec for flexion and scaption; 2.5# wrist weight                    PT Short Term Goals - 10/15/19 0921      PT SHORT TERM GOAL #1   Title Pt will be I and compliant with initial HEP.    Time 4    Status Achieved    Target Date 10/16/19      PT SHORT TERM GOAL #2   Title Pt will improve Lt  shoulder PROM flexion >120, abd >100, ER >50    Time 4    Period Weeks    Status Achieved      PT SHORT TERM GOAL #3   Title Pt will improve Lt shoulder AROM flexion to 130 and abd to 100    Time 4    Period Weeks    Status New    Target Date 11/12/19             PT Long Term Goals - 10/29/19 1521      PT LONG TERM GOAL #1   Title Pt will improve Lt shoulder AROM to [facility].    Baseline still lacks abd AROM in standing due to weakness    Time 12    Period Weeks    Status On-going      PT LONG TERM GOAL #2   Title Pt will improve Lt shoulder strength to 4+ overall.    Baseline 3+ for abd, 4- now for flexion    Time 12    Period Weeks    Status On-going      PT LONG TERM GOAL #3   Title Pt will be able to perform usual ADL's and yardwork without complaints in his Lt shoulder.    Status On-going                 Plan - 10/29/19 1508    Clinical Impression Statement Able to show improvements in ROM and strength with updated measurements. Able to progress resistance today with good tolerance and without complaints. Overall progressing as expected and will continue to benefit from PT to improve functional strength.    Personal Factors and Comorbidities Other   massive tear used patch to repair   Examination-Activity Limitations Carry;Lift;Reach Overhead    Examination-Participation Restrictions Cleaning;Driving;Laundry;Yard Work;Meal Prep    Stability/Clinical Decision Making Stable/Uncomplicated    Rehab Potential Good    PT Frequency 3x / week   2-3   PT Treatment/Interventions ADLs/Self Care Home Management;Cryotherapy;Electrical Stimulation;Moist Heat;Iontophoresis 4mg /ml Dexamethasone;Ultrasound;Neuromuscular re-education;Balance training;Therapeutic exercise;Therapeutic activities;Patient/family education;Manual techniques;Dry needling;Passive range of motion;Taping;Vasopneumatic Device    PT Next Visit Plan continue per POC, work on strengthening; needs progress  note  PT Home Exercise Plan Access Code: [email or web address], added tband rows, ext, IR,ER. Added sidelying abd, standing flexion, abd AROM (he relays he did not need picutres for these)    Consulted and Agree with Plan of Care Patient           Patient will benefit from skilled therapeutic intervention in order to improve the following deficits and impairments:  Decreased activity tolerance, Decreased mobility, Decreased range of motion, Decreased strength, Hypomobility, Increased edema, Impaired flexibility, Increased muscle spasms, Increased fascial restricitons, Impaired UE functional use, Pain  Visit Diagnosis: Muscle weakness (generalized)  Chronic left shoulder pain  Acute pain of left shoulder  Localized edema  Stiffness of left shoulder, not elsewhere classified     Problem List Patient Active Problem List   Diagnosis Date Noted  . Complete tear of left rotator cuff 08/26/2019  . AC (acromioclavicular) arthritis 08/26/2019  . Impingement syndrome of left shoulder 08/26/2019  . Biceps tendonitis on left 08/26/2019  . Right thigh pain 02/13/2019  . Pain in left shoulder 10/28/2018  . Primary osteoarthritis of both knees 01/15/2017    [name] 10/29/2019, 3:22 PM  [facility] [address] [place], [place], [address] Phone: [phone]   Fax:  [phone]  Name: Eddie Young MRN: [id number] Date of Birth: [birth year]

## 2019-10-31 ENCOUNTER — Other Ambulatory Visit: Payer: Self-pay

## 2019-10-31 ENCOUNTER — Encounter: Payer: Self-pay | Admitting: Rehabilitative and Restorative Service Providers"

## 2019-10-31 ENCOUNTER — Ambulatory Visit (INDEPENDENT_AMBULATORY_CARE_PROVIDER_SITE_OTHER): Payer: Medicare Other | Admitting: Rehabilitative and Restorative Service Providers"

## 2019-10-31 DIAGNOSIS — R600 Localized edema: Secondary | ICD-10-CM | POA: Diagnosis not present

## 2019-10-31 DIAGNOSIS — M6281 Muscle weakness (generalized): Secondary | ICD-10-CM

## 2019-10-31 DIAGNOSIS — M25612 Stiffness of left shoulder, not elsewhere classified: Secondary | ICD-10-CM | POA: Diagnosis not present

## 2019-10-31 DIAGNOSIS — M25512 Pain in left shoulder: Secondary | ICD-10-CM | POA: Diagnosis not present

## 2019-10-31 NOTE — Therapy (Signed)
[facility] [address] [place], [place], [address] Phone: [phone]   Fax:  [phone]  Physical Therapy Treatment  Patient Details  Name: Eddie Young MRN: [id number] Date of Birth: 1945-04-25 Referring Provider (PT): Durward Fortes   Encounter Date: 10/31/2019   PT End of Session - 10/31/19 1009    Visit Number 17    Number of Visits 24    Date for PT Re-Evaluation 12/11/19    Authorization Type KX REQUIRED    Authorization - Visit Number 17    Progress Note Due on Visit 26   did visit 6   PT Start Time 1011    PT Stop Time 1050    PT Time Calculation (min) 39 min    Activity Tolerance Patient tolerated treatment well;No increased pain    Behavior During Therapy WFL for tasks assessed/performed           Past Medical History:  Diagnosis Date  . GERD (gastroesophageal reflux disease)   . Hyperlipidemia   . Macular degeneration   . Thyroid disease    hyper active    Past Surgical History:  Procedure Laterality Date  . ANKLE ARTHROSCOPY WITH REPAIR SUBLUXING TENDON    . KNEE CARTILAGE SURGERY    . SHOULDER ARTHROSCOPY W/ ROTATOR CUFF REPAIR      There were no vitals filed for this visit.   Subjective Assessment - 10/31/19 1013    Subjective Pt. stated doing fairly well.   Difficulty reported c out to side movement, c resistance.    Pertinent History status post massive rotator cuff tear repair involving predominately supraspinatus but some portion of the infraspinatus, with biceps tenodesis    Limitations Writing;House hold activities;Lifting    Patient Stated Goals get my shoulder back to normal    Currently in Pain? No/denies                             [facility] Adult PT Treatment/Exercise - 10/31/19 0001      Shoulder Exercises: Prone   Other Prone Exercises prone y, t 2 x 10 each Lt UE      Shoulder Exercises: Sidelying   Flexion Left   1 lb 2 x 15   ABduction Left   3 x 15   ABduction Weight  (lbs) 3      Shoulder Exercises: Standing   Extension 20 reps    Theraband Level (Shoulder Extension) Level 4 (Blue)    Row 20 reps    Theraband Level (Shoulder Row) Level 4 (Blue)      Shoulder Exercises: Pulleys   Flexion 2 minutes    Scaption 2 minutes      Shoulder Exercises: ROM/Strengthening   UBE (Upper Arm Bike) Lvl 4 3 mins fwd/back Lt UE only      Manual Therapy   Manual therapy comments scapular mobilizations for upward rotation, retraction Lt                     PT Short Term Goals - 10/15/19 9924      PT SHORT TERM GOAL #1   Title Pt will be I and compliant with initial HEP.    Time 4    Status Achieved    Target Date 10/16/19      PT SHORT TERM GOAL #2   Title Pt will improve Lt shoulder PROM flexion >120, abd >100, ER >50    Time 4  Period Weeks    Status Achieved      PT SHORT TERM GOAL #3   Title Pt will improve Lt shoulder AROM flexion to 130 and abd to 100    Time 4    Period Weeks    Status New    Target Date 11/12/19             PT Long Term Goals - 10/29/19 1521      PT LONG TERM GOAL #1   Title Pt will improve Lt shoulder AROM to [facility].    Baseline still lacks abd AROM in standing due to weakness    Time 12    Period Weeks    Status On-going      PT LONG TERM GOAL #2   Title Pt will improve Lt shoulder strength to 4+ overall.    Baseline 3+ for abd, 4- now for flexion    Time 12    Period Weeks    Status On-going      PT LONG TERM GOAL #3   Title Pt will be able to perform usual ADL's and yardwork without complaints in his Lt shoulder.    Status On-going                 Plan - 10/31/19 1013    Clinical Impression Statement Elevation against gravity demonstrated shrug/elevation of Lt shoulder in flexion, scaption and abduction.  Scapular upward rotation limited upon assessment.    Personal Factors and Comorbidities Other   massive tear used patch to repair   Examination-Activity Limitations Carry;Lift;Reach  Overhead    Examination-Participation Restrictions Cleaning;Driving;Laundry;Yard Work;Meal Prep    Stability/Clinical Decision Making Stable/Uncomplicated    Rehab Potential Good    PT Frequency 3x / week   2-3   PT Treatment/Interventions ADLs/Self Care Home Management;Cryotherapy;Electrical Stimulation;Moist Heat;Iontophoresis 4mg /ml Dexamethasone;Ultrasound;Neuromuscular re-education;Balance training;Therapeutic exercise;Therapeutic activities;Patient/family education;Manual techniques;Dry needling;Passive range of motion;Taping;Vasopneumatic Device    PT Next Visit Plan Scapular upward rotation mobility, strengthening to reduce shrug.  KX REQUIRED    PT Home Exercise Plan Access Code: [email or web address], added tband rows, ext, IR,ER. Added sidelying abd, standing flexion, abd AROM (he relays he did not need picutres for these)    Consulted and Agree with Plan of Care Patient           Patient will benefit from skilled therapeutic intervention in order to improve the following deficits and impairments:  Decreased activity tolerance, Decreased mobility, Decreased range of motion, Decreased strength, Hypomobility, Increased edema, Impaired flexibility, Increased muscle spasms, Increased fascial restricitons, Impaired UE functional use, Pain  Visit Diagnosis: Acute pain of left shoulder  Muscle weakness (generalized)  Localized edema  Stiffness of left shoulder, not elsewhere classified     Problem List Patient Active Problem List   Diagnosis Date Noted  . Complete tear of left rotator cuff 08/26/2019  . AC (acromioclavicular) arthritis 08/26/2019  . Impingement syndrome of left shoulder 08/26/2019  . Biceps tendonitis on left 08/26/2019  . Right thigh pain 02/13/2019  . Pain in left shoulder 10/28/2018  . Primary osteoarthritis of both knees 01/15/2017    Scot Jun, PT, DPT, OCS, ATC 10/31/19  10:44 AM    [facility] [address] [place], [place], [address] Phone: [phone]   Fax:  [phone]  Name: SHERMON BOZZI MRN: [id number] Date of Birth: [birth year]

## 2019-11-03 ENCOUNTER — Other Ambulatory Visit: Payer: Self-pay

## 2019-11-03 ENCOUNTER — Ambulatory Visit (INDEPENDENT_AMBULATORY_CARE_PROVIDER_SITE_OTHER): Payer: Medicare Other | Admitting: Physical Therapy

## 2019-11-03 DIAGNOSIS — M25612 Stiffness of left shoulder, not elsewhere classified: Secondary | ICD-10-CM

## 2019-11-03 DIAGNOSIS — M6281 Muscle weakness (generalized): Secondary | ICD-10-CM

## 2019-11-03 DIAGNOSIS — R600 Localized edema: Secondary | ICD-10-CM

## 2019-11-03 DIAGNOSIS — M25512 Pain in left shoulder: Secondary | ICD-10-CM

## 2019-11-03 NOTE — Therapy (Signed)
[facility] [address] [place], [place], [address] Phone: [phone]   Fax:  [phone]  Physical Therapy Treatment  Patient Details  Name: Eddie Young MRN: [id number] Date of Birth: 07/16/1945 Referring Provider (PT): Durward Fortes   Encounter Date: 11/03/2019   PT End of Session - 11/03/19 0914    Visit Number 18    Number of Visits 24    Date for PT Re-Evaluation 12/11/19    Authorization Type KX REQUIRED    Authorization - Visit Number 18    Progress Note Due on Visit 26   did visit 6   PT Start Time 0845    PT Stop Time 0925    PT Time Calculation (min) 40 min    Activity Tolerance Patient tolerated treatment well;No increased pain    Behavior During Therapy WFL for tasks assessed/performed           Past Medical History:  Diagnosis Date   GERD (gastroesophageal reflux disease)    Hyperlipidemia    Macular degeneration    Thyroid disease    hyper active    Past Surgical History:  Procedure Laterality Date   ANKLE ARTHROSCOPY WITH REPAIR SUBLUXING TENDON     KNEE CARTILAGE SURGERY     SHOULDER ARTHROSCOPY W/ ROTATOR CUFF REPAIR      There were no vitals filed for this visit.       [facility] Adult PT Treatment/Exercise - 11/03/19 0001      Shoulder Exercises: Sidelying   ABduction Weight (lbs) 3    ABduction Limitations 3 sets of 15      Shoulder Exercises: Standing   External Rotation Left    Theraband Level (Shoulder External Rotation) Level 4 (Blue)    External Rotation Limitations 2X15    Internal Rotation Left    Theraband Level (Shoulder Internal Rotation) Level 4 (Blue)    Internal Rotation Limitations 2X15    ABduction Left    Shoulder ABduction Weight (lbs) 1    ABduction Limitations 2 sets of 10    Extension Both    Theraband Level (Shoulder Extension) Level 4 (Blue)    Extension Limitations 2x15    Row Both    Theraband Level (Shoulder Row) Level 4 (Blue)    Row Limitations 2X15     Other Standing Exercises OH stabillity ball rolls 2# X 15 all planes and cirlces      Shoulder Exercises: Pulleys   Flexion 3 minutes    Scaption 3 minutes      Shoulder Exercises: ROM/Strengthening   [place]anger 3 lb cuff weight for circles X 15 ea, then scaption x 15 reps - working on decreasing shoulder shrug    Wall Wash wall ladder X 10 reps holding 5 sec for flexion and scaption; 23# wrist weight              PT Short Term Goals - 10/15/19 0347      PT SHORT TERM GOAL #1   Title Pt will be I and compliant with initial HEP.    Time 4    Status Achieved    Target Date 10/16/19      PT SHORT TERM GOAL #2   Title Pt will improve Lt shoulder PROM flexion >120, abd >100, ER >50    Time 4    Period Weeks    Status Achieved      PT SHORT TERM GOAL #3   Title Pt will improve Lt shoulder AROM flexion to 130 and  abd to 100    Time 4    Period Weeks    Status New    Target Date 11/12/19             PT Long Term Goals - 10/29/19 1521      PT LONG TERM GOAL #1   Title Pt will improve Lt shoulder AROM to [facility].    Baseline still lacks abd AROM in standing due to weakness    Time 12    Period Weeks    Status On-going      PT LONG TERM GOAL #2   Title Pt will improve Lt shoulder strength to 4+ overall.    Baseline 3+ for abd, 4- now for flexion    Time 12    Period Weeks    Status On-going      PT LONG TERM GOAL #3   Title Pt will be able to perform usual ADL's and yardwork without complaints in his Lt shoulder.    Status On-going                 Plan - 11/03/19 0926    Clinical Impression Statement Continued to address impairments in strength and ROM in Lt shoulder. Abduction continues to be his biggest weakness and ER continues to be his most limited ROM plane. PT will continue to address this as able.    Personal Factors and Comorbidities Other   massive tear used patch to repair   Examination-Activity Limitations Carry;Lift;Reach Overhead     Examination-Participation Restrictions Cleaning;Driving;Laundry;Yard Work;Meal Prep    Stability/Clinical Decision Making Stable/Uncomplicated    Rehab Potential Good    PT Frequency 3x / week   2-3   PT Treatment/Interventions ADLs/Self Care Home Management;Cryotherapy;Electrical Stimulation;Moist Heat;Iontophoresis 4mg /ml Dexamethasone;Ultrasound;Neuromuscular re-education;Balance training;Therapeutic exercise;Therapeutic activities;Patient/family education;Manual techniques;Dry needling;Passive range of motion;Taping;Vasopneumatic Device    PT Next Visit Plan Scapular upward rotation mobility, strengthening to reduce shrug.  KX REQUIRED    PT Home Exercise Plan Access Code: [email or web address], added tband rows, ext, IR,ER. Added sidelying abd, standing flexion, abd AROM (he relays he did not need picutres for these)    Consulted and Agree with Plan of Care Patient           Patient will benefit from skilled therapeutic intervention in order to improve the following deficits and impairments:  Decreased activity tolerance, Decreased mobility, Decreased range of motion, Decreased strength, Hypomobility, Increased edema, Impaired flexibility, Increased muscle spasms, Increased fascial restricitons, Impaired UE functional use, Pain  Visit Diagnosis: Acute pain of left shoulder  Muscle weakness (generalized)  Localized edema  Stiffness of left shoulder, not elsewhere classified     Problem List Patient Active Problem List   Diagnosis Date Noted   Complete tear of left rotator cuff 08/26/2019   AC (acromioclavicular) arthritis 08/26/2019   Impingement syndrome of left shoulder 08/26/2019   Biceps tendonitis on left 08/26/2019   Right thigh pain 02/13/2019   Pain in left shoulder 10/28/2018   Primary osteoarthritis of both knees 01/15/2017    [name] 11/03/2019, 9:29 AM  [facility] Physical Therapy [address] [place], [place], [address] Phone:  [phone]   Fax:  [phone]  Name: DONIEL MAIELLO MRN: [id number] Date of Birth: 1945-09-27

## 2019-11-05 ENCOUNTER — Ambulatory Visit (INDEPENDENT_AMBULATORY_CARE_PROVIDER_SITE_OTHER): Payer: Medicare Other | Admitting: Physical Therapy

## 2019-11-05 ENCOUNTER — Other Ambulatory Visit: Payer: Self-pay

## 2019-11-05 DIAGNOSIS — M25612 Stiffness of left shoulder, not elsewhere classified: Secondary | ICD-10-CM | POA: Diagnosis not present

## 2019-11-05 DIAGNOSIS — R600 Localized edema: Secondary | ICD-10-CM | POA: Diagnosis not present

## 2019-11-05 DIAGNOSIS — M6281 Muscle weakness (generalized): Secondary | ICD-10-CM

## 2019-11-05 DIAGNOSIS — G8929 Other chronic pain: Secondary | ICD-10-CM

## 2019-11-05 DIAGNOSIS — M25512 Pain in left shoulder: Secondary | ICD-10-CM | POA: Diagnosis not present

## 2019-11-05 NOTE — Therapy (Signed)
[facility] [address] [place], [place], [address] Phone: [phone]   Fax:  [phone]  Physical Therapy Treatment/Progress note Progress Note reporting period 10/06/19 to 11/05/19  See below for objective and subjective measurements relating to patients progress with PT.   Patient Details  Name: Eddie Young MRN: [id number] Date of Birth: [birth year] Referring Provider (PT): Durward Fortes   Encounter Date: 11/05/2019   PT End of Session - 11/05/19 0901    Visit Number 19    Number of Visits 24    Date for PT Re-Evaluation 12/11/19    Authorization Type KX REQUIRED    Authorization - Visit Number 19    Progress Note Due on Visit 29   did visit 6   PT Start Time 0845    PT Stop Time 0927    PT Time Calculation (min) 42 min    Activity Tolerance Patient tolerated treatment well;No increased pain    Behavior During Therapy WFL for tasks assessed/performed           Past Medical History:  Diagnosis Date  . GERD (gastroesophageal reflux disease)   . Hyperlipidemia   . Macular degeneration   . Thyroid disease    hyper active    Past Surgical History:  Procedure Laterality Date  . ANKLE ARTHROSCOPY WITH REPAIR SUBLUXING TENDON    . KNEE CARTILAGE SURGERY    . SHOULDER ARTHROSCOPY W/ ROTATOR CUFF REPAIR      There were no vitals filed for this visit.       [facility] PT Assessment - 11/05/19 0001      Assessment   Medical Diagnosis S/P Lt shoulder massive RTC repair with graft, biceps tenodesis    Referring Provider (PT) [place]    Onset Date/Surgical Date 08/21/19    Hand Dominance Right    Next MD Visit 10/28      AROM   Left Shoulder Flexion 150 Degrees    Left Shoulder ABduction 113 Degrees    Left Shoulder Internal Rotation --   [facility]   Left Shoulder External Rotation --   C5 reaching behind head     PROM   Left Shoulder Flexion 170 Degrees    Left Shoulder ABduction 160 Degrees    Left Shoulder Internal  Rotation 70 Degrees    Left Shoulder External Rotation 66 Degrees      Strength   Left Shoulder Flexion 4/5    Left Shoulder ABduction 4/5    Left Shoulder Internal Rotation 5/5    Left Shoulder External Rotation 4+/5                         [facility] Adult PT Treatment/Exercise - 11/05/19 0001      Shoulder Exercises: Prone   Other Prone Exercises prone y, t 2 x 10 each Lt UE      Shoulder Exercises: Sidelying   ABduction Weight (lbs) 3    ABduction Limitations 3 sets of 15      Shoulder Exercises: Standing   External Rotation Left    Theraband Level (Shoulder External Rotation) Level 4 (Blue)    External Rotation Limitations 2X15    Internal Rotation Left    Theraband Level (Shoulder Internal Rotation) Level 4 (Blue)    Internal Rotation Limitations 2X15    Flexion Left    Shoulder Flexion Weight (lbs) 3    Flexion Limitations 2 sets of 10    ABduction Left    ABduction Limitations  2#5 reps, 1# 10 reps, no weight 15 reps    Other Standing Exercises OH stabillity ball rolls 2# X 15 all planes and cirlces      Shoulder Exercises: Pulleys   Flexion 3 minutes    Scaption 3 minutes      Shoulder Exercises: ROM/Strengthening   UBE (Upper Arm Bike) Lvl 5 3 mins fwd/back Lt UE only                    PT Short Term Goals - 11/05/19 0930      PT SHORT TERM GOAL #1   Title Pt will be I and compliant with initial HEP.    Time 4    Status Achieved    Target Date 10/16/19      PT SHORT TERM GOAL #2   Title Pt will improve Lt shoulder PROM flexion >120, abd >100, ER >50    Time 4    Period Weeks    Status Achieved      PT SHORT TERM GOAL #3   Title Pt will improve Lt shoulder AROM flexion to 130 and abd to 100    Time 4    Period Weeks    Status Achieved    Target Date 11/12/19             PT Long Term Goals - 10/29/19 1521      PT LONG TERM GOAL #1   Title Pt will improve Lt shoulder AROM to [facility].    Baseline still lacks abd AROM in  standing due to weakness    Time 12    Period Weeks    Status On-going      PT LONG TERM GOAL #2   Title Pt will improve Lt shoulder strength to 4+ overall.    Baseline 3+ for abd, 4- now for flexion    Time 12    Period Weeks    Status On-going      PT LONG TERM GOAL #3   Title Pt will be able to perform usual ADL's and yardwork without complaints in his Lt shoulder.    Status On-going                 Plan - 11/05/19 0929    Clinical Impression Statement He has made excellent progress with PT S/P major RTC repair. He does still lack mild ROM and moderate strength impairments which are expected after this surgery. He is however steady progressing in PT with this and will continue to benefit from PT for more strenghtening.    Personal Factors and Comorbidities Other   massive tear used patch to repair   Examination-Activity Limitations Carry;Lift;Reach Overhead    Examination-Participation Restrictions Cleaning;Driving;Laundry;Yard Work;Meal Prep    Stability/Clinical Decision Making Stable/Uncomplicated    Rehab Potential Good    PT Frequency 3x / week   2-3   PT Treatment/Interventions ADLs/Self Care Home Management;Cryotherapy;Electrical Stimulation;Moist Heat;Iontophoresis 4mg /ml Dexamethasone;Ultrasound;Neuromuscular re-education;Balance training;Therapeutic exercise;Therapeutic activities;Patient/family education;Manual techniques;Dry needling;Passive range of motion;Taping;Vasopneumatic Device    PT Next Visit Plan Scapular upward rotation mobility, strengthening to reduce shrug.  KX REQUIRED    PT Home Exercise Plan Access Code: [email or web address], added tband rows, ext, IR,ER. Added sidelying abd, standing flexion, abd AROM (he relays he did not need picutres for these)    Consulted and Agree with Plan of Care Patient           Patient will benefit from skilled therapeutic intervention in order to improve the  following deficits and impairments:  Decreased activity tolerance,  Decreased mobility, Decreased range of motion, Decreased strength, Hypomobility, Increased edema, Impaired flexibility, Increased muscle spasms, Increased fascial restricitons, Impaired UE functional use, Pain  Visit Diagnosis: Acute pain of left shoulder  Muscle weakness (generalized)  Localized edema  Stiffness of left shoulder, not elsewhere classified  Chronic left shoulder pain     Problem List Patient Active Problem List   Diagnosis Date Noted  . Complete tear of left rotator cuff 08/26/2019  . AC (acromioclavicular) arthritis 08/26/2019  . Impingement syndrome of left shoulder 08/26/2019  . Biceps tendonitis on left 08/26/2019  . Right thigh pain 02/13/2019  . Pain in left shoulder 10/28/2018  . Primary osteoarthritis of both knees 01/15/2017    [name] 11/05/2019, 9:40 AM  [facility] Physical Therapy [address] [place], [place], [address] Phone: [phone]   Fax:  [phone]  Name: TUNIS GENTLE MRN: [id number] Date of Birth: 02/07/1945

## 2019-11-06 ENCOUNTER — Encounter: Payer: Self-pay | Admitting: Orthopaedic Surgery

## 2019-11-06 ENCOUNTER — Ambulatory Visit (INDEPENDENT_AMBULATORY_CARE_PROVIDER_SITE_OTHER): Payer: Medicare Other | Admitting: Orthopaedic Surgery

## 2019-11-06 VITALS — Ht 71.0 in | Wt 195.0 lb

## 2019-11-06 DIAGNOSIS — M19012 Primary osteoarthritis, left shoulder: Secondary | ICD-10-CM

## 2019-11-06 DIAGNOSIS — M75122 Complete rotator cuff tear or rupture of left shoulder, not specified as traumatic: Secondary | ICD-10-CM

## 2019-11-06 NOTE — Progress Notes (Signed)
Office Visit Note   Patient: Eddie Young           Date of Birth: Nov 13, 1945           MRN: [id number] Visit Date: 11/06/2019              Requested by: Stephens Shire, MD [address] [place],  [place] [address] PCP: Stephens Shire, MD   Assessment & Plan: Visit Diagnoses:  1. Complete tear of left rotator cuff, unspecified whether traumatic   2. Arthritis of left acromioclavicular joint     Plan:  #1: Continue physical therapy to increase his strength. #2: Follow back up 6 weeks for recheck evaluation.  Follow-Up Instructions: Return in about 6 weeks (around 12/18/2019).   Orders:  No orders of the defined types were placed in this encounter.  No orders of the defined types were placed in this encounter.     Procedures: No procedures performed   Clinical Data: No additional findings.   Subjective: Chief Complaint  Patient presents with  . Left Shoulder - Follow-up    Left shoulder scope 08/21/2019   HPI Patient presents today for a one month follow up on his left shoulder. He had a shoulder arthroscopy on 08/21/2019. He has been going to physical therapy and states that he will finish that tomorrow. He said that his shoulder is weak. He has no pain, and has not had any since the beginning. He is right hand dominant.    Review of Systems  Constitutional: Negative for fatigue.  HENT: Negative for ear pain.   Eyes: Negative for pain.  Respiratory: Negative for shortness of breath.   Cardiovascular: Negative for leg swelling.  Gastrointestinal: Negative for constipation and diarrhea.  Endocrine: Negative for cold intolerance and heat intolerance.  Genitourinary: Negative for difficulty urinating.  Musculoskeletal: Negative for joint swelling.  Skin: Negative for rash.  Allergic/Immunologic: Negative for food allergies.  Neurological: Negative for weakness.  Hematological: Does not bruise/bleed easily.  Psychiatric/Behavioral: Negative  for sleep disturbance.     Objective: Vital Signs: Ht 5\' 11"  (1.803 m)   Wt 195 lb (88.5 kg)   BMI 27.20 kg/m   Physical Exam  Ortho Exam  Exam today reveals increased motion in the shoulder.  Still has a little bit of atrophy mild to moderate in the supraspinatus.  He holds the arm at 90 degrees of abduction and I do have to really work to get him to to resist but it does well but I can still break him.  He has not gained full motion and forward flexion or abduction but certainly is improving.  Could not get him to have any pain with exam.  Neuro vas intact distally.  Specialty Comments:  No specialty comments available.  Imaging: No results found.   PMFS History: Current Outpatient Medications  Medication Sig Dispense Refill  . aspirin 325 MG tablet Take 325 mg by mouth daily.    . calcium carbonate (OS-CAL) 600 MG TABS tablet Take 600 mg by mouth daily with breakfast.    . esomeprazole (NEXIUM) 40 MG capsule Take 40 mg by mouth daily at 12 noon.    Marland Kitchen GLUCOSAMINE HCL PO Take 2,000 mg by mouth daily.    . methimazole (TAPAZOLE) 5 MG tablet Take 5 mg by mouth daily.    . Multiple Vitamins-Minerals (ICAPS) TABS Take by mouth.    . Omega-3 Fatty Acids (FISH OIL) 1000 MG CAPS Take 1 capsule  by mouth daily.    . rosuvastatin (CRESTOR) 20 MG tablet Take 10 mg by mouth daily.    . Saw Palmetto 450 MG CAPS Take 1 capsule by mouth daily.    . vitamin E (VITAMIN E) 400 UNIT capsule Take 400 Units by mouth daily.     No current facility-administered medications for this visit.    Patient Active Problem List   Diagnosis Date Noted  . Complete tear of left rotator cuff 08/26/2019  . AC (acromioclavicular) arthritis 08/26/2019  . Impingement syndrome of left shoulder 08/26/2019  . Biceps tendonitis on left 08/26/2019  . Right thigh pain 02/13/2019  . Pain in left shoulder 10/28/2018  . Primary osteoarthritis of both knees 01/15/2017   Past Medical History:  Diagnosis Date  .  GERD (gastroesophageal reflux disease)   . Hyperlipidemia   . Macular degeneration   . Thyroid disease    hyper active    Family History  Problem Relation Age of Onset  . Thyroid cancer Maternal Grandmother   . Colon cancer Neg Hx     Past Surgical History:  Procedure Laterality Date  . ANKLE ARTHROSCOPY WITH REPAIR SUBLUXING TENDON    . KNEE CARTILAGE SURGERY    . SHOULDER ARTHROSCOPY W/ ROTATOR CUFF REPAIR     Social History   Occupational History  . Not on file  Tobacco Use  . Smoking status: Never Smoker  . Smokeless tobacco: Never Used  Vaping Use  . Vaping Use: Never used  Substance and Sexual Activity  . Alcohol use: Yes    Alcohol/week: 3.0 standard drinks    Types: 3 Cans of beer per week  . Drug use: No  . Sexual activity: Not on file

## 2019-11-07 ENCOUNTER — Other Ambulatory Visit: Payer: Self-pay

## 2019-11-07 ENCOUNTER — Encounter: Payer: Self-pay | Admitting: Physical Therapy

## 2019-11-07 ENCOUNTER — Ambulatory Visit (INDEPENDENT_AMBULATORY_CARE_PROVIDER_SITE_OTHER): Payer: Medicare Other | Admitting: Physical Therapy

## 2019-11-07 DIAGNOSIS — M6281 Muscle weakness (generalized): Secondary | ICD-10-CM | POA: Diagnosis not present

## 2019-11-07 DIAGNOSIS — M25512 Pain in left shoulder: Secondary | ICD-10-CM

## 2019-11-07 DIAGNOSIS — R600 Localized edema: Secondary | ICD-10-CM | POA: Diagnosis not present

## 2019-11-07 DIAGNOSIS — G8929 Other chronic pain: Secondary | ICD-10-CM

## 2019-11-07 DIAGNOSIS — M25612 Stiffness of left shoulder, not elsewhere classified: Secondary | ICD-10-CM

## 2019-11-07 NOTE — Therapy (Signed)
[facility] Physical Therapy [address] [place], [place], [address] Phone: [phone]   Fax:  [phone]  Physical Therapy Treatment  Patient Details  Name: Eddie Young MRN: [id number] Date of Birth: Sep 21, 1945 Referring Provider (PT): Durward Fortes   Encounter Date: 11/07/2019   PT End of Session - 11/07/19 0922    Visit Number 20    Number of Visits 24    Date for PT Re-Evaluation 12/11/19    Authorization Type KX REQUIRED    Progress Note Due on Visit 29    PT Start Time 0843    PT Stop Time 0924    PT Time Calculation (min) 41 min    Activity Tolerance Patient tolerated treatment well;No increased pain    Behavior During Therapy WFL for tasks assessed/performed           Past Medical History:  Diagnosis Date  . GERD (gastroesophageal reflux disease)   . Hyperlipidemia   . Macular degeneration   . Thyroid disease    hyper active    Past Surgical History:  Procedure Laterality Date  . ANKLE ARTHROSCOPY WITH REPAIR SUBLUXING TENDON    . KNEE CARTILAGE SURGERY    . SHOULDER ARTHROSCOPY W/ ROTATOR CUFF REPAIR      There were no vitals filed for this visit.   Subjective Assessment - 11/07/19 0847    Subjective Plans to check back with [place] in a few weeks to see how shoulder is progressing.  Main focus at this time is building strength.    Pertinent History status post massive rotator cuff tear repair involving predominately supraspinatus but some portion of the infraspinatus, with biceps tenodesis    Limitations Writing;House hold activities;Lifting    Patient Stated Goals get my shoulder back to normal    Currently in Pain? No/denies                             [facility] Adult PT Treatment/Exercise - 11/07/19 0847      Shoulder Exercises: Standing   External Rotation Left    Theraband Level (Shoulder External Rotation) Level 4 (Blue)    External Rotation Limitations 2X15    Flexion Left    Shoulder Flexion Weight  (lbs) 3    Flexion Limitations 3 x 10    ABduction Left    ABduction Limitations 2# 10 reps, 1# 3x10 reps    Row Both    Theraband Level (Shoulder Row) Level 4 (Blue)    Row Limitations 3x10    Other Standing Exercises bicep curl with overhead press; Lt 2#; 3x10      Shoulder Exercises: ROM/Strengthening   UBE (Upper Arm Bike) Lvl 5 3 mins fwd/back Lt UE only    Ball on Wall abduction and flexion 3x30 sec; 2# wrist weight                  PT Education - 11/07/19 0922    Education Details HEP -strengthening    Person(s) Educated Patient    Methods Explanation    Comprehension Verbalized understanding            PT Short Term Goals - 11/05/19 0930      PT SHORT TERM GOAL #1   Title Pt will be I and compliant with initial HEP.    Time 4    Status Achieved    Target Date 10/16/19      PT SHORT TERM GOAL #2   Title  Pt will improve Lt shoulder PROM flexion >120, abd >100, ER >50    Time 4    Period Weeks    Status Achieved      PT SHORT TERM GOAL #3   Title Pt will improve Lt shoulder AROM flexion to 130 and abd to 100    Time 4    Period Weeks    Status Achieved    Target Date 11/12/19             PT Long Term Goals - 10/29/19 1521      PT LONG TERM GOAL #1   Title Pt will improve Lt shoulder AROM to [facility].    Baseline still lacks abd AROM in standing due to weakness    Time 12    Period Weeks    Status On-going      PT LONG TERM GOAL #2   Title Pt will improve Lt shoulder strength to 4+ overall.    Baseline 3+ for abd, 4- now for flexion    Time 12    Period Weeks    Status On-going      PT LONG TERM GOAL #3   Title Pt will be able to perform usual ADL's and yardwork without complaints in his Lt shoulder.    Status On-going                 Plan - 11/07/19 5053    Clinical Impression Statement Pt continues to do exceptionally well with PT and so transitioned HEP focus to strengthening.  Will plan to see a few more sessions to progress  HEP and strengthening as needed.    Personal Factors and Comorbidities Other   massive tear used patch to repair   Examination-Activity Limitations Carry;Lift;Reach Overhead    Examination-Participation Restrictions Cleaning;Driving;Laundry;Yard Work;Meal Prep    Stability/Clinical Decision Making Stable/Uncomplicated    Rehab Potential Good    PT Frequency 3x / week   2-3   PT Treatment/Interventions ADLs/Self Care Home Management;Cryotherapy;Electrical Stimulation;Moist Heat;Iontophoresis 4mg /ml Dexamethasone;Ultrasound;Neuromuscular re-education;Balance training;Therapeutic exercise;Therapeutic activities;Patient/family education;Manual techniques;Dry needling;Passive range of motion;Taping;Vasopneumatic Device    PT Next Visit Plan Scapular upward rotation mobility, strengthening to reduce shrug.  KX REQUIRED    PT Home Exercise Plan Access Code: [email or web address], added tband rows, ext, IR,ER. Added sidelying abd, standing flexion, abd AROM (he relays he did not need picutres for these)    Consulted and Agree with Plan of Care Patient           Patient will benefit from skilled therapeutic intervention in order to improve the following deficits and impairments:  Decreased activity tolerance, Decreased mobility, Decreased range of motion, Decreased strength, Hypomobility, Increased edema, Impaired flexibility, Increased muscle spasms, Increased fascial restricitons, Impaired UE functional use, Pain  Visit Diagnosis: Acute pain of left shoulder  Muscle weakness (generalized)  Localized edema  Stiffness of left shoulder, not elsewhere classified  Chronic left shoulder pain     Problem List Patient Active Problem List   Diagnosis Date Noted  . Complete tear of left rotator cuff 08/26/2019  . AC (acromioclavicular) arthritis 08/26/2019  . Impingement syndrome of left shoulder 08/26/2019  . Biceps tendonitis on left 08/26/2019  . Right thigh pain 02/13/2019  . Pain in left shoulder  10/28/2018  . Primary osteoarthritis of both knees 01/15/2017      [name], PT, DPT 11/07/19 9:26 AM     [facility] Physical Therapy [address] [place], [place], [address] Phone: [phone]  Fax:  [phone]  Name: JOUD INGWERSEN MRN: [id number] Date of Birth: 1945/08/08

## 2019-11-07 NOTE — Patient Instructions (Signed)
Access Code: [email or web address] URL: [email or web address] Date: 11/07/2019 Prepared by: Faustino Congress  Exercises Shoulder Abduction with Dumbbells - Thumbs Up - 1 x daily - 7 x weekly - 3 sets - 10 reps Single Arm Shoulder Flexion with Dumbbell - 1 x daily - 7 x weekly - 3 sets - 10 reps Shoulder Overhead Press in Flexion with Dumbbells - 1 x daily - 7 x weekly - 3 sets - 10 reps Standing Wall Ball Circles in Scaption with Mini Swiss Ball - 1 x daily - 7 x weekly - 1 sets - 3-5 reps - 30 sec hold Standing Wall [employer] with Mini Swiss Ball - 1 x daily - 7 x weekly - 1 sets - 3-5 reps - 30 sec hold Shoulder External Rotation with Anchored Resistance - 1 x daily - 7 x weekly - 3 sets - 10 reps Standing Row with Anchored Resistance - 1 x daily - 7 x weekly - 3 sets - 10 reps - 5 sec hold

## 2019-11-26 ENCOUNTER — Ambulatory Visit (INDEPENDENT_AMBULATORY_CARE_PROVIDER_SITE_OTHER): Payer: Medicare Other | Admitting: Physical Therapy

## 2019-11-26 ENCOUNTER — Other Ambulatory Visit: Payer: Self-pay

## 2019-11-26 DIAGNOSIS — M6281 Muscle weakness (generalized): Secondary | ICD-10-CM

## 2019-11-26 DIAGNOSIS — M25512 Pain in left shoulder: Secondary | ICD-10-CM | POA: Diagnosis not present

## 2019-11-26 DIAGNOSIS — M25612 Stiffness of left shoulder, not elsewhere classified: Secondary | ICD-10-CM

## 2019-11-26 DIAGNOSIS — R600 Localized edema: Secondary | ICD-10-CM | POA: Diagnosis not present

## 2019-11-26 NOTE — Therapy (Addendum)
[facility] Physical Therapy [address] [place], [place], [address] Phone: [phone]   Fax:  [phone]  Physical Therapy Treatment/Discharge Summary  Patient Details  Name: Eddie Young MRN: [id number] Date of Birth: Aug 15, 1945 Referring Provider (PT): Durward Fortes   Encounter Date: 11/26/2019   PT End of Session - 11/26/19 1109    Visit Number 21    Number of Visits 24    Date for PT Re-Evaluation 12/11/19    Authorization Type KX REQUIRED    Authorization - Visit Number 21    Progress Note Due on Visit 29    PT Start Time 9163    PT Stop Time 1100    PT Time Calculation (min) 45 min    Activity Tolerance Patient tolerated treatment well;No increased pain    Behavior During Therapy WFL for tasks assessed/performed           Past Medical History:  Diagnosis Date  . GERD (gastroesophageal reflux disease)   . Hyperlipidemia   . Macular degeneration   . Thyroid disease    hyper active    Past Surgical History:  Procedure Laterality Date  . ANKLE ARTHROSCOPY WITH REPAIR SUBLUXING TENDON    . KNEE CARTILAGE SURGERY    . SHOULDER ARTHROSCOPY W/ ROTATOR CUFF REPAIR      There were no vitals filed for this visit.   Subjective Assessment - 11/26/19 1108    Subjective relays no pain, he wants to continue to work on HEP as he is doing well and will this, He prefers to come back in 2 weeks for another check up on his strength before he sees MD    Pertinent History status post massive rotator cuff tear repair involving predominately supraspinatus but some portion of the infraspinatus, with biceps tenodesis    Limitations Writing;House hold activities;Lifting    Patient Stated Goals get my shoulder back to normal              [facility] PT Assessment - 11/26/19 0001      Assessment   Medical Diagnosis S/P Lt shoulder massive RTC repair with graft, biceps tenodesis    Referring Provider (PT) [place]    Onset Date/Surgical Date 08/21/19     Hand Dominance Right    Next MD Visit 12/18/19      AROM   Left Shoulder Flexion 160 Degrees    Left Shoulder ABduction 130 Degrees    Left Shoulder Internal Rotation --   [facility]   Left Shoulder External Rotation --   C5 reaching behind head     PROM   Left Shoulder Flexion 175 Degrees    Left Shoulder ABduction 170 Degrees    Left Shoulder External Rotation 66 Degrees      Strength   Left Shoulder Flexion 4/5    Left Shoulder ABduction 4/5    Left Shoulder Internal Rotation 5/5    Left Shoulder External Rotation 5/5                         [facility] Adult PT Treatment/Exercise - 11/26/19 0001      Shoulder Exercises: Prone   Other Prone Exercises prone y, t 2 x 10 each Lt UE      Shoulder Exercises: Standing   Flexion Left    Shoulder Flexion Weight (lbs) 3    Flexion Limitations 3X15    ABduction Left    Shoulder ABduction Weight (lbs) 3    ABduction Limitations 3 sets  of 10      Shoulder Exercises: Pulleys   Flexion 2 minutes    Scaption 2 minutes      Shoulder Exercises: ROM/Strengthening   UBE (Upper Arm Bike) Lvl 5 3 mins fwd/back Lt UE only      Shoulder Exercises: Stretch   Other Shoulder Stretches doorway stretch for ER 10 sec X 150reps    Other Shoulder Stretches supine flexion stretch 1 min X 3 reps with 3 lbs, ER stretch 30 sec  X4                     PT Short Term Goals - 11/05/19 0930      PT SHORT TERM GOAL #1   Title Pt will be I and compliant with initial HEP.    Time 4    Status Achieved    Target Date 10/16/19      PT SHORT TERM GOAL #2   Title Pt will improve Lt shoulder PROM flexion >120, abd >100, ER >50    Time 4    Period Weeks    Status Achieved      PT SHORT TERM GOAL #3   Title Pt will improve Lt shoulder AROM flexion to 130 and abd to 100    Time 4    Period Weeks    Status Achieved    Target Date 11/12/19             PT Long Term Goals - 10/29/19 1521      PT LONG TERM GOAL #1   Title Pt will  improve Lt shoulder AROM to [facility].    Baseline still lacks abd AROM in standing due to weakness    Time 12    Period Weeks    Status On-going      PT LONG TERM GOAL #2   Title Pt will improve Lt shoulder strength to 4+ overall.    Baseline 3+ for abd, 4- now for flexion    Time 12    Period Weeks    Status On-going      PT LONG TERM GOAL #3   Title Pt will be able to perform usual ADL's and yardwork without complaints in his Lt shoulder.    Status On-going                 Plan - 11/26/19 1110    Clinical Impression Statement He has progressed on his own well with ROM and strength but still with deficits in ER ROM, and weakness with flexion and abd. HEP was condensed and revised to focus more on these. He shows good understanding. We will see him back in 2 weeks.    Personal Factors and Comorbidities Other   massive tear used patch to repair   Examination-Activity Limitations Carry;Lift;Reach Overhead    Examination-Participation Restrictions Cleaning;Driving;Laundry;Yard Work;Meal Prep    Stability/Clinical Decision Making Stable/Uncomplicated    Rehab Potential Good    PT Frequency 3x / week   2-3   PT Treatment/Interventions ADLs/Self Care Home Management;Cryotherapy;Electrical Stimulation;Moist Heat;Iontophoresis 4mg /ml Dexamethasone;Ultrasound;Neuromuscular re-education;Balance training;Therapeutic exercise;Therapeutic activities;Patient/family education;Manual techniques;Dry needling;Passive range of motion;Taping;Vasopneumatic Device    PT Next Visit Plan Scapular upward rotation mobility, strengthening to reduce shrug.  KX REQUIRED    PT Home Exercise Plan ER doorway stretch, supine stretching flexion and ER 3 lbs 30 sec  X4, ER with tband, standing flexion and abd with 3 lbs    Consulted and Agree with Plan of Care Patient  Patient will benefit from skilled therapeutic intervention in order to improve the following deficits and impairments:  Decreased  activity tolerance, Decreased mobility, Decreased range of motion, Decreased strength, Hypomobility, Increased edema, Impaired flexibility, Increased muscle spasms, Increased fascial restricitons, Impaired UE functional use, Pain  Visit Diagnosis: Acute pain of left shoulder  Muscle weakness (generalized)  Localized edema  Stiffness of left shoulder, not elsewhere classified     Problem List Patient Active Problem List   Diagnosis Date Noted  . Complete tear of left rotator cuff 08/26/2019  . AC (acromioclavicular) arthritis 08/26/2019  . Impingement syndrome of left shoulder 08/26/2019  . Biceps tendonitis on left 08/26/2019  . Right thigh pain 02/13/2019  . Pain in left shoulder 10/28/2018  . Primary osteoarthritis of both knees 01/15/2017    Debbe Odea 11/26/2019, 11:17 AM  [facility] Physical Therapy [address] [place], [place], [address] Phone: [phone]   Fax:  [phone]  Name: JACAI KIPP MRN: [id number] Date of Birth: 04/24/1945    PHYSICAL THERAPY DISCHARGE SUMMARY  Visits from Start of Care: 21  Current functional level related to goals / functional outcomes: See above   Remaining deficits: See above   Education / Equipment: HEP  Plan: Patient agrees to discharge.  Patient goals were not met. Likely met, but did not return for formal assessment.  Patient is being discharged due to being pleased with the current functional level.  ?????     [name], PT, DPT 01/12/20 11:20 AM  [facility] Physical Therapy [address] [place], [place], [address] Phone: [phone]   Fax:  [phone]

## 2019-11-28 ENCOUNTER — Encounter: Payer: Medicare Other | Admitting: Physical Therapy

## 2019-12-10 ENCOUNTER — Encounter: Payer: Medicare Other | Admitting: Physical Therapy

## 2019-12-18 ENCOUNTER — Ambulatory Visit (INDEPENDENT_AMBULATORY_CARE_PROVIDER_SITE_OTHER): Payer: Medicare Other | Admitting: Orthopaedic Surgery

## 2019-12-18 ENCOUNTER — Other Ambulatory Visit: Payer: Self-pay

## 2019-12-18 ENCOUNTER — Encounter: Payer: Self-pay | Admitting: Orthopaedic Surgery

## 2019-12-18 DIAGNOSIS — M75122 Complete rotator cuff tear or rupture of left shoulder, not specified as traumatic: Secondary | ICD-10-CM

## 2019-12-18 DIAGNOSIS — M25512 Pain in left shoulder: Secondary | ICD-10-CM

## 2019-12-18 DIAGNOSIS — M7542 Impingement syndrome of left shoulder: Secondary | ICD-10-CM

## 2019-12-18 DIAGNOSIS — G8929 Other chronic pain: Secondary | ICD-10-CM | POA: Diagnosis not present

## 2019-12-18 NOTE — Progress Notes (Signed)
Office Visit Note   Patient: Eddie Young           Date of Birth: 1945/06/16           MRN: [id number] Visit Date: 12/18/2019              Requested by: Stephens Shire, MD [address] [place],  [place] [address] PCP: Stephens Shire, MD   Assessment & Plan: Visit Diagnoses:  1. Impingement syndrome of left shoulder   2. Chronic left shoulder pain   3. Complete tear of left rotator cuff, unspecified whether traumatic     Plan: Eddie Young is 5 months status post rotator cuff tear repair, biceps tenodesis and arthroscopic SCD and DCR.  I did use a patch.  He has done exceptionally well and is very happy with the results.  He can get his arm almost fully over his head and relates he does not have any problems with everyday activities.  He does not have any trouble sleeping and does not take any medicines.  He continues to work on his home exercises.  I will plan to see him back as needed  Follow-Up Instructions: Return if symptoms worsen or fail to improve.   Orders:  No orders of the defined types were placed in this encounter.  No orders of the defined types were placed in this encounter.     Procedures: No procedures performed   Clinical Data: No additional findings.   Subjective: Chief Complaint  Patient presents with  . Left Shoulder - Follow-up   5 months status post arthroscopic SCD DCR left shoulder with a mini open rotator cuff tear repair (massive tear) and biceps tenodesis.  I used a patch.  He is finished a course of therapy and has a home exercise program and is done exceptionally well.  Does not have any issues HPI  Review of Systems   Objective: Vital Signs: There were no vitals taken for this visit.  Physical Exam Constitutional:      Appearance: He is well-developed and well-nourished.  HENT:     Mouth/Throat:     Mouth: Oropharynx is clear and moist.  Eyes:     Extraocular Movements: EOM normal.     Pupils: Pupils are  equal, round, and reactive to light.  Pulmonary:     Effort: Pulmonary effort is normal.  Skin:    General: Skin is warm and dry.  Neurological:     Mental Status: He is alert and oriented to person, place, and time.  Psychiatric:        Mood and Affect: Mood and affect normal.        Behavior: Behavior normal.     Ortho Exam awake alert and oriented x3.  Comfortable sitting.  Has a little bit of an adhesive capsulitis left shoulder lacking about 25 to 30 degrees of full overhead motion but not a functional loss.  Has a little loss of external rotation as well but not a functional loss.  No pain with impingement testing negative empty can testing.  Excellent strength with internal and external rotation and abduction.  No localized areas of tenderness.  Good grip and release  Specialty Comments:  No specialty comments available.  Imaging: No results found.   PMFS History: Patient Active Problem List   Diagnosis Date Noted  . Complete tear of left rotator cuff 08/26/2019  . AC (acromioclavicular) arthritis 08/26/2019  . Impingement syndrome of left shoulder  08/26/2019  . Biceps tendonitis on left 08/26/2019  . Right thigh pain 02/13/2019  . Pain in left shoulder 10/28/2018  . Primary osteoarthritis of both knees 01/15/2017   Past Medical History:  Diagnosis Date  . GERD (gastroesophageal reflux disease)   . Hyperlipidemia   . Macular degeneration   . Thyroid disease    hyper active    Family History  Problem Relation Age of Onset  . Thyroid cancer Maternal Grandmother   . Colon cancer Neg Hx     Past Surgical History:  Procedure Laterality Date  . ANKLE ARTHROSCOPY WITH REPAIR SUBLUXING TENDON    . KNEE CARTILAGE SURGERY    . SHOULDER ARTHROSCOPY W/ ROTATOR CUFF REPAIR     Social History   Occupational History  . Not on file  Tobacco Use  . Smoking status: Never Smoker  . Smokeless tobacco: Never Used  Vaping Use  . Vaping Use: Never used  Substance and  Sexual Activity  . Alcohol use: Yes    Alcohol/week: 3.0 standard drinks    Types: 3 Cans of beer per week  . Drug use: No  . Sexual activity: Not on file     Garald Balding, MD   Note - This record has been created using [employer].  Chart creation errors have been sought, but may not always  have been located. Such creation errors do not reflect on  the standard of medical care.

## 2019-12-24 ENCOUNTER — Encounter: Payer: Medicare Other | Admitting: Physical Therapy

## 2020-04-06 ENCOUNTER — Other Ambulatory Visit: Payer: Self-pay | Admitting: Orthopaedic Surgery

## 2020-04-06 DIAGNOSIS — M79651 Pain in right thigh: Secondary | ICD-10-CM

## 2020-10-28 ENCOUNTER — Encounter: Payer: Self-pay | Admitting: Orthopaedic Surgery

## 2020-10-28 ENCOUNTER — Ambulatory Visit: Payer: Self-pay

## 2020-10-28 ENCOUNTER — Other Ambulatory Visit: Payer: Self-pay

## 2020-10-28 ENCOUNTER — Ambulatory Visit: Payer: Medicare HMO | Admitting: Orthopaedic Surgery

## 2020-10-28 DIAGNOSIS — G8929 Other chronic pain: Secondary | ICD-10-CM

## 2020-10-28 DIAGNOSIS — M25562 Pain in left knee: Secondary | ICD-10-CM | POA: Diagnosis not present

## 2020-10-28 MED ORDER — LIDOCAINE HCL 1 % IJ SOLN
2.0000 mL | INTRAMUSCULAR | Status: AC | PRN
Start: 2020-10-28 — End: 2020-10-28
  Administered 2020-10-28: 2 mL

## 2020-10-28 MED ORDER — METHYLPREDNISOLONE ACETATE 40 MG/ML IJ SUSP
80.0000 mg | INTRAMUSCULAR | Status: AC | PRN
  Administered 2020-10-28: 80 mg via INTRA_ARTICULAR

## 2020-10-28 NOTE — Progress Notes (Signed)
Office Visit Note   Patient: Eddie Young           Date of Birth: Jul 08, 1945           MRN: [id number] Visit Date: 10/28/2020              Requested by: Stephens Shire, MD [address] [place],  [place] [address] PCP: Stephens Shire, MD   Assessment & Plan: Visit Diagnoses: No diagnosis found.  Plan: Left knee osteoarthritis with valgus malalignment.  Discussed the natural history of this with the patient today.  Discussed quadricep strengthening.  Also suggested going forward with a cortisone injection today.  He would like to do this.  Ultimately if he did not get good relief could discuss viscosupplementation or knee replacement.  He did have good luck on the right knee a few years ago.  Follow-Up Instructions: No follow-ups on file.   Orders:  No orders of the defined types were placed in this encounter.  No orders of the defined types were placed in this encounter.     Procedures: Large Joint Inj: L knee on 10/28/2020 1:40 PM Indications: pain and diagnostic evaluation Details: 22 G 1.5 in needle, anterolateral approach  Arthrogram: No  Medications: 80 mg methylPREDNISolone acetate 40 MG/ML; 2 mL lidocaine 1 % Outcome: tolerated well, no immediate complications Procedure, treatment alternatives, risks and benefits explained, specific risks discussed. Consent was given by the patient.      Clinical Data: No additional findings.   Subjective: Chief Complaint  Patient presents with   Left Knee - Pain  Patient presents today for left knee pain. He said that it has been hurting for a month. No known injury. He said that it hurts all the time and constantly. Not taking anything for pain. No popping, grinding, or giving way.  He remarks that his grandmother had a history of bad knees.  He did have a right knee injection a few years ago and his right knee is not bothering him. Knee aches all the time but especially with deep flexion of the  knee    Review of Systems  All other systems reviewed and are negative.   Objective: Vital Signs: There were no vitals taken for this visit.  Physical Exam Constitutional:      Appearance: Normal appearance.  HENT:     Head: Normocephalic.  Eyes:     Extraocular Movements: Extraocular movements intact.  Pulmonary:     Effort: Pulmonary effort is normal.     Breath sounds: Normal breath sounds.  Skin:    General: Skin is warm and dry.  Neurological:     General: No focal deficit present.     Mental Status: He is alert.  Psychiatric:        Mood and Affect: Mood normal.        Behavior: Behavior normal.    Ortho Exam Examination of left knee: No effusion, No cellulitis mild soft tissue swelling and warmth. Valgus clinical alignment. Some grinding with range of motion globally achy over all compartments of the left knee. Pain increased with terminal flexion Specialty Comments:  No specialty comments available.  Imaging: No results found.   PMFS History: Patient Active Problem List   Diagnosis Date Noted   Complete tear of left rotator cuff 08/26/2019   AC (acromioclavicular) arthritis 08/26/2019   Impingement syndrome of left shoulder 08/26/2019   Biceps tendonitis on left 08/26/2019   Right thigh pain  02/13/2019   Pain in left shoulder 10/28/2018   Primary osteoarthritis of both knees 01/15/2017   Past Medical History:  Diagnosis Date   GERD (gastroesophageal reflux disease)    Hyperlipidemia    Macular degeneration    Thyroid disease    hyper active    Family History  Problem Relation Age of Onset   Thyroid cancer Maternal Grandmother    Colon cancer Neg Hx     Past Surgical History:  Procedure Laterality Date   ANKLE ARTHROSCOPY WITH REPAIR SUBLUXING TENDON     KNEE CARTILAGE SURGERY     SHOULDER ARTHROSCOPY W/ ROTATOR CUFF REPAIR     Social History   Occupational History   Not on file  Tobacco Use   Smoking status: Never   Smokeless  tobacco: Never  Vaping Use   Vaping Use: Never used  Substance and Sexual Activity   Alcohol use: Yes    Alcohol/week: 3.0 standard drinks    Types: 3 Cans of beer per week   Drug use: No   Sexual activity: Not on file

## 2020-12-01 ENCOUNTER — Ambulatory Visit: Payer: Medicare HMO | Admitting: Cardiology

## 2020-12-10 ENCOUNTER — Encounter: Payer: Self-pay | Admitting: Cardiology

## 2020-12-10 ENCOUNTER — Ambulatory Visit: Payer: Medicare HMO | Admitting: Cardiology

## 2020-12-10 ENCOUNTER — Other Ambulatory Visit: Payer: Self-pay

## 2020-12-10 VITALS — BP 137/69 | HR 63 | Temp 98.0°F | Resp 16 | Ht 71.0 in | Wt 200.0 lb

## 2020-12-10 DIAGNOSIS — E7800 Pure hypercholesterolemia, unspecified: Secondary | ICD-10-CM

## 2020-12-10 DIAGNOSIS — I440 Atrioventricular block, first degree: Secondary | ICD-10-CM

## 2020-12-10 DIAGNOSIS — I491 Atrial premature depolarization: Secondary | ICD-10-CM

## 2020-12-10 NOTE — Progress Notes (Signed)
EKG 12/10/2020: Sinus rhythm with first-degree AV block at rate of 58 bpm, normal axis, no evidence of ischemia, normal EKG.

## 2020-12-10 NOTE — Progress Notes (Signed)
Primary Physician/Referring:  Gwenlyn Found, MD  Patient ID: Eddie Young, male    DOB: 06/30/1945, 75 y.o.   MRN: [id number]  Chief Complaint  Patient presents with   Irregular Heart Beat   1st Degree AV Block   New Patient (Initial Visit)    Referred Grace Bushy   HPI:    Eddie Young  is a 75 y.o. Caucasian male patient with no significant prior cardiovascular history except for mild hyperlipidemia referred to me for evaluation of irregular heartbeat, patient gives blood donation, plasma donation and also during that time she was noted to have slightly irregular heartbeat.  He is asymptomatic with regard to this.  Past Medical History:  Diagnosis Date   GERD (gastroesophageal reflux disease)    Hyperlipidemia    Macular degeneration    Thyroid disease    hyper active   Past Surgical History:  Procedure Laterality Date   ANKLE ARTHROSCOPY WITH REPAIR SUBLUXING TENDON     KNEE CARTILAGE SURGERY     SHOULDER ARTHROSCOPY W/ ROTATOR CUFF REPAIR     Family History  Problem Relation Age of Onset   Heart disease Mother    Thyroid cancer Maternal Grandmother    Colon cancer Neg Hx     Social History   Tobacco Use   Smoking status: Never   Smokeless tobacco: Never  Substance Use Topics   Alcohol use: Yes    Alcohol/week: 3.0 standard drinks    Types: 3 Cans of beer per week    Comment: occ   Marital Status: Married  ROS  Review of Systems  Cardiovascular:  Negative for chest pain, dyspnea on exertion and leg swelling.  Musculoskeletal:  Positive for arthritis (bilateral knee and shoulders).  Gastrointestinal:  Negative for melena.  Objective  Blood pressure 137/69, pulse 63, temperature 98 F (36.7 C), resp. rate 16, height 5\' 11"  (1.803 m), weight 200 lb (90.7 kg), SpO2 98 %. Body mass index is 27.89 kg/m.  Vitals with BMI 12/10/2020 11/06/2019 10/07/2019  Height 5\' 11"  5\' 11"  5\' 11"   Weight 200 lbs 195 lbs 195 lbs  BMI 27.91 27.21 27.21  Systolic  137 - -  Diastolic 69 - -  Pulse 63 - -     Physical Exam Neck:     Vascular: No carotid bruit or JVD.  Cardiovascular:     Rate and Rhythm: Normal rate and regular rhythm.     Pulses: Intact distal pulses.     Heart sounds: Normal heart sounds. No murmur heard.   No gallop.  Pulmonary:     Effort: Pulmonary effort is normal.     Breath sounds: Normal breath sounds.  Abdominal:     General: Bowel sounds are normal.     Palpations: Abdomen is soft.  Musculoskeletal:        General: No swelling.     Laboratory examination:   External labs:   Labs 10/21/2020:  Hb 13.5/HCT 39.2, platelets 325, normal indicis.  Potassium 4.3, BUN 14, creatinine 0.87, EGFR 90 mL, CMP otherwise normal.  TSH normal at 2.09.  Lipid profile 04/02/2020: Total cholesterol 172, triglycerides 90, HDL 53, LDL 97.  Non-HDL cholesterol 119. Medications and allergies   Allergies  Allergen Reactions   Penicillins Other (See Comments)    Unknown;reaction as child per mother     Medication prior to this encounter:   Outpatient Medications Prior to Visit  Medication Sig Dispense Refill   aspirin 325 MG tablet Take 325  mg by mouth daily.     calcium carbonate (OS-CAL) 600 MG TABS tablet Take 600 mg by mouth daily with breakfast.     esomeprazole (NEXIUM) 40 MG capsule Take 40 mg by mouth daily at 12 noon.     GLUCOSAMINE HCL PO Take 2,000 mg by mouth daily.     methimazole (TAPAZOLE) 5 MG tablet Take 5 mg by mouth daily.     Omega-3 Fatty Acids (FISH OIL) 1000 MG CAPS Take 1 capsule by mouth daily.     rosuvastatin (CRESTOR) 20 MG tablet Take 20 mg by mouth daily.     vitamin E 180 MG (400 UNITS) capsule Take 400 Units by mouth daily.     Multiple Vitamins-Minerals (ICAPS) TABS Take by mouth.     Saw Palmetto 450 MG CAPS Take 1 capsule by mouth daily.     No facility-administered medications prior to visit.     Medication list after today's encounter   Current Outpatient Medications   Medication Instructions   aspirin 325 mg, Daily   calcium carbonate (OS-CAL) 600 mg, Daily with breakfast   esomeprazole (NEXIUM) 40 mg, Daily   GLUCOSAMINE HCL PO 2,000 mg, Daily   methimazole (TAPAZOLE) 5 mg, Daily   Omega-3 Fatty Acids (FISH OIL) 1000 MG CAPS 1 capsule, Daily   rosuvastatin (CRESTOR) 20 mg, Oral, Daily   vitamin E 400 Units, Daily    Radiology:   No results found.  Cardiac Studies:    EKG:   EKG 12/10/2020: Sinus rhythm with first-degree AV block at rate of 58 bpm, normal axis, no evidence of ischemia, normal EKG.    Assessment     ICD-10-CM   1. 1st degree AV block  I44.0 EKG 12-Lead    2. PAC (premature atrial contraction)  I49.1     3. Pure hypercholesterolemia  E78.00        Medications Discontinued During This Encounter  Medication Reason   Multiple Vitamins-Minerals (ICAPS) TABS    Saw Palmetto 450 MG CAPS     No orders of the defined types were placed in this encounter.  Orders Placed This Encounter  Procedures   EKG 12-Lead   Recommendations:   Eddie Young is a 75 y.o. Caucasian male patient with no significant prior cardiovascular history except for mild hyperlipidemia referred to me for evaluation of irregular heartbeat, patient gives blood donation, plasma donation and also during that time she was noted to have slightly irregular heartbeat.  He is asymptomatic with regard to this.  His physical examination is completely normal.  His EKG reveals underlying first-degree AV block.  He is fairly active, does all his yard work and does physical activity and walking fairly regularly without any chest pain or dyspnea.  As there is no clinical evidence of any vascular disease, he has no other significant risk factors except for elderly age and also mild hyperlipidemia, no further evaluation is indicated in an asymptomatic individual.  With regard to first-degree AV block, again there is no decreased exercise tolerance, no dizziness or  syncope, hence continued observation is indicated.  I will see him back on a as needed basis.  I reviewed his external labs.  Lipids are also well controlled.  I have reassured him.  I also discussed with him that prediction of coronary events especially acute coronary events and or vascular events is also unpredictable and only we can do is primary prevention.  He will continue to remain active and continue to exercise regularly.  In the absence of symptoms, I have not ordered any tests especially when these risk factors are well controlled.   Adrian Prows, MD, [facility] 12/10/2020, 9:29 AM Office: [phone]

## 2020-12-16 IMAGING — MR MR SHOULDER*L* W/O CM
4 of 5 series · 21 of 40 positions shown · non-contrast
Comparison: None.

CLINICAL DATA: Left shoulder pain for several months. No specific
injury.

EXAM:
MRI OF THE LEFT SHOULDER WITHOUT CONTRAST
TECHNIQUE: Multiplanar, multisequence MR imaging of the shoulder was performed.
No intravenous contrast was administered.

[Series 6: PD fat-sat · axial · left · 4.0mm · 0.44mm/px · z∈[-52,+53]mm · 8 of 23 slices shown (1 of 2)]
[im 1/23]
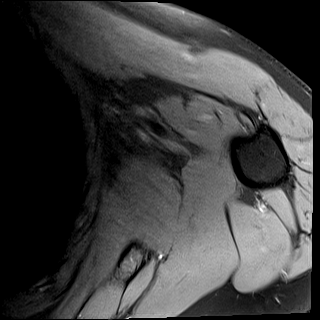
[im 4/23]
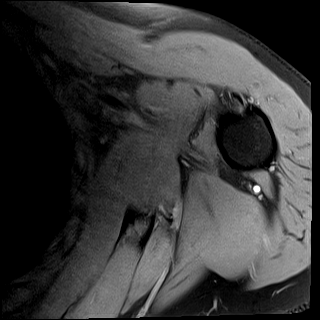
[im 7/23]
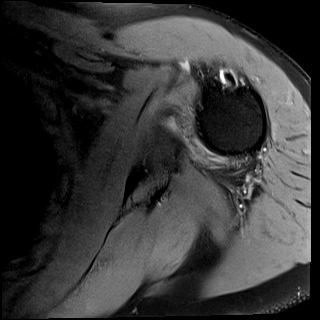
[im 10/23]
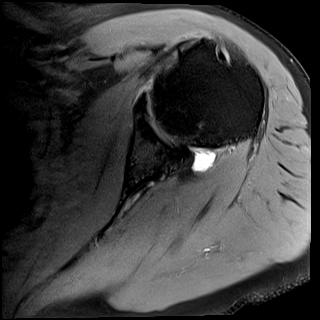
[im 13/23]
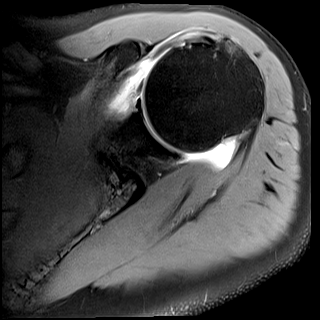
[im 16/23]
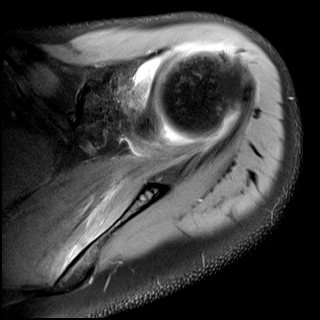
[im 19/23]
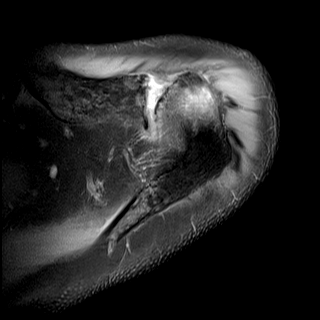
[im 23/23]
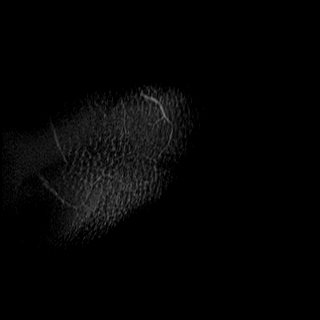

[Series 7: T2 fat-sat · oblique · left · 4.0mm · 0.23mm/px · 3 of 21 slices shown (1 of 2)]
[im 3/21]
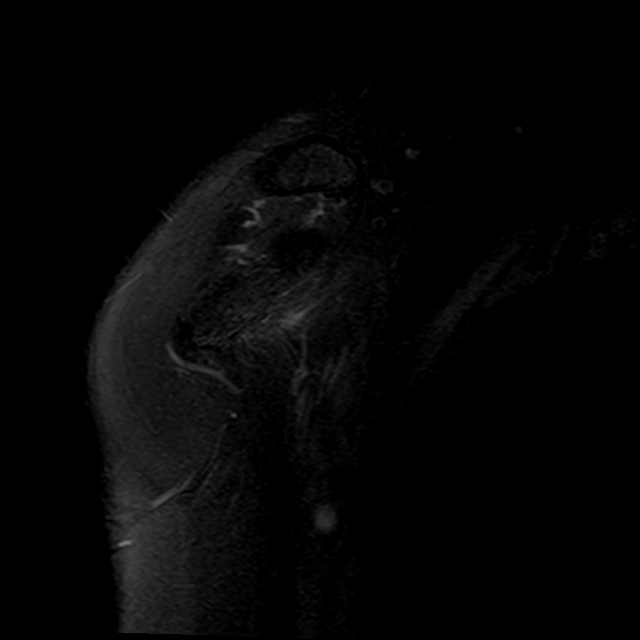
[im 12/21]
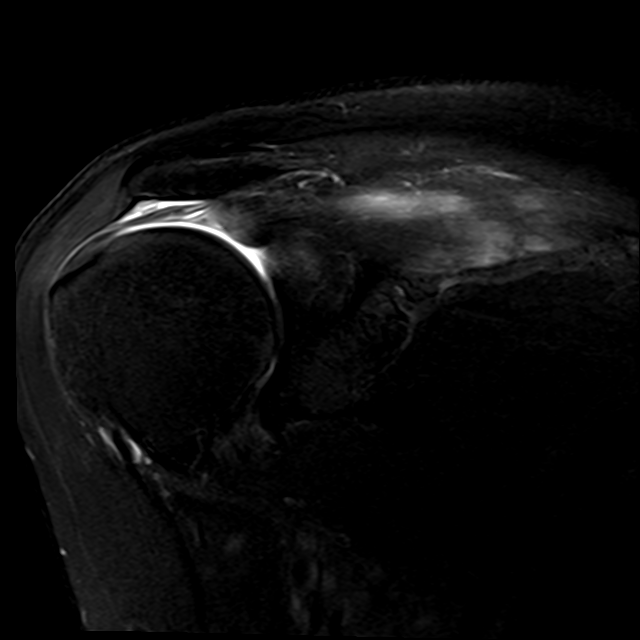
[im 18/21]
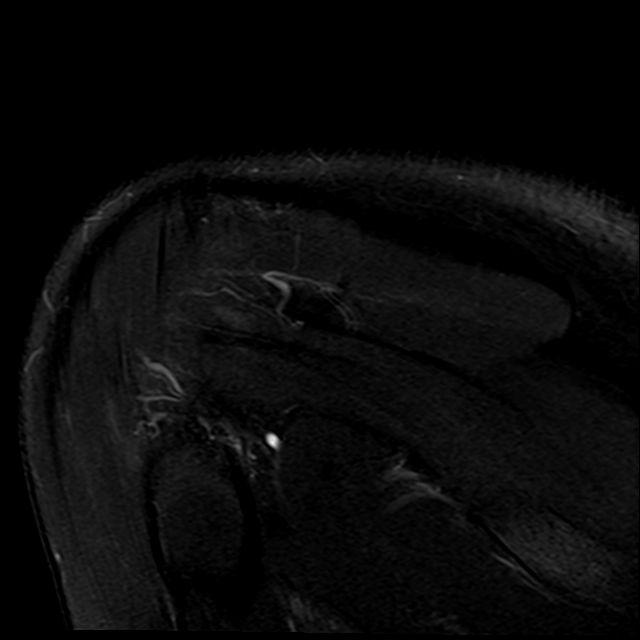

[Series 8: PD fat-sat · oblique · left · 4.0mm · 0.23mm/px · 7 of 21 slices shown (2 of 2)]
[im 1/21]
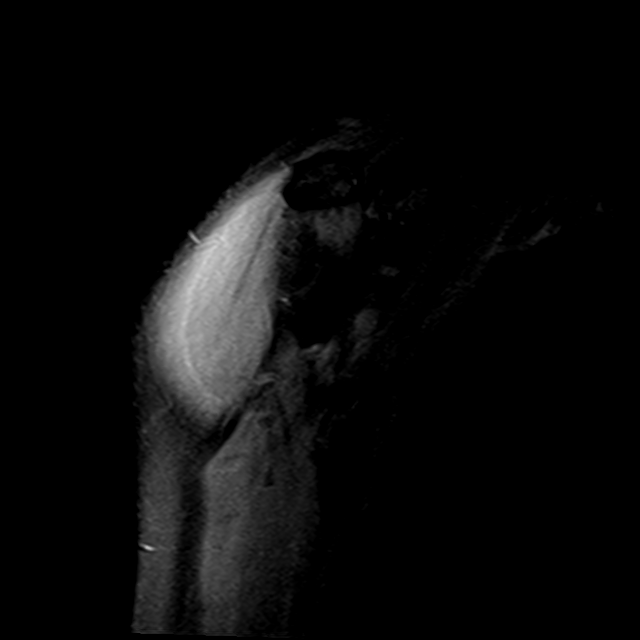
[im 3/21]
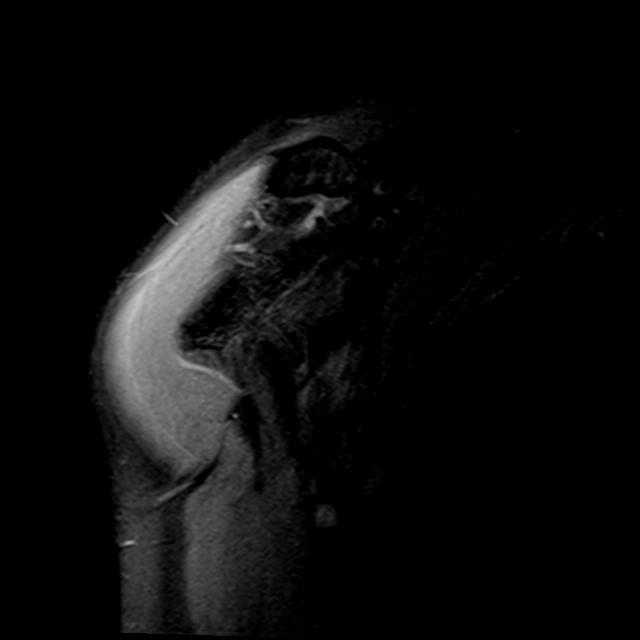
[im 6/21]
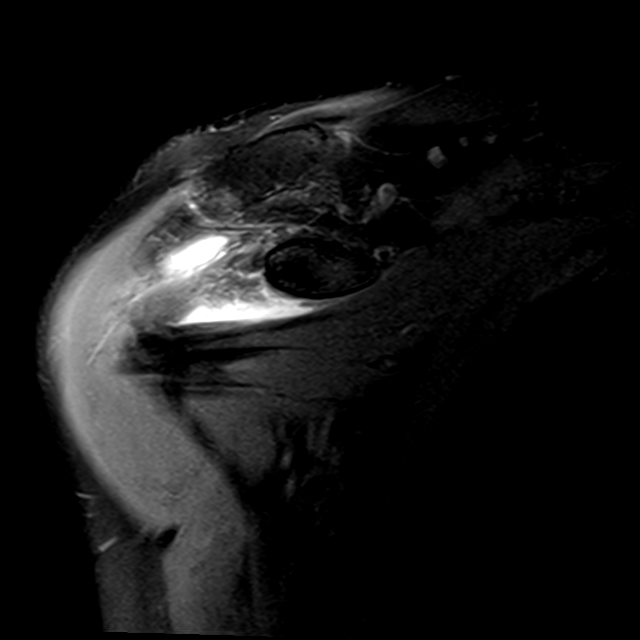
[im 9/21]
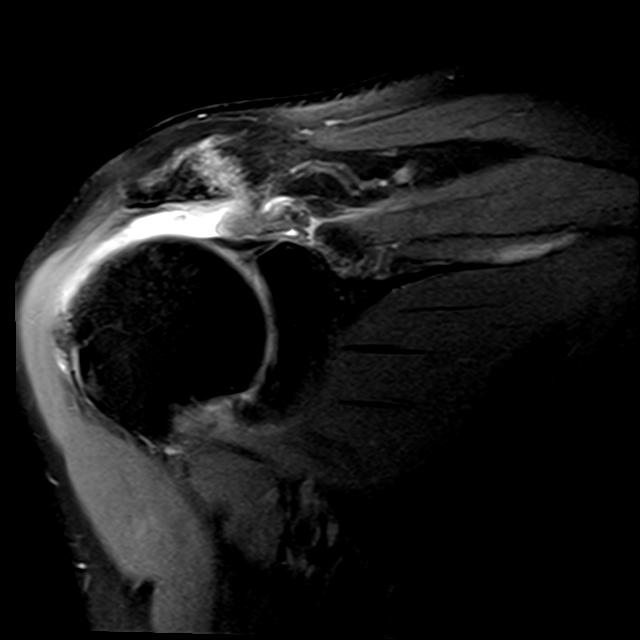
[im 12/21]
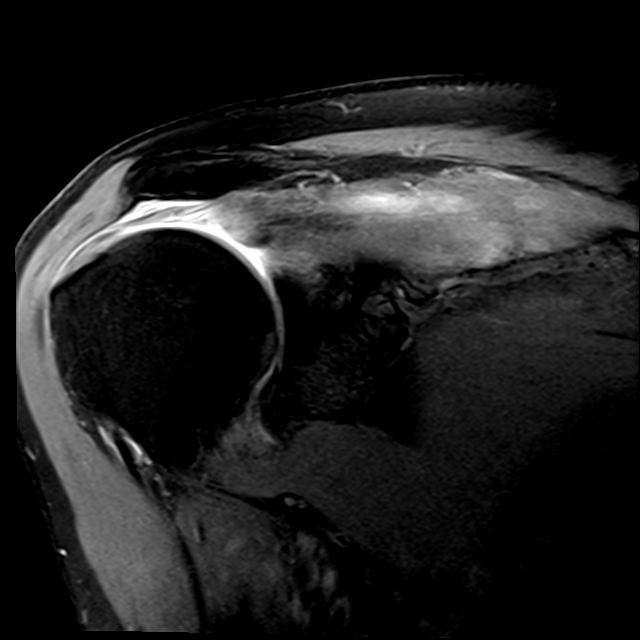
[im 15/21]
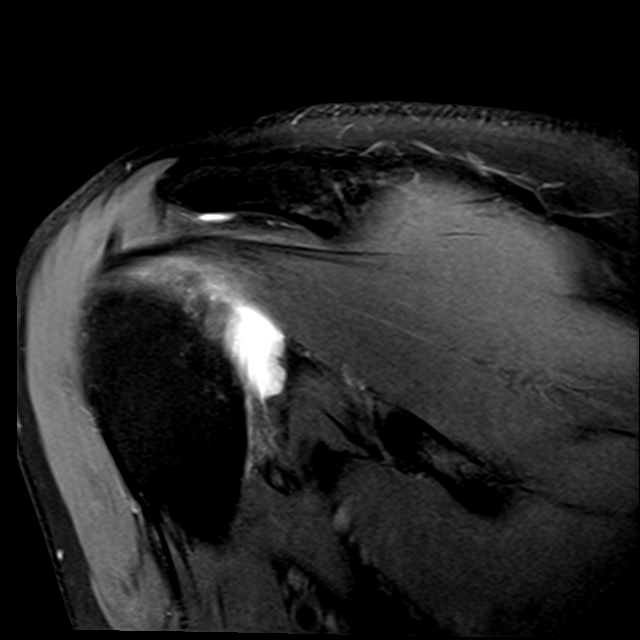
[im 18/21]
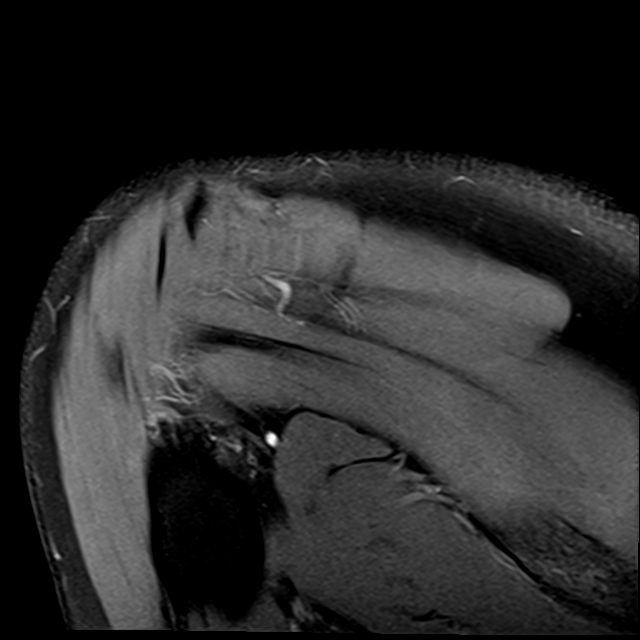

[Series 9: T2 fat-sat · oblique · left · 4.0mm · 0.44mm/px · 3 of 23 slices shown (2 of 2)]
[im 4/23]
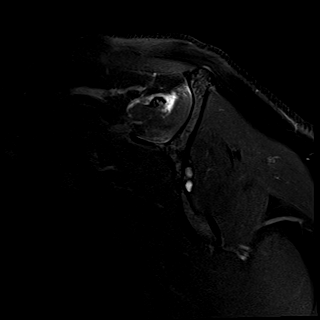
[im 13/23]
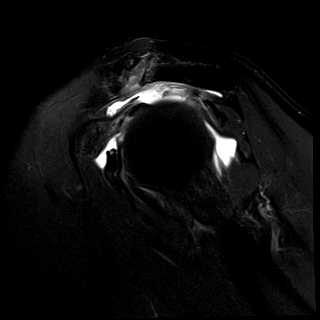
[im 19/23]
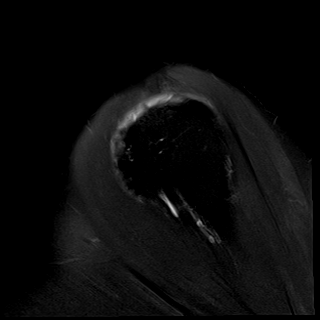

[21 of 40 positions shown; findings below may reference images not displayed]

FINDINGS: Rotator cuff: Large full-thickness retracted supraspinatus tendon
tear. Maximum retraction is 4 cm. The infraspinatus and
subscapularis tendons are intact. Moderate tendinopathy involving
the upper fibers of the subscapularis tendon.

Muscles: Mild to moderate fatty atrophy of the supraspinatus muscle.

Biceps long head:  Intact

Acromioclavicular Joint: Minimal degenerative changes. Type 2
acromion. No lateral downsloping or undersurface spurring.

Glenohumeral Joint: Mild degenerative changes. Small joint effusion.

Labrum:  No labral tears are identified.

Bones:  No acute bony findings.

Other: Expected fluid in the subacromial/subdeltoid bursa.
IMPRESSION: 1. Large full-thickness retracted supraspinatus tendon tear.
Associated mild/moderate fatty atrophy of the supraspinatus muscle.
2. The infraspinatus and subscapularis tendons are intact.
3. Intact long head biceps tendon and glenoid labrum.
4. No significant findings for bony impingement.

## 2021-03-24 ENCOUNTER — Ambulatory Visit: Payer: Medicare HMO | Admitting: Orthopaedic Surgery

## 2021-03-24 ENCOUNTER — Other Ambulatory Visit: Payer: Self-pay

## 2021-03-24 ENCOUNTER — Encounter: Payer: Self-pay | Admitting: Orthopaedic Surgery

## 2021-03-24 VITALS — Ht 71.0 in | Wt 200.0 lb

## 2021-03-24 DIAGNOSIS — M25562 Pain in left knee: Secondary | ICD-10-CM | POA: Diagnosis not present

## 2021-03-24 DIAGNOSIS — G8929 Other chronic pain: Secondary | ICD-10-CM | POA: Diagnosis not present

## 2021-03-24 NOTE — Progress Notes (Signed)
? ?Office Visit Note ?  ?Patient: Eddie Young           ?Date of Birth: [birth year]           ?MRN: [id number] ?Visit Date: 03/24/2021 ?             ?Requested by: Nickola Major, MD ?[address] [place],  [place] [address] ?PCP: Nickola Major, MD ? ? ?Assessment & Plan: ?Visit Diagnoses: No diagnosis found. ? ?Plan: Pleasant 76 year old gentleman with a history of left greater than right knee arthritis.  He has valgus arthritis of the left knee.  He has gotten some relief with cortisone injections.  He says his knee was hurting him quite a bit yesterday especially after sitting watching a performance in a tight seat.  Now feels better today.  He feels that he is ready to have knee replacement.  He will be going to [place] for 3 months starting in September.  Wondering if he should do it prior or after.  After discussion he would favor having the knee replacement once he returns.  He may come in just prior to his departure and get another steroid injection.  He understands he cannot have a steroid injection 2 months prior to knee replacement.  Dr. Durward Fortes discovered the risks of surgery including bleeding infection anesthesia complications discussed recovery.  Also stressed the importance of doing preoperative lower extremity strengthening ? ?Follow-Up Instructions: No follow-ups on file.  ? ?Orders:  ?No orders of the defined types were placed in this encounter. ? ?No orders of the defined types were placed in this encounter. ? ? ? ? Procedures: ?No procedures performed ? ? ?Clinical Data: ?No additional findings. ? ? ?Subjective: ?Chief Complaint  ?Patient presents with  ? Left Knee - Pain  ?Patient presents today for recurrent chronic left knee pain. He states that the injection last October did help, but does not want anymore. He wants to talk about total knee arthroplasty. He is not taking anything for pain.  ? ? ? ?Review of Systems  ?All other systems reviewed and are  negative. ? ? ?Objective: ?Vital Signs: There were no vitals taken for this visit. ? ?Physical Exam ?Patient appears well sitting comfortably in a chair ?Ortho Exam ?Left knee no effusion mild warmth he is able to sustain a straight leg raise.  Can get to full extension flexes to 105 degrees.  Valgus alignment when weightbearing ?Specialty Comments:  ?No specialty comments available. ? ?Imaging: ?No results found. ? ? ?PMFS History: ?Patient Active Problem List  ? Diagnosis Date Noted  ? Complete tear of left rotator cuff 08/26/2019  ? AC (acromioclavicular) arthritis 08/26/2019  ? Impingement syndrome of left shoulder 08/26/2019  ? Biceps tendonitis on left 08/26/2019  ? Right thigh pain 02/13/2019  ? Pain in left shoulder 10/28/2018  ? Primary osteoarthritis of both knees 01/15/2017  ? ?Past Medical History:  ?Diagnosis Date  ? GERD (gastroesophageal reflux disease)   ? Hyperlipidemia   ? Macular degeneration   ? Thyroid disease   ? hyper active  ?  ?Family History  ?Problem Relation Age of Onset  ? Heart disease Mother   ? Thyroid cancer Maternal Grandmother   ? Colon cancer Neg Hx   ?  ?Past Surgical History:  ?Procedure Laterality Date  ? ANKLE ARTHROSCOPY WITH REPAIR SUBLUXING TENDON    ? KNEE CARTILAGE SURGERY    ? SHOULDER ARTHROSCOPY W/ ROTATOR CUFF REPAIR    ? ?Social  History  ? ?Occupational History  ? Not on file  ?Tobacco Use  ? Smoking status: Never  ? Smokeless tobacco: Never  ?Vaping Use  ? Vaping Use: Never used  ?Substance and Sexual Activity  ? Alcohol use: Yes  ?  Alcohol/week: 3.0 standard drinks  ?  Types: 3 Cans of beer per week  ?  Comment: occ  ? Drug use: No  ? Sexual activity: Not on file  ? ? ? ? ? ? ?

## 2021-09-13 ENCOUNTER — Encounter: Payer: Self-pay | Admitting: Orthopaedic Surgery

## 2021-09-13 ENCOUNTER — Ambulatory Visit: Payer: Medicare HMO | Admitting: Orthopaedic Surgery

## 2021-09-13 DIAGNOSIS — M1712 Unilateral primary osteoarthritis, left knee: Secondary | ICD-10-CM | POA: Insufficient documentation

## 2021-09-13 MED ORDER — LIDOCAINE HCL 1 % IJ SOLN
2.0000 mL | INTRAMUSCULAR | Status: AC | PRN
  Administered 2021-09-13: 2 mL

## 2021-09-13 MED ORDER — METHYLPREDNISOLONE ACETATE 40 MG/ML IJ SUSP
80.0000 mg | INTRAMUSCULAR | Status: AC | PRN
  Administered 2021-09-13: 80 mg via INTRA_ARTICULAR

## 2021-09-13 MED ORDER — BUPIVACAINE HCL 0.25 % IJ SOLN
2.0000 mL | INTRAMUSCULAR | Status: AC | PRN
  Administered 2021-09-13: 2 mL via INTRA_ARTICULAR

## 2021-09-13 NOTE — Progress Notes (Signed)
Office Visit Note   Patient: Eddie Young           Date of Birth: 06-Jan-1946           MRN: [id number] Visit Date: 09/13/2021              Requested by: Nickola Major, MD [address] [place],  [place] [address] PCP: Nickola Major, MD   Assessment & Plan: Visit Diagnoses:  1. Arthritis of left knee     Plan: Patient is a pleasant 76 year old gentleman with a history of left valgus arthritis.  He has gotten some relief with injections went forward with a cortisone injection today.  He is traveling to [place] but is going to consider left knee replacement when he returns.  Have recommended Dr. Ninfa Linden for this.  Follow-Up Instructions: Discuss left knee replacement with Dr. Ninfa Linden  Orders:  Orders Placed This Encounter  Procedures   Large Joint Inj: L knee   No orders of the defined types were placed in this encounter.     Procedures: Large Joint Inj: L knee on 09/13/2021 2:01 PM Indications: pain and diagnostic evaluation Details: 25 G 1.5 in needle, anterolateral approach  Arthrogram: No  Medications: 80 mg methylPREDNISolone acetate 40 MG/ML; 2 mL lidocaine 1 %; 2 mL bupivacaine 0.25 % Outcome: tolerated well, no immediate complications Procedure, treatment alternatives, risks and benefits explained, specific risks discussed. Consent was given by the patient.      Clinical Data: No additional findings.   Subjective: Chief Complaint  Patient presents with   Left Knee - Follow-up   Patient presents today for follow up of his left knee pain. Patient states that last injectio received on 10/28/2020 has helped with his pain until currently. Today patient is requesting repeat injection in his left knee.   Review of Systems  All other systems reviewed and are negative.    Objective: Vital Signs: There were no vitals taken for this visit.  Physical Exam Constitutional:      Appearance: Normal appearance.  Pulmonary:     Effort:  Pulmonary effort is normal.  Skin:    General: Skin is warm and dry.  Neurological:     Mental Status: He is alert.     Ortho Exam Left knee no effusion mild soft tissue swelling no redness.  He has some clinically tenderness over the lateral joint line more than the medial joint line.  Compartments of the lower leg are soft and nontender.  Increased valgus with weightbearing.  Full extension.  No effusion.  Flexed about 105 degrees.  No popliteal pain or mass Specialty Comments:  No specialty comments available.  Imaging: No results found.   PMFS History: Patient Active Problem List   Diagnosis Date Noted   Arthritis of left knee 09/13/2021   AC (acromioclavicular) arthritis 08/26/2019   Impingement syndrome of left shoulder 08/26/2019   Biceps tendonitis on left 08/26/2019   Pain in left shoulder 10/28/2018   Past Medical History:  Diagnosis Date   GERD (gastroesophageal reflux disease)    Hyperlipidemia    Macular degeneration    Thyroid disease    hyper active    Family History  Problem Relation Age of Onset   Heart disease Mother    Thyroid cancer Maternal Grandmother    Colon cancer Neg Hx     Past Surgical History:  Procedure Laterality Date   ANKLE ARTHROSCOPY WITH REPAIR SUBLUXING TENDON     KNEE CARTILAGE  SURGERY     SHOULDER ARTHROSCOPY W/ ROTATOR CUFF REPAIR     Social History   Occupational History   Not on file  Tobacco Use   Smoking status: Never   Smokeless tobacco: Never  Vaping Use   Vaping Use: Never used  Substance and Sexual Activity   Alcohol use: Yes    Alcohol/week: 3.0 standard drinks of alcohol    Types: 3 Cans of beer per week    Comment: occ   Drug use: No   Sexual activity: Not on file

## 2022-06-21 ENCOUNTER — Encounter: Payer: Self-pay | Admitting: Orthopaedic Surgery

## 2022-06-21 ENCOUNTER — Ambulatory Visit (INDEPENDENT_AMBULATORY_CARE_PROVIDER_SITE_OTHER): Payer: Medicare HMO | Admitting: Orthopaedic Surgery

## 2022-06-21 DIAGNOSIS — G8929 Other chronic pain: Secondary | ICD-10-CM

## 2022-06-21 DIAGNOSIS — M1712 Unilateral primary osteoarthritis, left knee: Secondary | ICD-10-CM

## 2022-06-21 DIAGNOSIS — M1711 Unilateral primary osteoarthritis, right knee: Secondary | ICD-10-CM | POA: Diagnosis not present

## 2022-06-21 DIAGNOSIS — M25562 Pain in left knee: Secondary | ICD-10-CM

## 2022-06-21 NOTE — Progress Notes (Signed)
The patient is someone I am seeing for the first time.  However he has a long established patient of Dr. Cleophas Dunker.  He is 77 years old and will be 77 next week.  He has significant valgus malalignment of both his knees.  His last x-rays were in 2022 showing tricompartment arthritis that are bone-on-bone wear.  He has tried and failed all forms conservative treatment.  He is not a diabetic and not on blood thinning medications.  I was able to review all of his notes within epic in terms of medications and past medical history.  At this point he has failed conservative treatment for well over several years now.  His knee pain is daily and is detrimentally affecting his mobility, his quality of life and his actives daily living.  It is detrimentally affecting his posture as well with both knees having significant valgus malalignment when he walks.  Both knees have an effusion.  Both knees have significant valgus malalignment.  Both knees are ligamentously stable otherwise with significant patellofemoral crepitation and global pain around both knees.  I did go over his previous x-rays with him of both knees showing severe end-stage arthritis with valgus malalignment and bone-on-bone wear of all 3 compartments including osteophytes in all 3 compartments.  We had a long and thorough discussion about knee replacement surgery.  I showed him a knee replacement model and we discussed the risks and benefits of the surgery and what to expect from an intraoperative and postoperative course.  All question concerns were answered and addressed.  He would like to be scheduled for a left total knee arthroplasty sometime after August 15.  He knows we will be in touch.

## 2022-08-04 ENCOUNTER — Telehealth: Payer: Self-pay | Admitting: Orthopaedic Surgery

## 2022-08-04 NOTE — Telephone Encounter (Signed)
Left below message on VM for patient

## 2022-08-04 NOTE — Telephone Encounter (Signed)
Pt called in stating he would like to know if there are any exercises he can do prior to surgery to help with his PT after surgery next month, he stated the information can be uploaded on mychart

## 2022-08-21 ENCOUNTER — Other Ambulatory Visit: Payer: Self-pay

## 2022-08-21 NOTE — Patient Instructions (Signed)
SURGICAL WAITING ROOM VISITATION  Patients having surgery or a procedure may have no more than 2 support people in the waiting area - these visitors may rotate.    Children under the age of 65 must have an adult with them who is not the patient.  Due to an increase in RSV and influenza rates and associated hospitalizations, children ages 69 and under may not visit patients in [facility] hospitals.  If the patient needs to stay at the hospital during part of their recovery, the visitor guidelines for inpatient rooms apply. Pre-op nurse will coordinate an appropriate time for 1 support person to accompany patient in pre-op.  This support person may not rotate.    Please refer to the [facility] website for the visitor guidelines for Inpatients (after your surgery is over and you are in a regular room).       Your procedure is scheduled on:  09/01/22    Report to [facility] Main Entrance    Report to admitting at    0600AM   Call this number if you have problems the morning of surgery [phone]   Do not eat food :After Midnight.   After Midnight you may have the following liquids until __ 0530____ AM OF SURGERY  Water Non-Citrus Juices (without pulp, NO RED-Apple, White grape, White cranberry) Black Coffee (NO MILK/CREAM OR CREAMERS, sugar ok)  Clear Tea (NO MILK/CREAM OR CREAMERS, sugar ok) regular and decaf                             Plain Jell-O (NO RED)                                           Fruit ices (not with fruit pulp, NO RED)                                     Popsicles (NO RED)                                                               Sports drinks like Gatorade (NO RED)                    The day of surgery:  Drink ONE (1) Pre-Surgery Clear Ensure or G2 at   0530AM  ( have completed by ) the morning of surgery. Drink in one sitting. Do not sip.  This drink was given to you during your hospital  pre-op appointment visit. Nothing else to drink  after completing the  Pre-Surgery Clear Ensure or G2.          If you have questions, please contact your surgeon's office.        Oral Hygiene is also important to reduce your risk of infection.                                    Remember - BRUSH YOUR TEETH THE MORNING OF SURGERY  WITH YOUR REGULAR TOOTHPASTE  DENTURES WILL BE REMOVED PRIOR TO SURGERY PLEASE DO NOT APPLY "Poly grip" OR ADHESIVES!!!   Do NOT smoke after Midnight   Stop all vitamins and herbal supplements 7 days before surgery.   Take these medicines the morning of surgery with A SIP OF WATER:  mexium, tapazole   DO NOT TAKE ANY ORAL DIABETIC MEDICATIONS DAY OF YOUR SURGERY  Bring CPAP mask and tubing day of surgery.                              You may not have any metal on your body including hair pins, jewelry, and body piercing             Do not wear make-up, lotions, powders, perfumes/cologne, or deodorant  Do not wear nail polish including gel and S&S, artificial/acrylic nails, or any other type of covering on natural nails including finger and toenails. If you have artificial nails, gel coating, etc. that needs to be removed by a nail salon please have this removed prior to surgery or surgery may need to be canceled/ delayed if the surgeon/ anesthesia feels like they are unable to be safely monitored.   Do not shave  48 hours prior to surgery.               Men may shave face and neck.   Do not bring valuables to the hospital. [facility] IS NOT             RESPONSIBLE   FOR VALUABLES.   Contacts, glasses, dentures or bridgework may not be worn into surgery.   Bring small overnight bag day of surgery.   DO NOT BRING YOUR HOME MEDICATIONS TO THE HOSPITAL. PHARMACY WILL DISPENSE MEDICATIONS LISTED ON YOUR MEDICATION LIST TO YOU DURING YOUR ADMISSION IN THE HOSPITAL!    Patients discharged on the day of surgery will not be allowed to drive home.  Someone NEEDS to stay with you for the first 24 hours  after anesthesia.   Special Instructions: Bring a copy of your healthcare power of attorney and living will documents the day of surgery if you haven't scanned them before.              Please read over the following fact sheets you were given: IF YOU HAVE QUESTIONS ABOUT YOUR PRE-OP INSTRUCTIONS PLEASE CALL [phone]   If you received a COVID test during your pre-op visit  it is requested that you wear a mask when out in public, stay away from anyone that may not be feeling well and notify your surgeon if you develop symptoms. If you test positive for Covid or have been in contact with anyone that has tested positive in the last 10 days please notify you surgeon.      Pre-operative 5 CHG Bath Instructions   You can play a key role in reducing the risk of infection after surgery. Your skin needs to be as free of germs as possible. You can reduce the number of germs on your skin by washing with CHG (chlorhexidine gluconate) soap before surgery. CHG is an antiseptic soap that kills germs and continues to kill germs even after washing.   DO NOT use if you have an allergy to chlorhexidine/CHG or antibacterial soaps. If your skin becomes reddened or irritated, stop using the CHG and notify one of our RNs at [phone].   Please shower with the CHG  soap starting 4 days before surgery using the following schedule:     Please keep in mind the following:  DO NOT shave, including legs and underarms, starting the day of your first shower.   You may shave your face at any point before/day of surgery.  Place clean sheets on your bed the day you start using CHG soap. Use a clean washcloth (not used since being washed) for each shower. DO NOT sleep with pets once you start using the CHG.   CHG Shower Instructions:  If you choose to wash your hair and private area, wash first with your normal shampoo/soap.  After you use shampoo/soap, rinse your hair and body thoroughly to remove shampoo/soap  residue.  Turn the water OFF and apply about 3 tablespoons (45 ml) of CHG soap to a CLEAN washcloth.  Apply CHG soap ONLY FROM YOUR NECK DOWN TO YOUR TOES (washing for 3-5 minutes)  DO NOT use CHG soap on face, private areas, open wounds, or sores.  Pay special attention to the area where your surgery is being performed.  If you are having back surgery, having someone wash your back for you may be helpful. Wait 2 minutes after CHG soap is applied, then you may rinse off the CHG soap.  Pat dry with a clean towel  Put on clean clothes/pajamas   If you choose to wear lotion, please use ONLY the CHG-compatible lotions on the back of this paper.     Additional instructions for the day of surgery: DO NOT APPLY any lotions, deodorants, cologne, or perfumes.   Put on clean/comfortable clothes.  Brush your teeth.  Ask your nurse before applying any prescription medications to the skin.      CHG Compatible Lotions   Aveeno Moisturizing lotion  Cetaphil Moisturizing Cream  Cetaphil Moisturizing Lotion  Clairol Herbal Essence Moisturizing Lotion, Dry Skin  Clairol Herbal Essence Moisturizing Lotion, Extra Dry Skin  Clairol Herbal Essence Moisturizing Lotion, Normal Skin  Curel Age Defying Therapeutic Moisturizing Lotion with Alpha Hydroxy  Curel Extreme Care Body Lotion  Curel Soothing Hands Moisturizing Hand Lotion  Curel Therapeutic Moisturizing Cream, Fragrance-Free  Curel Therapeutic Moisturizing Lotion, Fragrance-Free  Curel Therapeutic Moisturizing Lotion, Original Formula  Eucerin Daily Replenishing Lotion  Eucerin Dry Skin Therapy Plus Alpha Hydroxy Crme  Eucerin Dry Skin Therapy Plus Alpha Hydroxy Lotion  Eucerin Original Crme  Eucerin Original Lotion  Eucerin Plus Crme Eucerin Plus Lotion  Eucerin TriLipid Replenishing Lotion  Keri Anti-Bacterial Hand Lotion  Keri Deep Conditioning Original Lotion Dry Skin Formula Softly Scented  Keri Deep Conditioning Original Lotion,  Fragrance Free Sensitive Skin Formula  Keri Lotion Fast Absorbing Fragrance Free Sensitive Skin Formula  Keri Lotion Fast Absorbing Softly Scented Dry Skin Formula  Keri Original Lotion  Keri Skin Renewal Lotion Keri Silky Smooth Lotion  Keri Silky Smooth Sensitive Skin Lotion  Nivea Body Creamy Conditioning Oil  Nivea Body Extra Enriched Teacher, adult education Moisturizing Lotion Nivea Crme  Nivea Skin Firming Lotion  NutraDerm 30 Skin Lotion  NutraDerm Skin Lotion  NutraDerm Therapeutic Skin Cream  NutraDerm Therapeutic Skin Lotion  ProShield Protective Hand Cream  Provon moisturizing lotion

## 2022-08-21 NOTE — Progress Notes (Signed)
Anesthesia Review:  PCP: Brett Fairy L[facility] 06/01/22 Going to see for regular check up on 08/30/22 per pt  Cardiologist :  Dr Jacinto Halim [facility] 12/10/20  Chest x-ray : EKG :08/23/22  Echo : Stress test: Cardiac Cath :  Activity level: can do a flight of stairs without difficutly  Sleep Study/ CPAP : none  Fasting Blood Sugar :      / Checks Blood Sugar -- times a day:   Blood Thinner/ Instructions /Last Dose: ASA / Instructions/ Last Dose :    81 mg  aspirin   PT wears Hearing aids

## 2022-08-23 ENCOUNTER — Encounter (HOSPITAL_COMMUNITY): Payer: Self-pay

## 2022-08-23 ENCOUNTER — Other Ambulatory Visit: Payer: Self-pay

## 2022-08-23 ENCOUNTER — Encounter (HOSPITAL_COMMUNITY)
Admission: RE | Admit: 2022-08-23 | Discharge: 2022-08-23 | Disposition: A | Discharge status: Home / Self Care | Payer: Medicare HMO | Source: Ambulatory Visit | Attending: Orthopaedic Surgery | Admitting: Orthopaedic Surgery

## 2022-08-23 VITALS — BP 138/77 | HR 51 | Temp 98.0°F | Resp 16 | Ht 71.0 in | Wt 201.0 lb

## 2022-08-23 DIAGNOSIS — Z01818 Encounter for other preprocedural examination: Secondary | ICD-10-CM | POA: Diagnosis present

## 2022-08-23 DIAGNOSIS — Z01810 Encounter for preprocedural cardiovascular examination: Secondary | ICD-10-CM | POA: Diagnosis not present

## 2022-08-23 DIAGNOSIS — M1712 Unilateral primary osteoarthritis, left knee: Secondary | ICD-10-CM | POA: Insufficient documentation

## 2022-08-23 DIAGNOSIS — Z01812 Encounter for preprocedural laboratory examination: Secondary | ICD-10-CM | POA: Insufficient documentation

## 2022-08-23 HISTORY — DX: Unspecified osteoarthritis, unspecified site: M19.90

## 2022-08-23 HISTORY — DX: Hypothyroidism, unspecified: E03.9

## 2022-08-23 LAB — COMPREHENSIVE METABOLIC PANEL
ALT: 16 U/L (ref 0–44)
AST: 15 U/L (ref 15–41)
Albumin: 3.8 g/dL (ref 3.5–5.0)
Alkaline Phosphatase: 52 U/L (ref 38–126)
Anion gap: 10 (ref 5–15)
BUN: 14 mg/dL (ref 8–23)
CO2: 22 mmol/L (ref 22–32)
Calcium: 8.8 mg/dL — ABNORMAL LOW (ref 8.9–10.3)
Chloride: 103 mmol/L (ref 98–111)
Creatinine, Ser: 0.72 mg/dL (ref 0.61–1.24)
GFR, Estimated: >60 mL/min (ref >60)
Glucose, Bld: 106 mg/dL — ABNORMAL HIGH (ref 70–99)
Potassium: 3.8 mmol/L (ref 3.5–5.1)
Sodium: 135 mmol/L (ref 135–145)
Total Bilirubin: 0.6 mg/dL (ref 0.3–1.2)
Total Protein: 6.6 g/dL (ref 6.5–8.1)

## 2022-08-23 LAB — CBC
HCT: 39.8 % (ref 39.0–52.0)
Hemoglobin: 13.5 g/dL (ref 13.0–17.0)
MCH: 32 pg (ref 26.0–34.0)
MCHC: 33.9 g/dL (ref 30.0–36.0)
MCV: 94.3 fL (ref 80.0–100.0)
Platelets: 310 10*3/uL (ref 150–400)
RBC: 4.22 MIL/uL (ref 4.22–5.81)
RDW: 12.9 % (ref 11.5–15.5)
WBC: 4.2 10*3/uL (ref 4.0–10.5)
nRBC: 0 % (ref 0.0–0.2)

## 2022-08-23 LAB — SURGICAL PCR SCREEN
MRSA, PCR: NEGATIVE
Staphylococcus aureus: NEGATIVE

## 2022-08-31 ENCOUNTER — Encounter (HOSPITAL_COMMUNITY): Payer: Self-pay | Admitting: Orthopaedic Surgery

## 2022-08-31 NOTE — H&P (Signed)
TOTAL KNEE ADMISSION H&P  Patient is being admitted for left total knee arthroplasty.  Subjective:  Chief Complaint:left knee pain.  HPI: Eddie Young, 77 y.o. male, has a history of pain and functional disability in the left knee due to arthritis and has failed non-surgical conservative treatments for greater than 12 weeks to includeNSAID's and/or analgesics, corticosteriod injections, viscosupplementation injections, flexibility and strengthening excercises, and activity modification.  Onset of symptoms was gradual, starting 4 years ago with gradually worsening course since that time. The patient noted arthroscopy on the left knee(s).  Patient currently rates pain in the left knee(s) at 10 out of 10 with activity. Patient has night pain, worsening of pain with activity and weight bearing, pain that interferes with activities of daily living, pain with passive range of motion, crepitus, and joint swelling.  Patient has evidence of subchondral sclerosis, periarticular osteophytes, and joint space narrowing by imaging studies. There is no active infection.  Patient Active Problem List   Diagnosis Date Noted   Unilateral primary osteoarthritis, right knee 06/21/2022   Unilateral primary osteoarthritis, left knee 09/13/2021   AC (acromioclavicular) arthritis 08/26/2019   Impingement syndrome of left shoulder 08/26/2019   Biceps tendonitis on left 08/26/2019   Pain in left shoulder 10/28/2018   Past Medical History:  Diagnosis Date   Arthritis    GERD (gastroesophageal reflux disease)    Hyperlipidemia    Hypothyroidism    Macular degeneration    Thyroid disease    hyper active    Past Surgical History:  Procedure Laterality Date   ANKLE ARTHROSCOPY WITH REPAIR SUBLUXING TENDON     KNEE CARTILAGE SURGERY     SHOULDER ARTHROSCOPY W/ ROTATOR CUFF REPAIR      No current facility-administered medications for this encounter.   Current Outpatient Medications  Medication Sig Dispense  Refill Last Dose   aspirin EC 81 MG tablet Take 81 mg by mouth in the morning.      calcium carbonate (OS-CAL) 600 MG TABS tablet Take 600 mg by mouth in the morning.      Cyanocobalamin (VITAMIN B-12 PO) Take 2,500 mcg by mouth in the morning.      esomeprazole (NEXIUM) 40 MG capsule Take 40 mg by mouth every other day. In the morning.      ferrous sulfate 325 (65 FE) MG tablet Take 325 mg by mouth every other day. In the morning.      GLUCOSAMINE HCL PO Take 2,000 mg by mouth every evening. With Vitamin D3      methimazole (TAPAZOLE) 5 MG tablet Take 5 mg by mouth every other day. In the evening.      Multiple Vitamins-Minerals (PRESERVISION AREDS 2 PO) Take 1 tablet by mouth in the morning and at bedtime.      Omega-3 Fatty Acids (FISH OIL) 1200 MG CPDR Take 1,200 mg by mouth every evening.      rosuvastatin (CRESTOR) 40 MG tablet Take 20 mg by mouth every evening.      vitamin E 180 MG (400 UNITS) capsule Take 400 Units by mouth every evening.      Allergies  Allergen Reactions   Penicillins Other (See Comments)    Unknown;reaction as child per mother    Social History   Tobacco Use   Smoking status: Never   Smokeless tobacco: Never  Substance Use Topics   Alcohol use: Yes    Alcohol/week: 3.0 standard drinks of alcohol    Types: 3 Cans of beer per week  Comment: occ    Family History  Problem Relation Age of Onset   Heart disease Mother    Thyroid cancer Maternal Grandmother    Colon cancer Neg Hx      Review of Systems  Objective:  Physical Exam Vitals reviewed.  Constitutional:      Appearance: Normal appearance.  HENT:     Head: Normocephalic and atraumatic.  Eyes:     Extraocular Movements: Extraocular movements intact.     Pupils: Pupils are equal, round, and reactive to light.  Cardiovascular:     Rate and Rhythm: Normal rate.     Pulses: Normal pulses.  Pulmonary:     Effort: Pulmonary effort is normal.     Breath sounds: Normal breath sounds.   Abdominal:     Palpations: Abdomen is soft.  Musculoskeletal:     Cervical back: Normal range of motion and neck supple.     Left knee: Bony tenderness and crepitus present. Decreased range of motion. Tenderness present over the medial joint line and lateral joint line. Abnormal alignment.  Neurological:     Mental Status: He is alert and oriented to person, place, and time.  Psychiatric:        Behavior: Behavior normal.     Vital signs in last 24 hours:    Labs:   Estimated body mass index is 28.03 kg/m as calculated from the following:   Height as of 08/23/22: 5\' 11"  (1.803 m).   Weight as of 08/23/22: 91.2 kg.   Imaging Review Plain radiographs demonstrate severe degenerative joint disease of the left knee(s). The overall alignment ismild valgus. The bone quality appears to be good for age and reported activity level.      Assessment/Plan:  End stage arthritis, left knee   The patient history, physical examination, clinical judgment of the provider and imaging studies are consistent with end stage degenerative joint disease of the left knee(s) and total knee arthroplasty is deemed medically necessary. The treatment options including medical management, injection therapy arthroscopy and arthroplasty were discussed at length. The risks and benefits of total knee arthroplasty were presented and reviewed. The risks due to aseptic loosening, infection, stiffness, patella tracking problems, thromboembolic complications and other imponderables were discussed. The patient acknowledged the explanation, agreed to proceed with the plan and consent was signed. Patient is being admitted for inpatient treatment for surgery, pain control, PT, OT, prophylactic antibiotics, VTE prophylaxis, progressive ambulation and ADL's and discharge planning. The patient is planning to be discharged home with home health services

## 2022-09-01 ENCOUNTER — Encounter (HOSPITAL_COMMUNITY): Payer: Self-pay | Admitting: Orthopaedic Surgery

## 2022-09-01 ENCOUNTER — Other Ambulatory Visit: Payer: Self-pay

## 2022-09-01 ENCOUNTER — Observation Stay (HOSPITAL_COMMUNITY): Payer: Medicare HMO

## 2022-09-01 ENCOUNTER — Observation Stay (HOSPITAL_COMMUNITY)
Admission: RE | Admit: 2022-09-01 | Discharge: 2022-09-02 | Disposition: A | Discharge status: Home Health | Payer: Medicare HMO | Source: Ambulatory Visit | Attending: Orthopaedic Surgery | Admitting: Orthopaedic Surgery

## 2022-09-01 ENCOUNTER — Encounter (HOSPITAL_COMMUNITY)
Admission: RE | Disposition: A | Discharge status: Home Health | Payer: Self-pay | Source: Ambulatory Visit | Attending: Orthopaedic Surgery

## 2022-09-01 ENCOUNTER — Ambulatory Visit (HOSPITAL_COMMUNITY): Payer: Medicare HMO | Admitting: Anesthesiology

## 2022-09-01 DIAGNOSIS — M1712 Unilateral primary osteoarthritis, left knee: Secondary | ICD-10-CM

## 2022-09-01 DIAGNOSIS — Z79899 Other long term (current) drug therapy: Secondary | ICD-10-CM | POA: Insufficient documentation

## 2022-09-01 DIAGNOSIS — E039 Hypothyroidism, unspecified: Secondary | ICD-10-CM | POA: Diagnosis not present

## 2022-09-01 DIAGNOSIS — Z7982 Long term (current) use of aspirin: Secondary | ICD-10-CM | POA: Insufficient documentation

## 2022-09-01 DIAGNOSIS — Z01818 Encounter for other preprocedural examination: Secondary | ICD-10-CM

## 2022-09-01 DIAGNOSIS — Z96652 Presence of left artificial knee joint: Secondary | ICD-10-CM

## 2022-09-01 HISTORY — PX: TOTAL KNEE ARTHROPLASTY: SHX125

## 2022-09-01 SURGERY — ARTHROPLASTY, KNEE, TOTAL
Anesthesia: Spinal | Site: Knee | Laterality: Left

## 2022-09-01 MED ORDER — EPHEDRINE SULFATE-NACL 50-0.9 MG/10ML-% IV SOSY
PREFILLED_SYRINGE | INTRAVENOUS | Status: DC | PRN
  Administered 2022-09-01: 5 mg via INTRAVENOUS

## 2022-09-01 MED ORDER — METHIMAZOLE 5 MG PO TABS
5.0000 mg | ORAL_TABLET | ORAL | Status: DC
  Administered 2022-09-02: 5 mg via ORAL
  Filled 2022-09-01: qty 1

## 2022-09-01 MED ORDER — FERROUS SULFATE 325 (65 FE) MG PO TABS
325.0000 mg | ORAL_TABLET | ORAL | Status: DC
  Administered 2022-09-02: 325 mg via ORAL
  Filled 2022-09-01: qty 1

## 2022-09-01 MED ORDER — CHLORHEXIDINE GLUCONATE 0.12 % MT SOLN
15.0000 mL | Freq: Once | OROMUCOSAL | Status: AC
  Administered 2022-09-01: 15 mL via OROMUCOSAL

## 2022-09-01 MED ORDER — TRANEXAMIC ACID-NACL 1000-0.7 MG/100ML-% IV SOLN
1000.0000 mg | INTRAVENOUS | Status: AC
  Administered 2022-09-01: 1000 mg via INTRAVENOUS
  Filled 2022-09-01: qty 100

## 2022-09-01 MED ORDER — METOCLOPRAMIDE HCL 5 MG/ML IJ SOLN: 5.0000 mg | Freq: Three times a day (TID) | INTRAMUSCULAR | Status: DC | PRN

## 2022-09-01 MED ORDER — METHOCARBAMOL 500 MG PO TABS
ORAL_TABLET | ORAL | Status: AC
  Filled 2022-09-01: qty 1

## 2022-09-01 MED ORDER — OXYCODONE HCL 5 MG PO TABS
10.0000 mg | ORAL_TABLET | ORAL | Status: DC | PRN
  Administered 2022-09-02: 10 mg via ORAL
  Filled 2022-09-01: qty 2

## 2022-09-01 MED ORDER — ROSUVASTATIN CALCIUM 20 MG PO TABS
20.0000 mg | ORAL_TABLET | Freq: Every evening | ORAL | Status: DC
  Administered 2022-09-01: 20 mg via ORAL
  Filled 2022-09-01: qty 1

## 2022-09-01 MED ORDER — LIDOCAINE 2% (20 MG/ML) 5 ML SYRINGE
INTRAMUSCULAR | Status: DC | PRN
  Administered 2022-09-01 (×2): 50 mg via INTRAVENOUS

## 2022-09-01 MED ORDER — EPHEDRINE 5 MG/ML INJ
INTRAVENOUS | Status: AC
  Filled 2022-09-01: qty 5

## 2022-09-01 MED ORDER — ROPIVACAINE HCL 5 MG/ML IJ SOLN
INTRAMUSCULAR | Status: DC | PRN
Start: 2022-09-01 — End: 2022-09-01
  Administered 2022-09-01: 20 mL via PERINEURAL

## 2022-09-01 MED ORDER — OXYCODONE HCL 5 MG PO TABS
5.0000 mg | ORAL_TABLET | ORAL | Status: DC | PRN
  Administered 2022-09-01: 5 mg via ORAL
  Administered 2022-09-02: 10 mg via ORAL
  Filled 2022-09-01 (×3): qty 2

## 2022-09-01 MED ORDER — PROMETHAZINE HCL 25 MG/ML IJ SOLN: 6.2500 mg | INTRAMUSCULAR | Status: DC | PRN

## 2022-09-01 MED ORDER — OXYCODONE HCL 5 MG PO TABS
ORAL_TABLET | ORAL | Status: AC
  Filled 2022-09-01: qty 1

## 2022-09-01 MED ORDER — GLYCOPYRROLATE 0.2 MG/ML IJ SOLN
INTRAMUSCULAR | Status: AC
  Filled 2022-09-01: qty 1

## 2022-09-01 MED ORDER — CEFAZOLIN SODIUM-DEXTROSE 2-4 GM/100ML-% IV SOLN
2.0000 g | INTRAVENOUS | Status: AC
  Administered 2022-09-01: 2 g via INTRAVENOUS
  Filled 2022-09-01: qty 100

## 2022-09-01 MED ORDER — DIPHENHYDRAMINE HCL 12.5 MG/5ML PO ELIX: 12.5000 mg | ORAL_SOLUTION | ORAL | Status: DC | PRN

## 2022-09-01 MED ORDER — PROPOFOL 500 MG/50ML IV EMUL
INTRAVENOUS | Status: DC | PRN
  Administered 2022-09-01: 75 ug/kg/min via INTRAVENOUS

## 2022-09-01 MED ORDER — ORAL CARE MOUTH RINSE: 15.0000 mL | Freq: Once | OROMUCOSAL | Status: AC

## 2022-09-01 MED ORDER — ASPIRIN 81 MG PO CHEW
81.0000 mg | CHEWABLE_TABLET | Freq: Two times a day (BID) | ORAL | Status: DC
  Administered 2022-09-01 – 2022-09-02 (×2): 81 mg via ORAL
  Filled 2022-09-01 (×2): qty 1

## 2022-09-01 MED ORDER — HYDROMORPHONE HCL 1 MG/ML IJ SOLN
0.5000 mg | INTRAMUSCULAR | Status: DC | PRN
  Administered 2022-09-01: 1 mg via INTRAVENOUS
  Filled 2022-09-01: qty 1

## 2022-09-01 MED ORDER — ONDANSETRON HCL 4 MG/2ML IJ SOLN
INTRAMUSCULAR | Status: DC | PRN
  Administered 2022-09-01: 4 mg via INTRAVENOUS

## 2022-09-01 MED ORDER — HYDROMORPHONE HCL 1 MG/ML IJ SOLN: 0.2500 mg | INTRAMUSCULAR | Status: DC | PRN

## 2022-09-01 MED ORDER — SODIUM CHLORIDE 0.9 % IV SOLN: INTRAVENOUS | Status: DC

## 2022-09-01 MED ORDER — POVIDONE-IODINE 10 % EX SWAB: 2.0000 | Freq: Once | CUTANEOUS | Status: AC

## 2022-09-01 MED ORDER — VITAMIN E 45 MG (100 UNIT) PO CAPS
400.0000 [IU] | ORAL_CAPSULE | Freq: Every evening | ORAL | Status: DC
  Administered 2022-09-01: 400 [IU] via ORAL
  Filled 2022-09-01 (×2): qty 4

## 2022-09-01 MED ORDER — MENTHOL 3 MG MT LOZG: 1.0000 | LOZENGE | OROMUCOSAL | Status: DC | PRN

## 2022-09-01 MED ORDER — DOCUSATE SODIUM 100 MG PO CAPS
100.0000 mg | ORAL_CAPSULE | Freq: Two times a day (BID) | ORAL | Status: DC
  Administered 2022-09-01 – 2022-09-02 (×2): 100 mg via ORAL
  Filled 2022-09-01 (×2): qty 1

## 2022-09-01 MED ORDER — PROPOFOL 1000 MG/100ML IV EMUL
INTRAVENOUS | Status: AC
  Filled 2022-09-01: qty 300

## 2022-09-01 MED ORDER — PROPOFOL 10 MG/ML IV BOLUS
INTRAVENOUS | Status: AC
  Filled 2022-09-01: qty 20

## 2022-09-01 MED ORDER — METHOCARBAMOL 500 MG IVPB - SIMPLE MED: 500.0000 mg | Freq: Four times a day (QID) | INTRAVENOUS | Status: DC | PRN

## 2022-09-01 MED ORDER — METOCLOPRAMIDE HCL 5 MG PO TABS
5.0000 mg | ORAL_TABLET | Freq: Three times a day (TID) | ORAL | Status: DC | PRN
  Administered 2022-09-02: 10 mg via ORAL
  Filled 2022-09-01: qty 2

## 2022-09-01 MED ORDER — FENTANYL CITRATE PF 50 MCG/ML IJ SOSY
100.0000 ug | PREFILLED_SYRINGE | Freq: Once | INTRAMUSCULAR | Status: AC
  Administered 2022-09-01: 100 ug via INTRAVENOUS
  Filled 2022-09-01: qty 2

## 2022-09-01 MED ORDER — BUPIVACAINE IN DEXTROSE 0.75-8.25 % IT SOLN
INTRATHECAL | Status: DC | PRN
  Administered 2022-09-01: 1.8 mL via INTRATHECAL

## 2022-09-01 MED ORDER — PHENOL 1.4 % MT LIQD: 1.0000 | OROMUCOSAL | Status: DC | PRN

## 2022-09-01 MED ORDER — ONDANSETRON HCL 4 MG/2ML IJ SOLN
INTRAMUSCULAR | Status: AC
  Filled 2022-09-01: qty 2

## 2022-09-01 MED ORDER — OXYCODONE HCL 5 MG PO TABS
5.0000 mg | ORAL_TABLET | Freq: Once | ORAL | Status: AC | PRN
  Administered 2022-09-01: 5 mg via ORAL

## 2022-09-01 MED ORDER — STERILE WATER FOR IRRIGATION IR SOLN
Status: DC | PRN
  Administered 2022-09-01: 2000 mL

## 2022-09-01 MED ORDER — ACETAMINOPHEN 325 MG PO TABS
325.0000 mg | ORAL_TABLET | Freq: Four times a day (QID) | ORAL | Status: DC | PRN
  Filled 2022-09-01: qty 2

## 2022-09-01 MED ORDER — ACETAMINOPHEN 325 MG PO TABS
325.0000 mg | ORAL_TABLET | Freq: Four times a day (QID) | ORAL | Status: DC | PRN
  Administered 2022-09-01 – 2022-09-02 (×3): 650 mg via ORAL
  Filled 2022-09-01 (×2): qty 2

## 2022-09-01 MED ORDER — ONDANSETRON HCL 4 MG/2ML IJ SOLN
INTRAMUSCULAR | Status: AC
  Filled 2022-09-01: qty 6

## 2022-09-01 MED ORDER — ONDANSETRON HCL 4 MG PO TABS: 4.0000 mg | ORAL_TABLET | Freq: Four times a day (QID) | ORAL | Status: DC | PRN

## 2022-09-01 MED ORDER — GLYCOPYRROLATE 0.2 MG/ML IJ SOLN
INTRAMUSCULAR | Status: DC | PRN
  Administered 2022-09-01: .2 mg via INTRAVENOUS

## 2022-09-01 MED ORDER — PANTOPRAZOLE SODIUM 40 MG PO TBEC
40.0000 mg | DELAYED_RELEASE_TABLET | Freq: Every day | ORAL | Status: DC
  Administered 2022-09-01 – 2022-09-02 (×2): 40 mg via ORAL
  Filled 2022-09-01 (×2): qty 1

## 2022-09-01 MED ORDER — 0.9 % SODIUM CHLORIDE (POUR BTL) OPTIME
TOPICAL | Status: DC | PRN
  Administered 2022-09-01: 1000 mL

## 2022-09-01 MED ORDER — METHOCARBAMOL 500 MG PO TABS
500.0000 mg | ORAL_TABLET | Freq: Four times a day (QID) | ORAL | Status: DC | PRN
  Administered 2022-09-01 – 2022-09-02 (×3): 500 mg via ORAL
  Filled 2022-09-01 (×2): qty 1

## 2022-09-01 MED ORDER — HYDROMORPHONE HCL 1 MG/ML IJ SOLN
INTRAMUSCULAR | Status: AC
  Filled 2022-09-01: qty 1

## 2022-09-01 MED ORDER — LIDOCAINE HCL (PF) 2 % IJ SOLN
INTRAMUSCULAR | Status: AC
  Filled 2022-09-01: qty 15

## 2022-09-01 MED ORDER — ALUM & MAG HYDROXIDE-SIMETH 200-200-20 MG/5ML PO SUSP
30.0000 mL | ORAL | Status: DC | PRN
  Administered 2022-09-02 (×3): 30 mL via ORAL
  Filled 2022-09-01 (×3): qty 30

## 2022-09-01 MED ORDER — SODIUM CHLORIDE 0.9 % IR SOLN
Status: DC | PRN
  Administered 2022-09-01: 1000 mL

## 2022-09-01 MED ORDER — LACTATED RINGERS IV SOLN: INTRAVENOUS | Status: DC

## 2022-09-01 MED ORDER — DEXAMETHASONE SODIUM PHOSPHATE 10 MG/ML IJ SOLN
INTRAMUSCULAR | Status: AC
  Filled 2022-09-01: qty 3

## 2022-09-01 MED ORDER — BUPIVACAINE-EPINEPHRINE 0.25% -1:200000 IJ SOLN
INTRAMUSCULAR | Status: DC | PRN
  Administered 2022-09-01: 30 mL

## 2022-09-01 MED ORDER — DEXAMETHASONE SODIUM PHOSPHATE 10 MG/ML IJ SOLN
INTRAMUSCULAR | Status: DC | PRN
  Administered 2022-09-01: 4 mg via INTRAVENOUS

## 2022-09-01 MED ORDER — BUPIVACAINE-EPINEPHRINE 0.25% -1:200000 IJ SOLN
INTRAMUSCULAR | Status: AC
  Filled 2022-09-01: qty 1

## 2022-09-01 MED ORDER — CEFAZOLIN SODIUM-DEXTROSE 1-4 GM/50ML-% IV SOLN
1.0000 g | Freq: Four times a day (QID) | INTRAVENOUS | Status: AC
  Administered 2022-09-01 (×2): 1 g via INTRAVENOUS
  Filled 2022-09-01 (×2): qty 50

## 2022-09-01 MED ORDER — DEXAMETHASONE SODIUM PHOSPHATE 10 MG/ML IJ SOLN
INTRAMUSCULAR | Status: AC
  Filled 2022-09-01: qty 1

## 2022-09-01 MED ORDER — OXYCODONE HCL 5 MG/5ML PO SOLN: 5.0000 mg | Freq: Once | ORAL | Status: AC | PRN

## 2022-09-01 MED ORDER — MIDAZOLAM HCL 2 MG/2ML IJ SOLN
2.0000 mg | Freq: Once | INTRAMUSCULAR | Status: AC
  Administered 2022-09-01: 1 mg via INTRAVENOUS
  Filled 2022-09-01: qty 2

## 2022-09-01 MED ORDER — ORAL CARE MOUTH RINSE: 15.0000 mL | OROMUCOSAL | Status: DC | PRN

## 2022-09-01 MED ORDER — ONDANSETRON HCL 4 MG/2ML IJ SOLN: 4.0000 mg | Freq: Four times a day (QID) | INTRAMUSCULAR | Status: DC | PRN

## 2022-09-01 SURGICAL SUPPLY — 60 items
APL SKNCLS STERI-STRIP NONHPOA (GAUZE/BANDAGES/DRESSINGS)
BAG COUNTER SPONGE SURGICOUNT (BAG) IMPLANT
BAG SPEC THK2 15X12 ZIP CLS (MISCELLANEOUS) ×1
BAG SPNG CNTER NS LX DISP (BAG)
BAG ZIPLOCK 12X15 (MISCELLANEOUS) ×1 IMPLANT
BASEPLATE CMT PS KNEE F 0D LT (Joint) IMPLANT
BENZOIN TINCTURE PRP APPL 2/3 (GAUZE/BANDAGES/DRESSINGS) IMPLANT
BLADE SAG 18X100X1.27 (BLADE) ×1 IMPLANT
BLADE SURG SZ10 CARB STEEL (BLADE) ×2 IMPLANT
BNDG CMPR 6 X 5 YARDS HK CLSR (GAUZE/BANDAGES/DRESSINGS) ×2
BNDG ELASTIC 6INX 5YD STR LF (GAUZE/BANDAGES/DRESSINGS) ×2 IMPLANT
BOWL SMART MIX CTS (DISPOSABLE) IMPLANT
CEMENT BONE R 1X40 (Cement) IMPLANT
CEMENT BONE SIMPLEX SPEEDSET (Cement) IMPLANT
COOLER ICEMAN CLASSIC (MISCELLANEOUS) ×1 IMPLANT
COVER SURGICAL LIGHT HANDLE (MISCELLANEOUS) ×1 IMPLANT
CUFF TOURN SGL QUICK 34 (TOURNIQUET CUFF) ×1
CUFF TRNQT CYL 34X4.125X (TOURNIQUET CUFF) ×1 IMPLANT
DRAPE INCISE IOBAN 66X45 STRL (DRAPES) ×1 IMPLANT
DRAPE U-SHAPE 47X51 STRL (DRAPES) ×1 IMPLANT
DURAPREP 26ML APPLICATOR (WOUND CARE) ×1 IMPLANT
ELECT BLADE TIP CTD 4 INCH (ELECTRODE) ×1 IMPLANT
ELECT REM PT RETURN 15FT ADLT (MISCELLANEOUS) ×1 IMPLANT
GAUZE PAD ABD 8X10 STRL (GAUZE/BANDAGES/DRESSINGS) ×2 IMPLANT
GAUZE SPONGE 4X4 12PLY STRL (GAUZE/BANDAGES/DRESSINGS) ×1 IMPLANT
GAUZE XEROFORM 1X8 LF (GAUZE/BANDAGES/DRESSINGS) IMPLANT
GLOVE BIO SURGEON STRL SZ7.5 (GLOVE) ×1 IMPLANT
GLOVE BIOGEL PI IND STRL 8 (GLOVE) ×2 IMPLANT
GLOVE ECLIPSE 8.0 STRL XLNG CF (GLOVE) ×1 IMPLANT
GOWN STRL REUS W/ TWL XL LVL3 (GOWN DISPOSABLE) ×2 IMPLANT
GOWN STRL REUS W/TWL XL LVL3 (GOWN DISPOSABLE) ×2
HANDPIECE INTERPULSE COAX TIP (DISPOSABLE) ×1
HOLDER FOLEY CATH W/STRAP (MISCELLANEOUS) IMPLANT
IMMOBILIZER KNEE 20 (SOFTGOODS) ×1
IMMOBILIZER KNEE 20 THIGH 36 (SOFTGOODS) ×1 IMPLANT
KIT TURNOVER KIT A (KITS) IMPLANT
NS IRRIG 1000ML POUR BTL (IV SOLUTION) ×1 IMPLANT
PACK TOTAL KNEE CUSTOM (KITS) ×1 IMPLANT
PAD COLD SHLDR WRAP-ON (PAD) ×1 IMPLANT
PADDING CAST COTTON 6X4 STRL (CAST SUPPLIES) ×2 IMPLANT
PIN DRILL HDLS TROCAR 75 4PK (PIN) IMPLANT
PROTECTOR NERVE ULNAR (MISCELLANEOUS) ×1 IMPLANT
PSN FEM CR CMT CCR STD SZ10 L (Joint) ×1 IMPLANT
SCREW FEMALE HEX FIX 25X2.5 (ORTHOPEDIC DISPOSABLE SUPPLIES) IMPLANT
SET HNDPC FAN SPRY TIP SCT (DISPOSABLE) ×1 IMPLANT
SET PAD KNEE POSITIONER (MISCELLANEOUS) ×1 IMPLANT
SPIKE FLUID TRANSFER (MISCELLANEOUS) IMPLANT
STAPLER VISISTAT 35W (STAPLE) IMPLANT
STEM ARTISURF EF 12 SZ8-11 (Stem) IMPLANT
STEM POLY PAT PLY 32M KNEE (Knees) IMPLANT
STRIP CLOSURE SKIN 1/2X4 (GAUZE/BANDAGES/DRESSINGS) IMPLANT
SURFACE ARTC PRSNA CCR SZ10 L (Joint) IMPLANT
SUT MNCRL AB 4-0 PS2 18 (SUTURE) IMPLANT
SUT VIC AB 0 CT1 27 (SUTURE) ×1
SUT VIC AB 0 CT1 27XBRD ANTBC (SUTURE) ×1 IMPLANT
SUT VIC AB 1 CT1 36 (SUTURE) ×2 IMPLANT
SUT VIC AB 2-0 CT1 27 (SUTURE) ×2
SUT VIC AB 2-0 CT1 TAPERPNT 27 (SUTURE) ×2 IMPLANT
TRAY FOLEY MTR SLVR 16FR STAT (SET/KITS/TRAYS/PACK) IMPLANT
WATER STERILE IRR 1000ML POUR (IV SOLUTION) ×2 IMPLANT

## 2022-09-01 NOTE — Transfer of Care (Signed)
Immediate Anesthesia Transfer of Care Note  Patient: Eddie Young  Procedure(s) Performed: Procedure(s): LEFT TOTAL KNEE ARTHROPLASTY (Left)  Patient Location: PACU  Anesthesia Type:Spinal  Level of Consciousness:  sedated, patient cooperative and responds to stimulation  Airway & Oxygen Therapy:Patient Spontanous Breathing and Patient connected to face mask oxgen  Post-op Assessment:  Report given to PACU RN and Post -op Vital signs reviewed and stable  Post vital signs:  Reviewed and stable  Last Vitals:  Vitals:   09/01/22 0815 09/01/22 0820  BP: (!) 153/76 (!) 140/67  Pulse: (!) 46 (!) 44  Resp: 17 (!) 7  Temp:    SpO2: 96% 93%    Complications: No apparent anesthesia complications

## 2022-09-01 NOTE — Anesthesia Preprocedure Evaluation (Signed)
Anesthesia Evaluation  Patient identified by MRN, date of birth, ID band Patient awake    Reviewed: Allergy & Precautions, H&P , NPO status , Patient's Chart, lab work & pertinent test results  Airway Mallampati: II  TM Distance: >3 FB Neck ROM: Full    Dental no notable dental hx.    Pulmonary neg pulmonary ROS   Pulmonary exam normal breath sounds clear to auscultation       Cardiovascular negative cardio ROS Normal cardiovascular exam Rhythm:Regular Rate:Normal     Neuro/Psych negative neurological ROS  negative psych ROS   GI/Hepatic negative GI ROS, Neg liver ROS,GERD  ,,  Endo/Other  negative endocrine ROSHypothyroidism    Renal/GU negative Renal ROS  negative genitourinary   Musculoskeletal  (+) Arthritis , Osteoarthritis,    Abdominal   Peds negative pediatric ROS (+)  Hematology negative hematology ROS (+)   Anesthesia Other Findings   Reproductive/Obstetrics negative OB ROS                             Anesthesia Physical Anesthesia Plan  ASA: 2  Anesthesia Plan: Spinal   Post-op Pain Management: Regional block* and Minimal or no pain anticipated   Induction: Intravenous  PONV Risk Score and Plan: 1 and Ondansetron and Treatment may vary due to age or medical condition  Airway Management Planned: Simple Face Mask  Additional Equipment:   Intra-op Plan:   Post-operative Plan:   Informed Consent: I have reviewed the patients History and Physical, chart, labs and discussed the procedure including the risks, benefits and alternatives for the proposed anesthesia with the patient or authorized representative who has indicated his/her understanding and acceptance.     Dental advisory given  Plan Discussed with: CRNA  Anesthesia Plan Comments:        Anesthesia Quick Evaluation

## 2022-09-01 NOTE — Anesthesia Postprocedure Evaluation (Signed)
Anesthesia Post Note  Patient: Eddie Young  Procedure(s) Performed: LEFT TOTAL KNEE ARTHROPLASTY (Left: Knee)     Patient location during evaluation: PACU Anesthesia Type: Spinal Level of consciousness: awake and alert Pain management: pain level controlled Vital Signs Assessment: post-procedure vital signs reviewed and stable Respiratory status: spontaneous breathing, nonlabored ventilation and respiratory function stable Cardiovascular status: blood pressure returned to baseline and stable Postop Assessment: no apparent nausea or vomiting Anesthetic complications: no   No notable events documented.  Last Vitals:  Vitals:   09/01/22 1130 09/01/22 1145  BP: (!) 145/74 (!) 145/70  Pulse: (!) 43 (!) 47  Resp: 11 13  Temp:    SpO2: 97% 97%    Last Pain:  Vitals:   09/01/22 1130  TempSrc:   PainSc: 0-No pain                 Lowella Curb

## 2022-09-01 NOTE — Interval H&P Note (Signed)
History and Physical Interval Note:   Patient understands that he is here today for a left total knee replacement to treat his severe left knee arthritis.  There has been no acute or interval change in his medical status.  The risks and benefits of surgery been discussed in detail and informed consent has been obtained.  The left operative knee has been marked.  09/01/2022 6:54 AM  Eddie Young  has presented today for surgery, with the diagnosis of osteoarthritis left knee.  The various methods of treatment have been discussed with the patient and family. After consideration of risks, benefits and other options for treatment, the patient has consented to  Procedure(s): LEFT TOTAL KNEE ARTHROPLASTY (Left) as a surgical intervention.  The patient's history has been reviewed, patient examined, no change in status, stable for surgery.  I have reviewed the patient's chart and labs.  Questions were answered to the patient's satisfaction.     Kathryne Hitch

## 2022-09-01 NOTE — Anesthesia Procedure Notes (Signed)
Anesthesia Regional Block: Adductor canal block   Pre-Anesthetic Checklist: , timeout performed,  Correct Patient, Correct Site, Correct Laterality,  Correct Procedure, Correct Position, site marked,  Risks and benefits discussed,  Surgical consent,  Pre-op evaluation,  At surgeon's request and post-op pain management  Laterality: Left  Prep: chloraprep       Needles:  Injection technique: Single-shot  Needle Type: Stimiplex     Needle Length: 9cm  Needle Gauge: 21     Additional Needles:   Procedures:,,,, ultrasound used (permanent image in chart),,    Narrative:  Start time: 09/01/2022 8:15 AM End time: 09/01/2022 8:20 AM Injection made incrementally with aspirations every 5 mL.  Performed by: Personally  Anesthesiologist: Lowella Curb, MD

## 2022-09-01 NOTE — Anesthesia Procedure Notes (Signed)
Spinal  Patient location during procedure: OR Start time: 09/01/2022 8:58 AM End time: 09/01/2022 8:58 AM Reason for block: surgical anesthesia Staffing Performed: resident/CRNA  Resident/CRNA: Deone Omahoney, CRNA Performed by: Theodosia Quay, CRNA Authorized by: Lowella Curb, MD   Preanesthetic Checklist Completed: patient identified, IV checked, site marked, risks and benefits discussed, surgical consent, monitors and equipment checked, pre-op evaluation and timeout performed Spinal Block Patient position: sitting Prep: Betadine Patient monitoring: heart rate, cardiac monitor, continuous pulse ox and blood pressure Approach: midline Location: L4-5 Injection technique: single-shot Needle Needle type: Spinocan and Pencan  Needle gauge: 24 G Needle length: 9 cm Needle insertion depth: 7 cm Assessment Sensory level: T6 Events: CSF return Additional Notes Lot OK, exp date checked.  -heme, -para, Csf clear, VSS.

## 2022-09-01 NOTE — Op Note (Signed)
Operative Note  Date of operation: 09/01/2022 Preoperative diagnosis: Left knee primary osteoarthritis Postoperative diagnosis: Same  Procedure: Left cemented total knee arthroplasty  Implants: Biomet/Zimmer persona knee system Implant Name Type Inv. Item Serial No. Manufacturer Lot No. LRB No. Used Action  CEMENT BONE R 1X40 - [id number] Cement CEMENT BONE R 1X40  ZIMMER RECON(ORTH,TRAU,BIO,SG) [id number] Left 2 Implanted  STEM POLY PAT PLY 43M KNEE - [id number] Knees STEM POLY PAT PLY 43M KNEE  ZIMMER RECON(ORTH,TRAU,BIO,SG) [id number] Left 1 Implanted  STEM ARTISURF EF 12 SZ8-11 - [id number] Stem STEM ARTISURF EF 12 SZ8-11  ZIMMER RECON(ORTH,TRAU,BIO,SG) [id number] Left 1 Implanted  PSN FEM CR CMT CCR STD SZ10 L - [id number] Joint PSN FEM CR CMT CCR STD SZ10 L  ZIMMER RECON(ORTH,TRAU,BIO,SG) [id number] Left 1 Implanted  BASEPLATE CMT PS KNEE F 0D LT - [id number] Joint BASEPLATE CMT PS KNEE F 0D LT  ZIMMER RECON(ORTH,TRAU,BIO,SG) [id number] Left 1 Implanted   Surgeon: Vanita Panda. Magnus Ivan, MD Assistant: Rexene Edison, PA-C  Anesthesia: #1 left lower extremity adductor canal block, #2 spinal, #3 local Antibiotics: IV Ancef Tourniquet time: Under 1 hour EBL: Less than 100 cc Complications: None  Indications: The patient is a 77 year old gentleman with debilitating arthritis involving his left knee that is been well-documented.  He has valgus malalignment of that knee and x-ray showing bone-on-bone wear.  He has tried and failed conservative treatment for well over a year now.  At this point his left knee pain is daily and it is detrimentally affecting his mobility, his quality of life and his actives daily living to the point he does wish to proceed with a knee replacement and we agree with this and have recommended that as well.  We did talk about the risks of acute blood loss anemia, nerve or vessel injury, fracture, infection, DVT, implant failure and wound healing issues.  He understands her  goals are hopefully decrease pain, improve mobility, and improve quality of life.  Procedure description: After informed consent was obtained and the appropriate left knee was marked, anesthesia obtained a left lower extremity adductor canal block in the holding room.  The patient was then brought to the operating room and set up on the operating table where spinal anesthesia was obtained.  He was then laid in supine position on the operating table and a Foley catheter was placed.  A nonsterile tourniquet placed around his upper left thigh and his left thigh, knee, leg and ankle were prepped and draped in DuraPrep and sterile drapes.  A timeout was called and he was identified as the correct patient the correct left knee.  An Esmarch was used to wrap out the leg and the tourniquet was applied at 300 mm of pressure.  With the knee extended we made a midline incision over the patella and carried this proximally and distally.  Dissection was carried down to the joint and a medial parapatellar arthrotomy was made finding a moderate joint effusion.  With the knee flexed there was significant cartilage wear throughout the knee especially the lateral compartment with his valgus malalignment.  There is bone-on-bone wear.  Remnants of the ACL as well as medial and lateral meniscus were removed.  Using a extramedullary cutting guide we made our proximal tibia cut correction for varus and valgus and a 7 degree slope.  We made this cut to take 2 mm off the low side and we did backed this down to more millimeters.  We then used a intramedullary drill for our distal femoral  cutting guide setting this for a left knee at 5 degrees externally rotated and a 10 mm distal femoral cut.  We made that cut without difficulty and brought the knee back down to full extension and achieved full extension with a 10 mm extension block.  We then went back to the femur and put a femoral sizing guide based off of the epicondylar axis.  Based off  of this we chose a size 10 femur.  We put a 4-in-1 cutting block for a size 10 femur and made our anterior and posterior cuts followed by the chamfer cuts.  We then went back to the tibia and chose a size F left tibial tray for coverage over the tibial plateau setting the rotation of the tibial tubercle and the femur.  The keel punch and drill hole was then made off of this for the tibia.  We then trialed our size F left tibia tray followed by our size 10 left CR standard femur.  We placed a 10 mm medial congruent polyethylene insert and went up to a 12 mm insert and we are pleased with range of motion and stability with the 12 mm thickness left medial congruent polyethylene insert.  We then made a patella cut and drilled 3 holes for a size 32 patella button.  With all trial instrumentation the knee with that the knee through several cycles of motion and we are pleased with range of motion and stability.  We then removed all transportation of the knee and irrigate the knee with normal saline solution.  We then placed our Marcaine with epinephrine around the arthrotomy.  With the knee in a flexed position we mixed our cement and then cemented our Biomet Zimmer persona tibial tray for a left knee size F followed by our size 10 left CR standard femur.  We cleaned excess cement debris from the knee and placed our 12 mm thickness left medial congruent polyethylene insert and then cemented our size 32 patella button.  We then held the knee compressed and extended while the cement hardened.  Once that it hardened we let the tourniquet down and hemostasis was obtained with electrocautery.  The arthrotomy was closed with interrupted #1 Vicryl suture followed by 0 Vicryl to close the deep tissue and 2-0 Vicryl to close the subcutaneous tissue.  The skin was closed with staples.  Well-padded sterile dressing was applied.  The patient was taken to the recovery room in stable condition.  Rexene Edison, PA-C did assist during the  entire case and beginning to end and his assistance was medically necessary and crucial for soft tissue management and retraction, helping guide implant placement and a layered closure of the wound.

## 2022-09-01 NOTE — TOC Transition Note (Signed)
Transition of Care [facility]) - CM/SW Discharge Note   Patient Details  Name: JARELL COLAROSSI MRN: [id number] Date of Birth: August 22, 1945  Transition of Care [facility]) CM/SW Contact:  Amada Jupiter, LCSW Phone Number: 09/01/2022, 2:31 PM   Clinical Narrative:     Met with pt and wife who confirm need for RW and no DME agency preference - order placed with [employer] for delivery to room this afternoon.  HHPT prearranged with [facility] via MD office.  No further TOC needs.  Final next level of care: Home w Home Health Services Barriers to Discharge: No Barriers Identified   Patient Goals and CMS Choice      Discharge Placement                         Discharge Plan and Services Additional resources added to the After Visit Summary for                  DME Arranged: Walker rolling DME Agency: [employer] Date DME Agency Contacted: 09/01/22 Time DME Agency Contacted: [phone] Representative spoke with at DME Agency: Loraine Leriche HH Arranged: PT HH Agency: [facility]        Social Determinants of Health (SDOH) Interventions SDOH Screenings   Food Insecurity: Low Risk  (08/29/2022)   Received from [facility]  Utilities: Low Risk  (08/29/2022)   Received from [facility]  Social Connections: Unknown (05/24/2021)   Received from [facility]  Tobacco Use: Low Risk  (09/01/2022)     Readmission Risk Interventions     No data to display

## 2022-09-01 NOTE — Evaluation (Signed)
Physical Therapy Evaluation Patient Details Name: Eddie Young MRN: [id number] DOB: Jun 12, 1945 Today's Date: 09/01/2022  History of Present Illness  77 yo male presents to therapy s/p L TKA on 09/01/2022 due to failure of conservative measures. 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  Clinical Impression     Eddie Young is a 77 y.o. male POD 0 s/p L TKA. Patient reports IND with mobility at baseline. Patient is now limited by functional impairments (see PT problem list below) and requires CGA for bed mobility and CGA and cues for transfers. Patient was able to ambulate 45 feet with RW and CGA level of assist. Patient instructed in exercise to facilitate ROM and circulation to manage edema. Patient will benefit from continued skilled PT interventions to address impairments and progress towards PLOF. Acute PT will follow to progress mobility and stair training in preparation for safe discharge home with family support and [facility] services with pt indicating scheduled for Sunday 8/25.       If plan is discharge home, recommend the following: A little help with walking and/or transfers;A little help with bathing/dressing/bathroom;Assistance with cooking/housework;Help with stairs or ramp for entrance;Assist for transportation   Can travel by private vehicle        Equipment Recommendations Rolling walker (2 wheels)  Recommendations for Other Services       Functional Status Assessment Patient has had a recent decline in their functional status and demonstrates the ability to make significant improvements in function in a reasonable and predictable amount of time.     Precautions / Restrictions Precautions Precautions: Fall;Knee Restrictions Weight Bearing Restrictions: No      Mobility  Bed Mobility Overal bed mobility: Needs Assistance Bed  Mobility: Supine to Sit     Supine to sit: HOB elevated, Contact guard     General bed mobility comments: min cues    Transfers Overall transfer level: Needs assistance Equipment used: Rolling walker (2 wheels) Transfers: Sit to/from Stand Sit to Stand: Contact guard assist, From elevated surface           General transfer comment: min cues, pt self limiting L knee flexion in sitting    Ambulation/Gait Ambulation/Gait assistance: Contact guard assist Gait Distance (Feet): 45 Feet Assistive device: Rolling walker (2 wheels) Gait Pattern/deviations: Step-to pattern, Antalgic, Trunk flexed Gait velocity: decreased     General Gait Details: minimal reliance on R to offload L LE in stance phase  Stairs            Wheelchair Mobility     Tilt Bed    Modified Rankin (Stroke Patients Only)       Balance Overall balance assessment: Needs assistance Sitting-balance support: Feet supported Sitting balance-Leahy Scale: Good     Standing balance support: Bilateral upper extremity supported, During functional activity, Reliant on assistive device for balance Standing balance-Leahy Scale: Fair Standing balance comment: static standing no UE support                             Pertinent Vitals/Pain Pain Assessment Pain Assessment: 0-10 Pain Score: 2  Pain Location: L knee Pain Descriptors / Indicators: Aching, Discomfort, Operative site guarding Pain Intervention(s): Limited activity within patient's tolerance, Monitored during session, Premedicated before session, Repositioned, Ice applied    Home Living Family/patient expects to be discharged to::  Private residence Living Arrangements: Spouse/significant other Available Help at Discharge: Family Type of Home: House Home Access: Stairs to enter Entrance Stairs-Rails: Doctor, general practice of Steps: 3   Home Layout: One level Home Equipment: None Additional Comments: RW delivered  and iceman machine    Prior Function Prior Level of Function : Independent/Modified Independent             Mobility Comments: IND with all ADLs, self care tasks, IADLs, driving no AD       Extremity/Trunk Assessment        Lower Extremity Assessment Lower Extremity Assessment: LLE deficits/detail LLE Deficits / Details: ankle DF/PF 5/5; SLR < 10 degree lag LLE Sensation: WNL    Cervical / Trunk Assessment Cervical / Trunk Assessment:  (wfl)  Communication   Communication Communication: No apparent difficulties  Cognition Arousal: Alert Behavior During Therapy: WFL for tasks assessed/performed Overall Cognitive Status: Within Functional Limits for tasks assessed                                          General Comments General comments (skin integrity, edema, etc.): spouse present    Exercises Total Joint Exercises Ankle Circles/Pumps: AROM, Both, 20 reps   Assessment/Plan    PT Assessment Patient needs continued PT services  PT Problem List Decreased strength;Decreased range of motion;Decreased activity tolerance;Decreased balance;Decreased mobility;Decreased coordination;Pain       PT Treatment Interventions DME instruction;Gait training;Stair training;Functional mobility training;Therapeutic activities;Therapeutic exercise;Balance training;Neuromuscular re-education;Patient/family education;Modalities    PT Goals (Current goals can be found in the Care Plan section)  Acute Rehab PT Goals Patient Stated Goal: to be able to run a 5 k PT Goal Formulation: With patient Time For Goal Achievement: 09/15/22 Potential to Achieve Goals: Good    Frequency 7X/week     Co-evaluation               AM-PAC PT "6 Clicks" Mobility  Outcome Measure Help needed turning from your back to your side while in a flat bed without using bedrails?: None Help needed moving from lying on your back to sitting on the side of a flat bed without using  bedrails?: A Little Help needed moving to and from a bed to a chair (including a wheelchair)?: A Little Help needed standing up from a chair using your arms (e.g., wheelchair or bedside chair)?: A Little Help needed to walk in hospital room?: A Little Help needed climbing 3-5 steps with a railing? : A Lot 6 Click Score: 18    End of Session Equipment Utilized During Treatment: Gait belt Activity Tolerance: No increased pain;Patient tolerated treatment well Patient left: in chair;with call bell/phone within reach;with family/visitor present Nurse Communication: Mobility status PT Visit Diagnosis: Unsteadiness on feet (R26.81);Other abnormalities of gait and mobility (R26.89);Muscle weakness (generalized) (M62.81);Difficulty in walking, not elsewhere classified (R26.2);Pain Pain - Right/Left: Left Pain - part of body: Knee;Leg    Time: 1324-4010 PT Time Calculation (min) (ACUTE ONLY): 33 min   Charges:   PT Evaluation $PT Eval Low Complexity: 1 Low PT Treatments $Gait Training: 8-22 mins PT General Charges $$ ACUTE PT VISIT: 1 Visit         Johnny Bridge, PT Acute Rehab   Jacqualyn Posey 09/01/2022, 3:27 PM

## 2022-09-02 ENCOUNTER — Other Ambulatory Visit: Payer: Self-pay | Admitting: Orthopaedic Surgery

## 2022-09-02 DIAGNOSIS — M1712 Unilateral primary osteoarthritis, left knee: Secondary | ICD-10-CM | POA: Diagnosis not present

## 2022-09-02 LAB — BASIC METABOLIC PANEL
Anion gap: 8 (ref 5–15)
BUN: 11 mg/dL (ref 8–23)
CO2: 24 mmol/L (ref 22–32)
Calcium: 7.8 mg/dL — ABNORMAL LOW (ref 8.9–10.3)
Chloride: 97 mmol/L — ABNORMAL LOW (ref 98–111)
Creatinine, Ser: 0.74 mg/dL (ref 0.61–1.24)
GFR, Estimated: >60 mL/min (ref >60)
Glucose, Bld: 134 mg/dL — ABNORMAL HIGH (ref 70–99)
Potassium: 3.9 mmol/L (ref 3.5–5.1)
Sodium: 129 mmol/L — ABNORMAL LOW (ref 135–145)

## 2022-09-02 LAB — CBC
HCT: 29.7 % — ABNORMAL LOW (ref 39.0–52.0)
Hemoglobin: 10 g/dL — ABNORMAL LOW (ref 13.0–17.0)
MCH: 31.9 pg (ref 26.0–34.0)
MCHC: 33.7 g/dL (ref 30.0–36.0)
MCV: 94.9 fL (ref 80.0–100.0)
Platelets: 257 10*3/uL (ref 150–400)
RBC: 3.13 MIL/uL — ABNORMAL LOW (ref 4.22–5.81)
RDW: 12.9 % (ref 11.5–15.5)
WBC: 10.2 10*3/uL (ref 4.0–10.5)
nRBC: 0 % (ref 0.0–0.2)

## 2022-09-02 MED ORDER — OXYCODONE HCL 5 MG PO TABS: 5.0000 mg | ORAL_TABLET | Freq: Four times a day (QID) | ORAL | 0 refills | Status: AC | PRN

## 2022-09-02 MED ORDER — ASPIRIN 81 MG PO CHEW: 81.0000 mg | CHEWABLE_TABLET | Freq: Two times a day (BID) | ORAL | 0 refills | Status: AC

## 2022-09-02 MED ORDER — TAMSULOSIN HCL 0.4 MG PO CAPS
0.4000 mg | ORAL_CAPSULE | Freq: Once | ORAL | Status: AC
  Administered 2022-09-02: 0.4 mg via ORAL
  Filled 2022-09-02: qty 1

## 2022-09-02 MED ORDER — TIZANIDINE HCL 2 MG PO TABS: 2.0000 mg | ORAL_TABLET | Freq: Four times a day (QID) | ORAL | 1 refills | Status: AC | PRN

## 2022-09-02 MED ORDER — OXYCODONE HCL 5 MG PO TABS: 5.0000 mg | ORAL_TABLET | Freq: Four times a day (QID) | ORAL | 0 refills | Status: DC | PRN

## 2022-09-02 NOTE — Discharge Summary (Signed)
Patient ID: Eddie Young MRN: [id number] DOB/AGE: 1945-03-24 77 y.o.  Admit date: 09/01/2022 Discharge date: 09/02/2022  Admission Diagnoses:  Principal Problem:   Status post total left knee replacement   Discharge Diagnoses:  Same  Past Medical History:  Diagnosis Date   Arthritis    GERD (gastroesophageal reflux disease)    Hyperlipidemia    Hypothyroidism    Macular degeneration    Thyroid disease    hyper active    Surgeries: Procedure(s): LEFT TOTAL KNEE ARTHROPLASTY on 09/01/2022   Consultants:   Discharged Condition: Improved  Hospital Course: Eddie Young is an 77 y.o. male who was admitted 09/01/2022 for operative treatment ofStatus post total left knee replacement. Patient has severe unremitting pain that affects sleep, daily activities, and work/hobbies. After pre-op clearance the patient was taken to the operating room on 09/01/2022 and underwent  Procedure(s): LEFT TOTAL KNEE ARTHROPLASTY.    Patient was given perioperative antibiotics:  Anti-infectives (From admission, onward)    Start     Dose/Rate Route Frequency Ordered Stop   09/01/22 1445  ceFAZolin (ANCEF) IVPB 1 g/50 mL premix        1 g 100 mL/hr over 30 Minutes Intravenous Every 6 hours 09/01/22 1353 09/01/22 2100   09/01/22 0630  ceFAZolin (ANCEF) IVPB 2g/100 mL premix        2 g 200 mL/hr over 30 Minutes Intravenous On call to O.R. 09/01/22 4098 09/01/22 1191        Patient was given sequential compression devices, early ambulation, and chemoprophylaxis to prevent DVT.  Patient benefited maximally from hospital stay and there were no complications.    Recent vital signs: Patient Vitals for the past 24 hrs:  BP Temp Temp src Pulse Resp SpO2  09/02/22 1603 (!) 160/67 98 F (36.7 C) Oral 69 20 97 %  09/02/22 1425 (!) 156/71 (!) 97.5 F (36.4 C) Oral 64 20 100 %  09/02/22 0200 138/74 97.6 F (36.4 C) -- 65 16 99 %  09/01/22 2144 (!) 159/63 (!) 96.8 F (36 C) -- 67 16 99 %      Recent laboratory studies:  Recent Labs    09/02/22 0337  WBC 10.2  HGB 10.0*  HCT 29.7*  PLT 257  NA 129*  K 3.9  CL 97*  CO2 24  BUN 11  CREATININE 0.74  GLUCOSE 134*  CALCIUM 7.8*     Discharge Medications:   Allergies as of 09/02/2022       Reactions   Penicillins Other (See Comments)   Unknown;reaction as child per mother        Medication List     STOP taking these medications    aspirin EC 81 MG tablet Replaced by: aspirin 81 MG chewable tablet       TAKE these medications    aspirin 81 MG chewable tablet Chew 1 tablet (81 mg total) by mouth 2 (two) times daily. Replaces: aspirin EC 81 MG tablet   calcium carbonate 600 MG Tabs tablet Commonly known as: OS-CAL Take 600 mg by mouth in the morning.   esomeprazole 40 MG capsule Commonly known as: NEXIUM Take 40 mg by mouth every other day. In the morning.   ferrous sulfate 325 (65 FE) MG tablet Take 325 mg by mouth every other day. In the morning.   Fish Oil 1200 MG Cpdr Take 1,200 mg by mouth every evening.   GLUCOSAMINE HCL PO Take 2,000 mg by mouth every evening. With Vitamin D3  methimazole 5 MG tablet Commonly known as: TAPAZOLE Take 5 mg by mouth every other day. In the evening.   oxyCODONE 5 MG immediate release tablet Commonly known as: Oxy IR/ROXICODONE Take 1-2 tablets (5-10 mg total) by mouth every 6 (six) hours as needed for moderate pain (pain score 4-6).   PRESERVISION AREDS 2 PO Take 1 tablet by mouth in the morning and at bedtime.   rosuvastatin 40 MG tablet Commonly known as: CRESTOR Take 20 mg by mouth every evening.   tiZANidine 2 MG tablet Commonly known as: ZANAFLEX Take 1 tablet (2 mg total) by mouth every 6 (six) hours as needed for muscle spasms.   VITAMIN B-12 PO Take 2,500 mcg by mouth in the morning.   vitamin E 180 MG (400 UNITS) capsule Take 400 Units by mouth every evening.               Durable Medical Equipment  (From admission,  onward)           Start     Ordered   09/01/22 1354  DME 3 n 1  Once        09/01/22 1353   09/01/22 1354  DME Walker rolling  Once       Question Answer Comment  Walker: With 5 Inch Wheels   Patient needs a walker to treat with the following condition Status post total left knee replacement      09/01/22 1353            Diagnostic Studies: DG Knee Left Port  Result Date: 09/01/2022 CLINICAL DATA:  Status post left knee replacement. EXAM: PORTABLE LEFT KNEE - 1-2 VIEW COMPARISON:  None Available. FINDINGS: Left knee arthroplasty in expected alignment. No periprosthetic lucency or fracture. There has been patellar resurfacing. Recent postsurgical change includes air and edema in the soft tissues and joint space. Anterior skin staples in place. IMPRESSION: Left knee arthroplasty without immediate postoperative complication. Electronically Signed   By: Narda Rutherford M.D.   On: 09/01/2022 12:12    Disposition: Discharge disposition: 01-Home or Self Care          Follow-up Information     Kathryne Hitch, MD Follow up in 2 week(s).   Specialty: Orthopedic Surgery Contact information: [address] [place] [address] [phone]                  Signed: Kathryne Hitch 09/02/2022, 7:09 PM

## 2022-09-02 NOTE — Progress Notes (Signed)
Physical Therapy Treatment Patient Details Name: Eddie Young MRN: [id number] DOB: 05-12-1945 Today's Date: 09/02/2022   History of Present Illness 77 yo male presents to therapy s/p L TKA on 09/01/2022 due to failure of conservative measures. 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    PT Comments  Pt ambulated 120' with RW, no loss of balance. Stair training completed, pt demonstrates good understanding of HEP. He is ready to DC home from a PT standpoint.      If plan is discharge home, recommend the following: A little help with walking and/or transfers;A little help with bathing/dressing/bathroom;Assistance with cooking/housework;Help with stairs or ramp for entrance;Assist for transportation   Can travel by private vehicle        Equipment Recommendations  Rolling walker (2 wheels)    Recommendations for Other Services       Precautions / Restrictions Precautions Precautions: Fall;Knee Precaution Booklet Issued: Yes (comment) Precaution Comments: reviewed no pillow under knee Restrictions Weight Bearing Restrictions: No     Mobility  Bed Mobility Overal bed mobility: Needs Assistance Bed Mobility: Supine to Sit     Supine to sit: HOB elevated, Contact guard     General bed mobility comments: up in recliner    Transfers Overall transfer level: Needs assistance Equipment used: Rolling walker (2 wheels) Transfers: Sit to/from Stand Sit to Stand: Contact guard assist, From elevated surface           General transfer comment: VCs hand placement, increased time    Ambulation/Gait Ambulation/Gait assistance: Contact guard assist Gait Distance (Feet): 120 Feet Assistive device: Rolling walker (2 wheels) Gait Pattern/deviations: Step-to pattern, Antalgic, Trunk flexed Gait velocity: decreased     General Gait  Details: VCs sequencing and positioning in RW   Stairs Stairs: Yes Stairs assistance: Contact guard assist Stair Management: One rail Left, Step to pattern, Sideways Number of Stairs: 3 General stair comments: VCs sequencing, spouse present   Wheelchair Mobility     Tilt Bed    Modified Rankin (Stroke Patients Only)       Balance Overall balance assessment: Needs assistance Sitting-balance support: Feet supported Sitting balance-Leahy Scale: Good     Standing balance support: Bilateral upper extremity supported, During functional activity, Reliant on assistive device for balance Standing balance-Leahy Scale: Fair                              Cognition Arousal: Alert Behavior During Therapy: WFL for tasks assessed/performed Overall Cognitive Status: Within Functional Limits for tasks assessed                                          Exercises Total Joint Exercises Ankle Circles/Pumps: AROM, Both, 20 reps Quad Sets: AROM, Both, 5 reps, Supine Short Arc Quad: AROM, Left, 5 reps, Supine Heel Slides: AAROM, Left, 5 reps, Supine Hip ABduction/ADduction: AAROM, Left, 5 reps, Supine Straight Leg Raises: AAROM, Left, 5 reps, Supine Long Arc Quad: AROM, Left, 5 reps, Seated Knee Flexion: AAROM, Left, Seated, 10 reps Goniometric ROM: 5-45* AAROM L knee    General Comments        Pertinent Vitals/Pain Pain Assessment Pain Score: 3  Pain Location: L knee Pain Descriptors / Indicators: Aching,  Discomfort, Operative site guarding Pain Intervention(s): Limited activity within patient's tolerance, Monitored during session, Premedicated before session, Ice applied    Home Living                          Prior Function            PT Goals (current goals can now be found in the care plan section) Acute Rehab PT Goals Patient Stated Goal: walk in the neighborhood PT Goal Formulation: With patient/family Time For Goal  Achievement: 09/15/22 Potential to Achieve Goals: Good Progress towards PT goals: Progressing toward goals    Frequency    7X/week      PT Plan      Co-evaluation              AM-PAC PT "6 Clicks" Mobility   Outcome Measure  Help needed turning from your back to your side while in a flat bed without using bedrails?: None Help needed moving from lying on your back to sitting on the side of a flat bed without using bedrails?: A Little Help needed moving to and from a bed to a chair (including a wheelchair)?: A Little Help needed standing up from a chair using your arms (e.g., wheelchair or bedside chair)?: A Little Help needed to walk in hospital room?: A Little Help needed climbing 3-5 steps with a railing? : A Little 6 Click Score: 19    End of Session Equipment Utilized During Treatment: Gait belt Activity Tolerance: No increased pain;Patient tolerated treatment well Patient left: in chair;with call bell/phone within reach;with family/visitor present Nurse Communication: Mobility status PT Visit Diagnosis: Unsteadiness on feet (R26.81);Other abnormalities of gait and mobility (R26.89);Muscle weakness (generalized) (M62.81);Difficulty in walking, not elsewhere classified (R26.2);Pain Pain - Right/Left: Left Pain - part of body: Knee     Time: 0918-1002 PT Time Calculation (min) (ACUTE ONLY): 44 min  Charges:    $Gait Training: 8-22 mins $Therapeutic Exercise: 8-22 mins $Therapeutic Activity: 8-22 mins PT General Charges $$ ACUTE PT VISIT: 1 Visit                     Tamala Ser PT 09/02/2022  Acute Rehabilitation Services  Office [phone]

## 2022-09-02 NOTE — Plan of Care (Signed)
  Problem: Education: Goal: Knowledge of the prescribed therapeutic regimen will improve Outcome: Progressing   Problem: Activity: Goal: Ability to avoid complications of mobility impairment will improve Outcome: Adequate for Discharge Goal: Range of joint motion will improve Outcome: Adequate for Discharge   Problem: Clinical Measurements: Goal: Postoperative complications will be avoided or minimized Outcome: Progressing   Problem: Pain Management: Goal: Pain level will decrease with appropriate interventions Outcome: Progressing   Problem: Skin Integrity: Goal: Will show signs of wound healing Outcome: Progressing   Problem: Health Behavior/Discharge Planning: Goal: Ability to manage health-related needs will improve Outcome: Progressing   Problem: Clinical Measurements: Goal: Ability to maintain clinical measurements within normal limits will improve Outcome: Progressing Goal: Will remain free from infection Outcome: Progressing Goal: Diagnostic test results will improve Outcome: Progressing Goal: Respiratory complications will improve Outcome: Progressing Goal: Cardiovascular complication will be avoided Outcome: Progressing   Problem: Activity: Goal: Risk for activity intolerance will decrease Outcome: Adequate for Discharge   Problem: Nutrition: Goal: Adequate nutrition will be maintained Outcome: Completed/Met   Problem: Elimination: Goal: Will not experience complications related to bowel motility Outcome: Progressing Goal: Will not experience complications related to urinary retention Outcome: Progressing   Problem: Pain Managment: Goal: General experience of comfort will improve Outcome: Progressing   Problem: Safety: Goal: Ability to remain free from injury will improve Outcome: Progressing

## 2022-09-02 NOTE — Progress Notes (Signed)
Patient voided 150cc urine, post void bladder scan done 30 minutes after with 205cc showing, pt is not distended and ready for discharge. Wife and patient involved in discharge instructions questions asked and answered. Sharrell Ku RN

## 2022-09-02 NOTE — Progress Notes (Signed)
Per MD order In and out cath done 250cc out.

## 2022-09-02 NOTE — Progress Notes (Signed)
Subjective: 1 Day Post-Op Procedure(s) (LRB): LEFT TOTAL KNEE ARTHROPLASTY (Left) Patient reports pain as moderate.  He has not urinated since the Foley was removed earlier this morning.  A bladder scan did show 700 cc of urine.  He is not uncomfortable right now.  Objective: Vital signs in last 24 hours: Temp:  [96.8 F (36 C)-98 F (36.7 C)] 97.6 F (36.4 C) (08/24 0200) Pulse Rate:  [37-67] 65 (08/24 0200) Resp:  [11-20] 16 (08/24 0200) BP: (138-176)/(63-102) 138/74 (08/24 0200) SpO2:  [95 %-100 %] 99 % (08/24 0200)  Intake/Output from previous day: 08/23 0701 - 08/24 0700 In: 2330 [P.O.:540; I.V.:1690; IV Piggyback:100] Out: 2350 [Urine:2300; Blood:50] Intake/Output this shift: No intake/output data recorded.  Recent Labs    09/02/22 0337  HGB 10.0*   Recent Labs    09/02/22 0337  WBC 10.2  RBC 3.13*  HCT 29.7*  PLT 257   Recent Labs    09/02/22 0337  NA 129*  K 3.9  CL 97*  CO2 24  BUN 11  CREATININE 0.74  GLUCOSE 134*  CALCIUM 7.8*   No results for input(s): "LABPT", "INR" in the last 72 hours.  Sensation intact distally Intact pulses distally Dorsiflexion/Plantar flexion intact Incision: dressing C/D/I Compartment soft   Assessment/Plan: 1 Day Post-Op Procedure(s) (LRB): LEFT TOTAL KNEE ARTHROPLASTY (Left) Up with therapy Discharge home with home health  I have instructed nursing to wait at least 1 more hour before considering an In-N-Out catheterization.  The patient does understand that we need to have him urinating prior to discharge to home.  I will also order some Flomax.  Hopefully he will be able to be discharged home later today.    Kathryne Hitch 09/02/2022, 11:48 AM

## 2022-09-02 NOTE — Discharge Instructions (Signed)

## 2022-09-04 ENCOUNTER — Encounter (HOSPITAL_COMMUNITY): Payer: Self-pay | Admitting: Orthopaedic Surgery

## 2022-09-07 ENCOUNTER — Ambulatory Visit (HOSPITAL_COMMUNITY)
Admission: RE | Admit: 2022-09-07 | Discharge: 2022-09-07 | Disposition: A | Discharge status: Home / Self Care | Payer: Medicare HMO | Source: Ambulatory Visit | Attending: Orthopaedic Surgery | Admitting: Orthopaedic Surgery

## 2022-09-07 ENCOUNTER — Telehealth: Payer: Self-pay | Admitting: Orthopaedic Surgery

## 2022-09-07 ENCOUNTER — Other Ambulatory Visit: Payer: Self-pay | Admitting: Orthopaedic Surgery

## 2022-09-07 ENCOUNTER — Encounter: Payer: Self-pay | Admitting: Orthopaedic Surgery

## 2022-09-07 DIAGNOSIS — Z96652 Presence of left artificial knee joint: Secondary | ICD-10-CM

## 2022-09-07 NOTE — Progress Notes (Signed)
Lower extremity venous duplex completed. Please see CV Procedures for preliminary results.  Shona Simpson, RVT 09/07/22 9:34 AM

## 2022-09-07 NOTE — Telephone Encounter (Signed)
Patients wife called in stating that pt is s/p left TKA on 09/01/22, having tenderness and pain in left calf, red, and hot to the touch. I told Dr. August Saucer whom is in the office at this time and got his approval to put in a venous doppler order to r/o DVT. Patient is taking two chewable ASA daily since surgery. I have put in a stat order.

## 2022-09-08 ENCOUNTER — Other Ambulatory Visit: Payer: Self-pay | Admitting: Orthopaedic Surgery

## 2022-09-08 MED ORDER — TRAMADOL HCL 50 MG PO TABS: 50.0000 mg | ORAL_TABLET | Freq: Four times a day (QID) | ORAL | 0 refills | Status: DC | PRN

## 2022-09-14 ENCOUNTER — Other Ambulatory Visit: Payer: Self-pay

## 2022-09-14 ENCOUNTER — Encounter: Payer: Self-pay | Admitting: Orthopaedic Surgery

## 2022-09-14 ENCOUNTER — Ambulatory Visit (INDEPENDENT_AMBULATORY_CARE_PROVIDER_SITE_OTHER): Payer: Medicare HMO | Admitting: Orthopaedic Surgery

## 2022-09-14 DIAGNOSIS — Z96652 Presence of left artificial knee joint: Secondary | ICD-10-CM

## 2022-09-14 MED ORDER — TRAMADOL HCL 50 MG PO TABS: 50.0000 mg | ORAL_TABLET | Freq: Four times a day (QID) | ORAL | 0 refills | Status: DC | PRN

## 2022-09-14 NOTE — Progress Notes (Signed)
The patient is here for his first postoperative visit 2 weeks status post a left total knee replacement.  He is only 77 years old.  He is having difficulty with calf pain at first but that is now resolved.  He is only taking tramadol and Zanaflex.  He does have therapy coming to his house.  Has been compliant with a baby aspirin twice a day and he has been wearing compressive hose.  On exam his incision looks good.  The staples have been removed and Steri-Strips have been applied.  His calf is soft.  His motor exam is intact with his left foot.  He lacks full extension by about 5 degrees and I can only flex him to maybe 70 degrees.  He understands that he has to push this hard.  It is essential we get him set up for outpatient physical therapy as soon as possible.  From my standpoint we will see him back in a month to see how he is doing overall.  He can go back down to just 1 aspirin a day that he was on prior to surgery.  I will send in some more tramadol as well.

## 2022-09-18 ENCOUNTER — Other Ambulatory Visit: Payer: Self-pay

## 2022-09-18 ENCOUNTER — Encounter: Payer: Self-pay | Admitting: Physical Therapy

## 2022-09-18 ENCOUNTER — Ambulatory Visit (INDEPENDENT_AMBULATORY_CARE_PROVIDER_SITE_OTHER): Payer: Medicare HMO | Admitting: Physical Therapy

## 2022-09-18 DIAGNOSIS — M25562 Pain in left knee: Secondary | ICD-10-CM | POA: Diagnosis not present

## 2022-09-18 DIAGNOSIS — R600 Localized edema: Secondary | ICD-10-CM | POA: Diagnosis not present

## 2022-09-18 DIAGNOSIS — M25662 Stiffness of left knee, not elsewhere classified: Secondary | ICD-10-CM

## 2022-09-18 DIAGNOSIS — R262 Difficulty in walking, not elsewhere classified: Secondary | ICD-10-CM | POA: Diagnosis not present

## 2022-09-18 NOTE — Therapy (Signed)
OUTPATIENT PHYSICAL THERAPY LOWER EXTREMITY EVALUATION   Patient Name: Eddie Young MRN: [id number] DOB:1945/02/18, 77 y.o., male Today's Date: 09/18/2022  END OF SESSION:  PT End of Session - 09/18/22 1147     Visit Number 1    Number of Visits 24    Date for PT Re-Evaluation 12/15/22    Progress Note Due on Visit 10    PT Start Time 1147    PT Stop Time 1230    PT Time Calculation (min) 43 min    Activity Tolerance No increased pain;Patient tolerated treatment well    Behavior During Therapy WFL for tasks assessed/performed             Past Medical History:  Diagnosis Date   Arthritis    GERD (gastroesophageal reflux disease)    Hyperlipidemia    Hypothyroidism    Macular degeneration    Thyroid disease    hyper active   Past Surgical History:  Procedure Laterality Date   ANKLE ARTHROSCOPY WITH REPAIR SUBLUXING TENDON     KNEE CARTILAGE SURGERY     SHOULDER ARTHROSCOPY W/ ROTATOR CUFF REPAIR     TOTAL KNEE ARTHROPLASTY Left 09/01/2022   Procedure: LEFT TOTAL KNEE ARTHROPLASTY;  Surgeon: Kathryne Hitch, MD;  Location: [facility];  Service: Orthopedics;  Laterality: Left;   Patient Active Problem List   Diagnosis Date Noted   Status post total left knee replacement 09/01/2022   Unilateral primary osteoarthritis, right knee 06/21/2022   Unilateral primary osteoarthritis, left knee 09/13/2021   AC (acromioclavicular) arthritis 08/26/2019   Impingement syndrome of left shoulder 08/26/2019   Biceps tendonitis on left 08/26/2019   Pain in left shoulder 10/28/2018    PCP: Gwenlyn Found, MD   REFERRING PROVIDER: Kathryne Hitch*   REFERRING DIAG:  Diagnosis  [phone] (ICD-10-CM) - Status post total left knee replacement    THERAPY DIAG:  Acute pain of left knee  Stiffness of left knee, not elsewhere classified  Difficulty in walking, not elsewhere classified  Localized edema  Rationale for Evaluation and Treatment:  Rehabilitation  ONSET DATE: left TKA on 09/01/22  SUBJECTIVE:   SUBJECTIVE STATEMENT: Pt arriving to therapy s/p left TKA on 09/01/22. Pt amb with rolling walker. Pt states he has been using his home ice with elevation. Pt reporting 5 therapy visits. Pt reporting pain of 1/10 at rest. Worst pain with movements is 5-6/10.   PERTINENT HISTORY: Arthritis, GERD, thyroid disease, macular degeneration, hyperlipidemia, ankle arthroscopy, knee cartilage surgery, shoulder arthroscopy with rotator cuff repair, TKA on left 09/01/2022   PAIN:  NPRS scale: 5-6/10 at worse, 1/10 at rest Pain location: left knee Pain description: achy Aggravating factors: exercises, walking Relieving factors: resting, muscle relaxer, pain meds, ice  PRECAUTIONS: None  WEIGHT BEARING RESTRICTIONS: No WBAT  FALLS:  Has patient fallen in last 6 months? Yes, tripped over rope in yard while he was having work done.   LIVING ENVIRONMENT: Lives with: lives with their family and lives with their spouse Lives in: House/apartment Stairs: Yes: External: 3 steps; bilateral but cannot reach both Has following equipment at home: Dan Humphreys - 2 wheeled  OCCUPATION: retired  PLOF: Independent  PATIENT GOALS: walk without device  Next MD visit:   OBJECTIVE:   DIAGNOSTIC FINDINGS: IMPRESSION: Left knee arthroplasty without immediate postoperative complication.  PATIENT SURVEYS:  09/18/22  FOTO intake:    39%   COGNITION: Overall cognitive status: WFL    SENSATION: 09/18/22 WFL  EDEMA:  09/18/22 Circumferential:  Rt         LOWER EXTREMITY ROM:   ROM Right 09/18/22 supine Left 09/18/22 supine  Hip flexion    Hip extension    Hip abduction    Hip adduction    Hip internal rotation    Hip external rotation    Knee flexion A: 124 A: 72 P: 75  Knee extension A: 0 A: -15 P: -12  Ankle dorsiflexion    Ankle plantarflexion    Ankle inversion    Ankle eversion     (Blank rows = not tested)  LOWER  EXTREMITY MMT:  MMT Right 09/18/22 Left 09/18/22  Hip flexion 5 4  Hip extension    Hip abduction    Hip adduction    Hip internal rotation    Hip external rotation    Knee flexion 5 3  Knee extension 5 3  Ankle dorsiflexion    Ankle plantarflexion    Ankle inversion    Ankle eversion     (Blank rows = not tested)    FUNCTIONAL TESTS:  09/18/22: 5 time sit to stand: 21 seconds with UE support  GAIT: Distance walked: 30 feet clinic distance, level surface Assistive device utilized: Walker - 2 wheeled Level of assistance: Modified independence Comments: increased knee flexion on the left                                                                                                                                                                        TODAY'S TREATMENT                                                                          DATE: Therex:    HEP instruction/performance c cues for techniques, handout provided.  Trial set performed of each for comprehension and symptom assessment.  See below for exercise list  PATIENT EDUCATION:  Education details: HEP, POC Person educated: Patient Education method: Explanation, Demonstration, Verbal cues, and Handouts Education comprehension: verbalized understanding, returned demonstration, and verbal cues required  HOME EXERCISE PROGRAM: Access Code: [email or web address] URL: [email or web address] Date: 09/18/2022 Prepared by: Narda Amber  Exercises - Long Sitting Quad Set with Towel Roll Under Heel  - 3 x daily - 7 x weekly - 2 sets - 10 reps - 5 seconds hold - Supine Active Straight Leg Raise  - 3 x daily - 7 x weekly - 2 sets - 10 reps - Supine Heel Slide with Strap  -  3 x daily - 7 x weekly - 10 reps - 5 seconds hold - Sit to Stand with Armchair  - 3 x daily - 7 x weekly - 2 sets - 10 reps - Seated Heel Slide  - 3 x daily - 7 x weekly - 2 sets - 10 reps  ASSESSMENT:  CLINICAL IMPRESSION: Patient is a 77  y.o. who comes to clinic with complaints of left knee pain s/p left TKA on 09/01/22. Pt presents with mobility, strength and movement coordination deficits that impair their ability to perform usual daily and recreational functional activities without increase difficulty/symptoms at this time.  Patient to benefit from skilled PT services to address impairments and limitations to improve to previous level of function without restriction secondary to condition.   OBJECTIVE IMPAIRMENTS: decreased activity tolerance, decreased balance, decreased mobility, difficulty walking, decreased ROM, decreased strength, increased edema, and pain.   ACTIVITY LIMITATIONS: lifting, bending, sitting, standing, squatting, stairs, transfers, bed mobility, and dressing  PARTICIPATION LIMITATIONS: meal prep, cleaning, laundry, driving, and community activity  PERSONAL FACTORS: 3+ comorbidities: see history above  are also affecting patient's functional outcome.   REHAB POTENTIAL: Good  CLINICAL DECISION MAKING: Stable/uncomplicated  EVALUATION COMPLEXITY: Low   GOALS: Goals reviewed with patient? Yes  SHORT TERM GOALS: (target date for Short term goals are 3 weeks 10/13/22)   1.  Patient will demonstrate independent use of home exercise program to maintain progress from in clinic treatments.  Goal status: New  LONG TERM GOALS: (target dates for all long term goals are 12 weeks  12/15/22 )   1. Patient will demonstrate/report pain at worst less than or equal to 2/10 to facilitate minimal limitation in daily activity secondary to pain symptoms.  Goal status: New   2. Patient will demonstrate independent use of home exercise program to facilitate ability to maintain/progress functional gains from skilled physical therapy services.  Goal status: New   3. Patient will demonstrate FOTO outcome > or = 60 % to indicate reduced disability due to condition.  Goal status: New   4.  Patient will demonstrate left  LE MMT 5/5 throughout to faciltiate usual transfers, stairs, squatting at [facility] for daily life.   Goal status: New   5.  Patient will demonstrate 5 time sit to stand: </= 13 seconds with no UE support Goal status: New   6.  Pt will be able to navigate 4-5 steps with single hand rail with step over step gait pattern.  Goal status: New  7. Pt will be able to perform left knee active ROM from 4 to 115 degrees for improved functional mobility.       PLAN:  PT FREQUENCY: 1-2x/week  PT DURATION: 10 weeks  PLANNED INTERVENTIONS: Therapeutic exercises, Therapeutic activity, Neuro Muscular re-education, Balance training, Gait training, Patient/Family education, Joint mobilization, Stair training, DME instructions, Dry Needling, Electrical stimulation, Traction, Cryotherapy, vasopneumatic deviceMoist heat, Taping, Ultrasound, Ionotophoresis 4mg /ml Dexamethasone, and aquatic therapy, Manual therapy.  All included unless contraindicated  PLAN FOR NEXT SESSION: Review HEP knowledge/results, left quad strengthening, left knee ROM, vasopneumatic      Sharmon Leyden, PT, MPT 09/18/2022, 11:48 AM

## 2022-09-21 ENCOUNTER — Ambulatory Visit: Payer: Medicare HMO | Admitting: Rehabilitative and Restorative Service Providers"

## 2022-09-21 ENCOUNTER — Encounter: Payer: Self-pay | Admitting: Rehabilitative and Restorative Service Providers"

## 2022-09-21 DIAGNOSIS — M25562 Pain in left knee: Secondary | ICD-10-CM

## 2022-09-21 DIAGNOSIS — M25662 Stiffness of left knee, not elsewhere classified: Secondary | ICD-10-CM

## 2022-09-21 DIAGNOSIS — R262 Difficulty in walking, not elsewhere classified: Secondary | ICD-10-CM

## 2022-09-21 DIAGNOSIS — R600 Localized edema: Secondary | ICD-10-CM

## 2022-09-21 NOTE — Therapy (Signed)
OUTPATIENT PHYSICAL THERAPY LOWER EXTREMITY TREATMENT   Patient Name: Eddie Young MRN: [id number] DOB:08-Dec-1945, 77 y.o., male Today's Date: 09/21/2022  END OF SESSION:  PT End of Session - 09/21/22 1156     Visit Number 2    Number of Visits 24    Date for PT Re-Evaluation 12/15/22    Progress Note Due on Visit 10    PT Start Time 1150    PT Stop Time 1240    PT Time Calculation (min) 50 min    Activity Tolerance No increased pain;Patient tolerated treatment well;Patient limited by pain    Behavior During Therapy [facility] for tasks assessed/performed              Past Medical History:  Diagnosis Date   Arthritis    GERD (gastroesophageal reflux disease)    Hyperlipidemia    Hypothyroidism    Macular degeneration    Thyroid disease    hyper active   Past Surgical History:  Procedure Laterality Date   ANKLE ARTHROSCOPY WITH REPAIR SUBLUXING TENDON     KNEE CARTILAGE SURGERY     SHOULDER ARTHROSCOPY W/ ROTATOR CUFF REPAIR     TOTAL KNEE ARTHROPLASTY Left 09/01/2022   Procedure: LEFT TOTAL KNEE ARTHROPLASTY;  Surgeon: Kathryne Hitch, MD;  Location: [facility];  Service: Orthopedics;  Laterality: Left;   Patient Active Problem List   Diagnosis Date Noted   Status post total left knee replacement 09/01/2022   Unilateral primary osteoarthritis, right knee 06/21/2022   Unilateral primary osteoarthritis, left knee 09/13/2021   AC (acromioclavicular) arthritis 08/26/2019   Impingement syndrome of left shoulder 08/26/2019   Biceps tendonitis on left 08/26/2019   Pain in left shoulder 10/28/2018    PCP: Gwenlyn Found, MD   REFERRING PROVIDER: Kathryne Hitch*   REFERRING DIAG:  Diagnosis  [phone] (ICD-10-CM) - Status post total left knee replacement    THERAPY DIAG:  Acute pain of left knee  Stiffness of left knee, not elsewhere classified  Difficulty in walking, not elsewhere classified  Localized edema  Rationale for Evaluation and  Treatment: Rehabilitation  ONSET DATE: left TKA on 09/01/22  SUBJECTIVE:   SUBJECTIVE STATEMENT: Gabreil reports sleeping relatively normal with his pain meds (tylenol and muscle relaxer).  He reports good early HEP compliance.   Pt arriving to therapy s/p left TKA on 09/01/22. Pt amb with rolling walker. Pt states he has been using his home ice with elevation. Pt reporting 5 therapy visits. Pt reporting pain of 1/10 at rest. Worst pain with movements is 5-6/10.   PERTINENT HISTORY: Arthritis, GERD, thyroid disease, macular degeneration, hyperlipidemia, ankle arthroscopy, knee cartilage surgery, shoulder arthroscopy with rotator cuff repair, TKA on left 09/01/2022   PAIN:  NPRS scale: 0-5/10 this week Pain location: left knee Pain description: achy Aggravating factors: exercises, walking Relieving factors: resting, muscle relaxer, pain meds, ice  PRECAUTIONS: None  WEIGHT BEARING RESTRICTIONS: No WBAT  FALLS:  Has patient fallen in last 6 months? Yes, tripped over rope in yard while he was having work done.   LIVING ENVIRONMENT: Lives with: lives with their family and lives with their spouse Lives in: House/apartment Stairs: Yes: External: 3 steps; bilateral but cannot reach both Has following equipment at home: Dan Humphreys - 2 wheeled  OCCUPATION: retired  PLOF: Independent  PATIENT GOALS: walk without device  Next MD visit:   OBJECTIVE:   DIAGNOSTIC FINDINGS: IMPRESSION: Left knee arthroplasty without immediate postoperative complication.  PATIENT SURVEYS:  09/18/22  FOTO  intake:    39%   COGNITION: Overall cognitive status: WFL    SENSATION: 09/18/22 WFL  EDEMA:  09/18/22 Circumferential: Rt         LOWER EXTREMITY ROM:   ROM Right 09/18/22 supine Left 09/18/22 supine Left 09/22/2022  Hip flexion     Hip extension     Hip abduction     Hip adduction     Hip internal rotation     Hip external rotation     Knee flexion A: 124 A: 72 P: 75 With belt 82   Knee extension A: 0 A: -15 P: -12 Active -15  Ankle dorsiflexion     Ankle plantarflexion     Ankle inversion     Ankle eversion      (Blank rows = not tested)  LOWER EXTREMITY MMT:  MMT Right 09/18/22 Left 09/18/22  Hip flexion 5 4  Hip extension    Hip abduction    Hip adduction    Hip internal rotation    Hip external rotation    Knee flexion 5 3  Knee extension 5 3  Ankle dorsiflexion    Ankle plantarflexion    Ankle inversion    Ankle eversion     (Blank rows = not tested)    FUNCTIONAL TESTS:  09/18/22: 5 time sit to stand: 21 seconds with UE support  GAIT: Distance walked: 30 feet clinic distance, level surface Assistive device utilized: Environmental consultant - 2 wheeled Level of assistance: Modified independence Comments: increased knee flexion on the left                                                                                                                                                                        TODAY'S TREATMENT                                                                          DATE: 09/21/2022 Recumbent bike Seat 10 AAROM for 5 minutes Quadriceps sets with heel prop 2 sets of 10 for 5 seconds Supine knee flexion with belt 5 x 5 seconds Tailgate knee flexion 1 minute Seated AAROM knee flexion 5 x 10 seconds Seated straight leg raises 3 sets of 5 for 3 seconds (pause before lifting 3-4 inches)  Functional Activities: Double Leg Press 75# 10 x slow eccentrics  Single Leg Press Left only 37# 10 x slow eccentrics  Vaso left knee for 10 minutes Medium 34*   09/18/2022  Therex:    HEP instruction/performance c cues for techniques, handout provided.  Trial set performed of each for comprehension and symptom assessment.  See below for exercise list  PATIENT EDUCATION:  Education details: HEP, POC Person educated: Patient Education method: Explanation, Demonstration, Verbal cues, and Handouts Education comprehension: verbalized understanding,  returned demonstration, and verbal cues required  HOME EXERCISE PROGRAM: Access Code: [email or web address] URL: [email or web address] Date: 09/18/2022 Prepared by: Narda Amber  Exercises - Long Sitting Quad Set with Towel Roll Under Heel  - 3 x daily - 7 x weekly - 2 sets - 10 reps - 5 seconds hold - Supine Active Straight Leg Raise  - 3 x daily - 7 x weekly - 2 sets - 10 reps - Supine Heel Slide with Strap  - 3 x daily - 7 x weekly - 10 reps - 5 seconds hold - Sit to Stand with Armchair  - 3 x daily - 7 x weekly - 2 sets - 10 reps - Seated Heel Slide  - 3 x daily - 7 x weekly - 2 sets - 10 reps  ASSESSMENT:  CLINICAL IMPRESSION: 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  AROM, edema control and quadriceps strength will be the early emphasis with his home and clinic program as AROM is now 15 - 0 - 82 degrees.  Continue current POC to meet long-term goals.   OBJECTIVE IMPAIRMENTS: decreased activity tolerance, decreased balance, decreased mobility, difficulty walking, decreased ROM, decreased strength, increased edema, and pain.   ACTIVITY LIMITATIONS: lifting, bending, sitting, standing, squatting, stairs, transfers, bed mobility, and dressing  PARTICIPATION LIMITATIONS: meal prep, cleaning, laundry, driving, and community activity  PERSONAL FACTORS: 3+ comorbidities: see history above  are also affecting patient's functional outcome.   REHAB POTENTIAL: Good  CLINICAL DECISION MAKING: Stable/uncomplicated  EVALUATION COMPLEXITY: Low   GOALS: Goals reviewed with patient? Yes  SHORT TERM GOALS: (target date for Short term goals are 3 weeks 10/13/22)   1.  Patient will demonstrate independent use of home exercise program to maintain progress from in clinic treatments.  Goal status: On Going 09/21/2022  LONG TERM GOALS: (target dates for all long term goals are 12 weeks  12/15/22 )   1. Patient will demonstrate/report pain at worst less than or equal to 2/10 to facilitate  minimal limitation in daily activity secondary to pain symptoms.  Goal status: New   2. Patient will demonstrate independent use of home exercise program to facilitate ability to maintain/progress functional gains from skilled physical therapy services.  Goal status: New   3. Patient will demonstrate FOTO outcome > or = 60 % to indicate reduced disability due to condition.  Goal status: New   4.  Patient will demonstrate left LE MMT 5/5 throughout to faciltiate usual transfers, stairs, squatting at [facility] for daily life.   Goal status: New   5.  Patient will demonstrate 5 time sit to stand: </= 13 seconds with no UE support Goal status: New   6.  Pt will be able to navigate 4-5 steps with single hand rail with step over step gait pattern.  Goal status: New  7. Pt will be able to perform left knee active ROM from 4 to 115 degrees for improved functional mobility.       PLAN:  PT FREQUENCY: 1-2x/week  PT DURATION: 10 weeks  PLANNED INTERVENTIONS: Therapeutic exercises, Therapeutic activity, Neuro Muscular re-education, Balance training, Gait training, Patient/Family education, Joint mobilization, Stair training, DME instructions, Dry Needling, Electrical stimulation, Traction,  Cryotherapy, vasopneumatic deviceMoist heat, Taping, Ultrasound, Ionotophoresis 4mg /ml Dexamethasone, and aquatic therapy, Manual therapy.  All included unless contraindicated  PLAN FOR NEXT SESSION: Review HEP knowledge/results, left quadriceps strengthening, left knee ROM, vasopneumatic for edema control     Cherlyn Cushing, PT, MPT 09/21/2022, 12:35 PM

## 2022-09-22 ENCOUNTER — Encounter: Payer: Self-pay | Admitting: Rehabilitative and Restorative Service Providers"

## 2022-09-22 ENCOUNTER — Ambulatory Visit: Payer: Medicare HMO | Admitting: Rehabilitative and Restorative Service Providers"

## 2022-09-22 ENCOUNTER — Other Ambulatory Visit: Payer: Self-pay | Admitting: Orthopaedic Surgery

## 2022-09-22 DIAGNOSIS — M25662 Stiffness of left knee, not elsewhere classified: Secondary | ICD-10-CM

## 2022-09-22 DIAGNOSIS — R262 Difficulty in walking, not elsewhere classified: Secondary | ICD-10-CM

## 2022-09-22 DIAGNOSIS — M25562 Pain in left knee: Secondary | ICD-10-CM

## 2022-09-22 DIAGNOSIS — R600 Localized edema: Secondary | ICD-10-CM | POA: Diagnosis not present

## 2022-09-22 NOTE — Therapy (Signed)
OUTPATIENT PHYSICAL THERAPY LOWER EXTREMITY TREATMENT   Patient Name: Eddie Young MRN: [id number] DOB:06/04/1945, 77 y.o., male Today's Date: 09/22/2022  END OF SESSION:  PT End of Session - 09/22/22 1145     Visit Number 3    Number of Visits 24    Date for PT Re-Evaluation 12/15/22    Progress Note Due on Visit 10    PT Start Time 1145    PT Stop Time 1237    PT Time Calculation (min) 52 min    Activity Tolerance No increased pain;Patient tolerated treatment well    Behavior During Therapy WFL for tasks assessed/performed               Past Medical History:  Diagnosis Date   Arthritis    GERD (gastroesophageal reflux disease)    Hyperlipidemia    Hypothyroidism    Macular degeneration    Thyroid disease    hyper active   Past Surgical History:  Procedure Laterality Date   ANKLE ARTHROSCOPY WITH REPAIR SUBLUXING TENDON     KNEE CARTILAGE SURGERY     SHOULDER ARTHROSCOPY W/ ROTATOR CUFF REPAIR     TOTAL KNEE ARTHROPLASTY Left 09/01/2022   Procedure: LEFT TOTAL KNEE ARTHROPLASTY;  Surgeon: Kathryne Hitch, MD;  Location: [facility];  Service: Orthopedics;  Laterality: Left;   Patient Active Problem List   Diagnosis Date Noted   Status post total left knee replacement 09/01/2022   Unilateral primary osteoarthritis, right knee 06/21/2022   Unilateral primary osteoarthritis, left knee 09/13/2021   AC (acromioclavicular) arthritis 08/26/2019   Impingement syndrome of left shoulder 08/26/2019   Biceps tendonitis on left 08/26/2019   Pain in left shoulder 10/28/2018    PCP: Gwenlyn Found, MD   REFERRING PROVIDER: Kathryne Hitch*   REFERRING DIAG:  Diagnosis  [phone] (ICD-10-CM) - Status post total left knee replacement    THERAPY DIAG:  Acute pain of left knee  Stiffness of left knee, not elsewhere classified  Difficulty in walking, not elsewhere classified  Localized edema  Rationale for Evaluation and Treatment:  Rehabilitation  ONSET DATE: left TKA on 09/01/22  SUBJECTIVE:   SUBJECTIVE STATEMENT: Khadeem reports he continues to sleep relatively normal with his pain meds (tylenol and muscle relaxer).  He reports continued good early HEP compliance.  Pt arriving to therapy s/p left TKA on 09/01/22. Pt amb with rolling walker. Pt states he has been using his home ice with elevation. Pt reporting 5 therapy visits. Pt reporting pain of 1/10 at rest. Worst pain with movements is 5-6/10.   PERTINENT HISTORY: Arthritis, GERD, thyroid disease, macular degeneration, hyperlipidemia, ankle arthroscopy, knee cartilage surgery, shoulder arthroscopy with rotator cuff repair, TKA on left 09/01/2022   PAIN:  NPRS scale: 0-5/10 this week Pain location: left knee Pain description: achy Aggravating factors: exercises, walking Relieving factors: resting, muscle relaxer, pain meds, ice  PRECAUTIONS: None  WEIGHT BEARING RESTRICTIONS: No WBAT  FALLS:  Has patient fallen in last 6 months? Yes, tripped over rope in yard while he was having work done.   LIVING ENVIRONMENT: Lives with: lives with their family and lives with their spouse Lives in: House/apartment Stairs: Yes: External: 3 steps; bilateral but cannot reach both Has following equipment at home: Dan Humphreys - 2 wheeled  OCCUPATION: retired  PLOF: Independent  PATIENT GOALS: walk without device  Next MD visit:   OBJECTIVE:   DIAGNOSTIC FINDINGS: IMPRESSION: Left knee arthroplasty without immediate postoperative complication.  PATIENT SURVEYS:  09/18/22  FOTO intake:    39%   COGNITION: Overall cognitive status: WFL    SENSATION: 09/18/22 WFL  EDEMA:  09/18/22 Circumferential: Rt    LOWER EXTREMITY ROM:   ROM Right 09/18/22 supine Left 09/18/22 supine Left 09/22/2022 Left 09/23/2022  Hip flexion      Hip extension      Hip abduction      Hip adduction      Hip internal rotation      Hip external rotation      Knee flexion A: 124 A:  72 P: 75 With belt 82 Active 83  Knee extension A: 0 A: -15 P: -12 Active -15 Active -10  Ankle dorsiflexion      Ankle plantarflexion      Ankle inversion      Ankle eversion       (Blank rows = not tested)  LOWER EXTREMITY STRENGTH:  MMT Right 09/18/22 Left 09/18/22  Hip flexion 5 4  Hip extension    Hip abduction    Hip adduction    Hip internal rotation    Hip external rotation    Knee flexion 5 3  Knee extension 5 3  Ankle dorsiflexion    Ankle plantarflexion    Ankle inversion    Ankle eversion     (Blank rows = not tested)    FUNCTIONAL TESTS:  09/18/22: 5 time sit to stand: 21 seconds with UE support  GAIT: Distance walked: 30 feet clinic distance, level surface Assistive device utilized: Environmental consultant - 2 wheeled Level of assistance: Modified independence Comments: increased knee flexion on the left                                                                                                                                                                        TODAY'S TREATMENT                                                                          DATE: 09/22/2022 Recumbent bike Seat 10 AAROM for 5 minutes Tailgate knee flexion 1 minute Seated AAROM knee flexion 10 x 10 seconds Seated straight leg raises 3 sets of 5 for 3 seconds (pause before lifting 3-4 inches) Prone knee extension stretch with rolled up towels above knees 3 minutes  Functional Activities: Double Leg Press 75# 10 x slow eccentrics  Single Leg Press Left only 37# 10 x slow eccentrics  Vaso left knee for 10 minutes Medium 34*  09/21/2022 Recumbent bike Seat 10 AAROM for 5 minutes Quadriceps sets with heel prop 2 sets of 10 for 5 seconds Supine knee flexion with belt 5 x 5 seconds Tailgate knee flexion 1 minute Seated AAROM knee flexion 5 x 10 seconds Seated straight leg raises 3 sets of 5 for 3 seconds (pause before lifting 3-4 inches)  Functional Activities: Double Leg Press 75# 10 x  slow eccentrics  Single Leg Press Left only 37# 10 x slow eccentrics  Vaso left knee for 10 minutes Medium 34*   09/18/2022 Therex:    HEP instruction/performance c cues for techniques, handout provided.  Trial set performed of each for comprehension and symptom assessment.  See below for exercise list  PATIENT EDUCATION:  Education details: HEP, POC Person educated: Patient Education method: Explanation, Demonstration, Verbal cues, and Handouts Education comprehension: verbalized understanding, returned demonstration, and verbal cues required  HOME EXERCISE PROGRAM: Access Code: [email or web address] URL: [email or web address] Date: 09/18/2022 Prepared by: Narda Amber  Exercises - Long Sitting Quad Set with Towel Roll Under Heel  - 3 x daily - 7 x weekly - 2 sets - 10 reps - 5 seconds hold - Supine Active Straight Leg Raise  - 3 x daily - 7 x weekly - 2 sets - 10 reps - Supine Heel Slide with Strap  - 3 x daily - 7 x weekly - 10 reps - 5 seconds hold - Sit to Stand with Armchair  - 3 x daily - 7 x weekly - 2 sets - 10 reps - Seated Heel Slide  - 3 x daily - 7 x weekly - 2 sets - 10 reps  ASSESSMENT:  CLINICAL IMPRESSION: Oluwanifemi has better quadriceps control and noticeably better range as compared to yesterday.  AROM is now 10 - 0 - 83 degrees.  Continue emphasis on AROM, quadriceps strength and edema control to meet long-term goals.   OBJECTIVE IMPAIRMENTS: decreased activity tolerance, decreased balance, decreased mobility, difficulty walking, decreased ROM, decreased strength, increased edema, and pain.   ACTIVITY LIMITATIONS: lifting, bending, sitting, standing, squatting, stairs, transfers, bed mobility, and dressing  PARTICIPATION LIMITATIONS: meal prep, cleaning, laundry, driving, and community activity  PERSONAL FACTORS: 3+ comorbidities: see history above  are also affecting patient's functional outcome.   REHAB POTENTIAL: Good  CLINICAL DECISION MAKING:  Stable/uncomplicated  EVALUATION COMPLEXITY: Low   GOALS: Goals reviewed with patient? Yes  SHORT TERM GOALS: (target date for Short term goals are 3 weeks 10/13/22)   1.  Patient will demonstrate independent use of home exercise program to maintain progress from in clinic treatments.  Goal status: On Going 09/21/2022  LONG TERM GOALS: (target dates for all long term goals are 12 weeks  12/15/22 )   1. Patient will demonstrate/report pain at worst less than or equal to 2/10 to facilitate minimal limitation in daily activity secondary to pain symptoms.  Goal status: New   2. Patient will demonstrate independent use of home exercise program to facilitate ability to maintain/progress functional gains from skilled physical therapy services.  Goal status: New   3. Patient will demonstrate FOTO outcome > or = 60 % to indicate reduced disability due to condition.  Goal status: New   4.  Patient will demonstrate left LE MMT 5/5 throughout to faciltiate usual transfers, stairs, squatting at [facility] for daily life.   Goal status: New   5.  Patient will demonstrate 5 time sit to stand: </= 13 seconds with no UE support Goal status: New   6.  Pt will be able to navigate 4-5 steps with single hand rail with step over step gait pattern.  Goal status: New  7. Pt will be able to perform left knee active ROM from 4 to 115 degrees for improved functional mobility.       PLAN:  PT FREQUENCY: 1-2x/week  PT DURATION: 10 weeks  PLANNED INTERVENTIONS: Therapeutic exercises, Therapeutic activity, Neuro Muscular re-education, Balance training, Gait training, Patient/Family education, Joint mobilization, Stair training, DME instructions, Dry Needling, Electrical stimulation, Traction, Cryotherapy, vasopneumatic deviceMoist heat, Taping, Ultrasound, Ionotophoresis 4mg /ml Dexamethasone, and aquatic therapy, Manual therapy.  All included unless contraindicated  PLAN FOR NEXT SESSION: Review HEP  knowledge/results, left quadriceps strengthening, left knee ROM, vasopneumatic for edema control     Cherlyn Cushing, PT, MPT 09/22/2022, 12:50 PM

## 2022-09-25 ENCOUNTER — Ambulatory Visit: Payer: Medicare HMO | Admitting: Rehabilitative and Restorative Service Providers"

## 2022-09-25 ENCOUNTER — Encounter: Payer: Self-pay | Admitting: Rehabilitative and Restorative Service Providers"

## 2022-09-25 DIAGNOSIS — M25562 Pain in left knee: Secondary | ICD-10-CM | POA: Diagnosis not present

## 2022-09-25 DIAGNOSIS — M25662 Stiffness of left knee, not elsewhere classified: Secondary | ICD-10-CM | POA: Diagnosis not present

## 2022-09-25 DIAGNOSIS — R262 Difficulty in walking, not elsewhere classified: Secondary | ICD-10-CM | POA: Diagnosis not present

## 2022-09-25 DIAGNOSIS — R600 Localized edema: Secondary | ICD-10-CM

## 2022-09-25 NOTE — Therapy (Signed)
OUTPATIENT PHYSICAL THERAPY LOWER EXTREMITY TREATMENT   Patient Name: Eddie Young MRN: [id number] DOB:11-23-1945, 77 y.o., male Today's Date: 09/25/2022  END OF SESSION:  PT End of Session - 09/25/22 1354     Visit Number 4    Number of Visits 24    Date for PT Re-Evaluation 12/15/22    Progress Note Due on Visit 10    PT Start Time 1347    PT Stop Time 1439    PT Time Calculation (min) 52 min    Activity Tolerance No increased pain;Patient tolerated treatment well    Behavior During Therapy WFL for tasks assessed/performed              Past Medical History:  Diagnosis Date   Arthritis    GERD (gastroesophageal reflux disease)    Hyperlipidemia    Hypothyroidism    Macular degeneration    Thyroid disease    hyper active   Past Surgical History:  Procedure Laterality Date   ANKLE ARTHROSCOPY WITH REPAIR SUBLUXING TENDON     KNEE CARTILAGE SURGERY     SHOULDER ARTHROSCOPY W/ ROTATOR CUFF REPAIR     TOTAL KNEE ARTHROPLASTY Left 09/01/2022   Procedure: LEFT TOTAL KNEE ARTHROPLASTY;  Surgeon: Kathryne Hitch, MD;  Location: [facility];  Service: Orthopedics;  Laterality: Left;   Patient Active Problem List   Diagnosis Date Noted   Status post total left knee replacement 09/01/2022   Unilateral primary osteoarthritis, right knee 06/21/2022   Unilateral primary osteoarthritis, left knee 09/13/2021   AC (acromioclavicular) arthritis 08/26/2019   Impingement syndrome of left shoulder 08/26/2019   Biceps tendonitis on left 08/26/2019   Pain in left shoulder 10/28/2018    PCP: Gwenlyn Found, MD   REFERRING PROVIDER: Kathryne Hitch*   REFERRING DIAG:  Diagnosis  [phone] (ICD-10-CM) - Status post total left knee replacement    THERAPY DIAG:  Acute pain of left knee  Stiffness of left knee, not elsewhere classified  Difficulty in walking, not elsewhere classified  Localized edema  Rationale for Evaluation and Treatment:  Rehabilitation  ONSET DATE: left TKA on 09/01/22  SUBJECTIVE:   SUBJECTIVE STATEMENT: Girolamo reports he continues to sleep relatively normal with his pain meds (tylenol and muscle relaxer).  He reports continued good early HEP compliance and notes "daily progress."  Pt arriving to therapy s/p left TKA on 09/01/22. Pt amb with rolling walker. Pt states he has been using his home ice with elevation. Pt reporting 5 therapy visits. Pt reporting pain of 1/10 at rest. Worst pain with movements is 5-6/10.   PERTINENT HISTORY: Arthritis, GERD, thyroid disease, macular degeneration, hyperlipidemia, ankle arthroscopy, knee cartilage surgery, shoulder arthroscopy with rotator cuff repair, TKA on left 09/01/2022   PAIN:  NPRS scale: 0-5/10 this week, even with the weather this weekend Pain location: left knee Pain description: achy Aggravating factors: exercises, walking Relieving factors: resting, muscle relaxer, pain meds, ice  PRECAUTIONS: None  WEIGHT BEARING RESTRICTIONS: No WBAT  FALLS:  Has patient fallen in last 6 months? Yes, tripped over rope in yard while he was having work done.   LIVING ENVIRONMENT: Lives with: lives with their family and lives with their spouse Lives in: House/apartment Stairs: Yes: External: 3 steps; bilateral but cannot reach both Has following equipment at home: Dan Humphreys - 2 wheeled  OCCUPATION: retired  PLOF: Independent  PATIENT GOALS: walk without device  Next MD visit:   OBJECTIVE:   DIAGNOSTIC FINDINGS: IMPRESSION: Left knee arthroplasty without  immediate postoperative complication.  PATIENT SURVEYS:  09/18/22  FOTO intake:    39%   COGNITION: Overall cognitive status: WFL    SENSATION: 09/18/22 WFL  EDEMA:  09/18/22 Circumferential: Rt    LOWER EXTREMITY ROM:   ROM Right 09/18/22 supine Left 09/18/22 supine Left 09/22/2022 Left 09/23/2022 Left 09/25/2022  Hip flexion       Hip extension       Hip abduction       Hip adduction        Hip internal rotation       Hip external rotation       Knee flexion A: 124 A: 72 P: 75 With belt 82 Active 83 Active 88  Knee extension A: 0 A: -15 P: -12 Active -15 Active -10 Active -10  Ankle dorsiflexion       Ankle plantarflexion       Ankle inversion       Ankle eversion        (Blank rows = not tested)  LOWER EXTREMITY STRENGTH:  MMT Right 09/18/22 Left 09/18/22  Hip flexion 5 4  Hip extension    Hip abduction    Hip adduction    Hip internal rotation    Hip external rotation    Knee flexion 5 3  Knee extension 5 3  Ankle dorsiflexion    Ankle plantarflexion    Ankle inversion    Ankle eversion     (Blank rows = not tested)    FUNCTIONAL TESTS:  09/18/22: 5 time sit to stand: 21 seconds with UE support  GAIT: Distance walked: 30 feet clinic distance, level surface Assistive device utilized: Environmental consultant - 2 wheeled Level of assistance: Modified independence Comments: increased knee flexion on the left                                                                                                                                                                        TODAY'S TREATMENT                                                                          DATE: 09/25/2022 Recumbent bike Seat 10 AAROM for 5 minutes Tailgate knee flexion 1 minute Seated AAROM knee flexion 10 x 10 seconds, PT Overpressure on last 5 Seated straight leg raises 3 sets of 10 for 3 seconds (pause before lifting 3-4 inches) Prone knee extension stretch with rolled up pillows above knees 3 minutes with  1#  Functional Activities: Double Leg Press 87# 10 x slow eccentrics  Single Leg Press Left only 43# 10 x slow eccentrics  Vaso left knee for 10 minutes Medium 34*   09/22/2022 Recumbent bike Seat 10 AAROM for 5 minutes Tailgate knee flexion 1 minute Seated AAROM knee flexion 10 x 10 seconds Seated straight leg raises 3 sets of 5 for 3 seconds (pause before lifting 3-4 inches) Prone  knee extension stretch with rolled up towels above knees 3 minutes  Functional Activities: Double Leg Press 75# 10 x slow eccentrics  Single Leg Press Left only 37# 10 x slow eccentrics  Vaso left knee for 10 minutes Medium 34*   09/21/2022 Recumbent bike Seat 10 AAROM for 5 minutes Quadriceps sets with heel prop 2 sets of 10 for 5 seconds Supine knee flexion with belt 5 x 5 seconds Tailgate knee flexion 1 minute Seated AAROM knee flexion 5 x 10 seconds Seated straight leg raises 3 sets of 5 for 3 seconds (pause before lifting 3-4 inches)  Functional Activities: Double Leg Press 75# 10 x slow eccentrics  Single Leg Press Left only 37# 10 x slow eccentrics  Vaso left knee for 10 minutes Medium 34*  PATIENT EDUCATION:  Education details: HEP, POC Person educated: Patient Education method: Programmer, multimedia, Demonstration, Verbal cues, and Handouts Education comprehension: verbalized understanding, returned demonstration, and verbal cues required  HOME EXERCISE PROGRAM: Access Code: [email or web address] URL: [email or web address] Date: 09/18/2022 Prepared by: Narda Amber  Exercises - Long Sitting Quad Set with Towel Roll Under Heel  - 3 x daily - 7 x weekly - 2 sets - 10 reps - 5 seconds hold - Supine Active Straight Leg Raise  - 3 x daily - 7 x weekly - 2 sets - 10 reps - Supine Heel Slide with Strap  - 3 x daily - 7 x weekly - 10 reps - 5 seconds hold - Sit to Stand with Armchair  - 3 x daily - 7 x weekly - 2 sets - 10 reps - Seated Heel Slide  - 3 x daily - 7 x weekly - 2 sets - 10 reps  ASSESSMENT:  CLINICAL IMPRESSION: Tramond's AROM is now 10 - 0 - 88 degrees.  Continue emphasis on AROM, quadriceps strength and edema control to meet long-term goals.  Although he was stiff at his presentation at evaluation, he is making progress on a daily basis and should meet long-term goals with the recommended plan of care.  OBJECTIVE IMPAIRMENTS: decreased activity tolerance,  decreased balance, decreased mobility, difficulty walking, decreased ROM, decreased strength, increased edema, and pain.   ACTIVITY LIMITATIONS: lifting, bending, sitting, standing, squatting, stairs, transfers, bed mobility, and dressing  PARTICIPATION LIMITATIONS: meal prep, cleaning, laundry, driving, and community activity  PERSONAL FACTORS: 3+ comorbidities: see history above  are also affecting patient's functional outcome.   REHAB POTENTIAL: Good  CLINICAL DECISION MAKING: Stable/uncomplicated  EVALUATION COMPLEXITY: Low   GOALS: Goals reviewed with patient? Yes  SHORT TERM GOALS: (target date for Short term goals are 3 weeks 10/13/22)   1.  Patient will demonstrate independent use of home exercise program to maintain progress from in clinic treatments.  Goal status: Met 09/25/2022  LONG TERM GOALS: (target dates for all long term goals are 12 weeks  12/15/22 )   1. Patient will demonstrate/report pain at worst less than or equal to 2/10 to facilitate minimal limitation in daily activity secondary to pain symptoms.  Goal status: Non Going 09/25/2022   2. Patient  will demonstrate independent use of home exercise program to facilitate ability to maintain/progress functional gains from skilled physical therapy services.  Goal status: On Going 09/25/2022   3. Patient will demonstrate FOTO outcome > or = 60 % to indicate reduced disability due to condition.  Goal status: New   4.  Patient will demonstrate left LE MMT 5/5 throughout to faciltiate usual transfers, stairs, squatting at [facility] for daily life.   Goal status: New   5.  Patient will demonstrate 5 time sit to stand: </= 13 seconds with no UE support Goal status: New   6.  Pt will be able to navigate 4-5 steps with single hand rail with step over step gait pattern.  Goal status: New  7. Pt will be able to perform left knee active ROM from 4 to 115 degrees for improved functional mobility.    Goal status: On Going  09/25/2022    PLAN:  PT FREQUENCY: 1-2x/week  PT DURATION: 10 weeks  PLANNED INTERVENTIONS: Therapeutic exercises, Therapeutic activity, Neuro Muscular re-education, Balance training, Gait training, Patient/Family education, Joint mobilization, Stair training, DME instructions, Dry Needling, Electrical stimulation, Traction, Cryotherapy, vasopneumatic deviceMoist heat, Taping, Ultrasound, Ionotophoresis 4mg /ml Dexamethasone, and aquatic therapy, Manual therapy.  All included unless contraindicated  PLAN FOR NEXT SESSION: Left quadriceps strengthening, left knee ROM, vasopneumatic for edema control     Cherlyn Cushing, PT, MPT 09/25/2022, 3:41 PM

## 2022-09-27 ENCOUNTER — Ambulatory Visit: Payer: Medicare HMO | Admitting: Rehabilitative and Restorative Service Providers"

## 2022-09-27 ENCOUNTER — Encounter: Payer: Self-pay | Admitting: Rehabilitative and Restorative Service Providers"

## 2022-09-27 DIAGNOSIS — R262 Difficulty in walking, not elsewhere classified: Secondary | ICD-10-CM

## 2022-09-27 DIAGNOSIS — M25662 Stiffness of left knee, not elsewhere classified: Secondary | ICD-10-CM

## 2022-09-27 DIAGNOSIS — M25562 Pain in left knee: Secondary | ICD-10-CM | POA: Diagnosis not present

## 2022-09-27 DIAGNOSIS — R600 Localized edema: Secondary | ICD-10-CM | POA: Diagnosis not present

## 2022-09-27 NOTE — Therapy (Signed)
OUTPATIENT PHYSICAL THERAPY LOWER EXTREMITY TREATMENT   Patient Name: Eddie Young MRN: [id number] DOB:July 07, 1945, 77 y.o., male Today's Date: 09/27/2022  END OF SESSION:  PT End of Session - 09/27/22 0937     Visit Number 5    Number of Visits 24    Date for PT Re-Evaluation 12/15/22    Progress Note Due on Visit 10    PT Start Time 0935    PT Stop Time 1031    PT Time Calculation (min) 56 min    Activity Tolerance No increased pain;Patient tolerated treatment well;Patient limited by pain    Behavior During Therapy [facility] for tasks assessed/performed              Past Medical History:  Diagnosis Date   Arthritis    GERD (gastroesophageal reflux disease)    Hyperlipidemia    Hypothyroidism    Macular degeneration    Thyroid disease    hyper active   Past Surgical History:  Procedure Laterality Date   ANKLE ARTHROSCOPY WITH REPAIR SUBLUXING TENDON     KNEE CARTILAGE SURGERY     SHOULDER ARTHROSCOPY W/ ROTATOR CUFF REPAIR     TOTAL KNEE ARTHROPLASTY Left 09/01/2022   Procedure: LEFT TOTAL KNEE ARTHROPLASTY;  Surgeon: Kathryne Hitch, MD;  Location: [facility];  Service: Orthopedics;  Laterality: Left;   Patient Active Problem List   Diagnosis Date Noted   Status post total left knee replacement 09/01/2022   Unilateral primary osteoarthritis, right knee 06/21/2022   Unilateral primary osteoarthritis, left knee 09/13/2021   AC (acromioclavicular) arthritis 08/26/2019   Impingement syndrome of left shoulder 08/26/2019   Biceps tendonitis on left 08/26/2019   Pain in left shoulder 10/28/2018    PCP: Gwenlyn Found, MD   REFERRING PROVIDER: Kathryne Hitch*   REFERRING DIAG:  Diagnosis  [phone] (ICD-10-CM) - Status post total left knee replacement    THERAPY DIAG:  Acute pain of left knee  Stiffness of left knee, not elsewhere classified  Difficulty in walking, not elsewhere classified  Localized edema  Rationale for Evaluation and  Treatment: Rehabilitation  ONSET DATE: left TKA on 09/01/22  SUBJECTIVE:   SUBJECTIVE STATEMENT: Ajax reports sleep is normal with his pain meds (tylenol and muscle relaxer).  He reports continued good early HEP compliance.  He notes lateral thigh "tightness" which we will address today.  Pt arriving to therapy s/p left TKA on 09/01/22. Pt amb with rolling walker. Pt states he has been using his home ice with elevation. Pt reporting 5 therapy visits. Pt reporting pain of 1/10 at rest. Worst pain with movements is 5-6/10.   PERTINENT HISTORY: Arthritis, GERD, thyroid disease, macular degeneration, hyperlipidemia, ankle arthroscopy, knee cartilage surgery, shoulder arthroscopy with rotator cuff repair, TKA on left 09/01/2022   PAIN:  NPRS scale: 0-5/10 this week Pain location: left knee and lateral thigh Pain description: achy Aggravating factors: exercises, walking Relieving factors: resting, muscle relaxer, pain meds, ice  PRECAUTIONS: None  WEIGHT BEARING RESTRICTIONS: No WBAT  FALLS:  Has patient fallen in last 6 months? Yes, tripped over rope in yard while he was having work done.   LIVING ENVIRONMENT: Lives with: lives with their family and lives with their spouse Lives in: House/apartment Stairs: Yes: External: 3 steps; bilateral but cannot reach both Has following equipment at home: Dan Humphreys - 2 wheeled  OCCUPATION: retired  PLOF: Independent  PATIENT GOALS: walk without device  Next MD visit:   OBJECTIVE:   DIAGNOSTIC FINDINGS: IMPRESSION:  Left knee arthroplasty without immediate postoperative complication.  PATIENT SURVEYS:  09/18/22  FOTO intake:    39%   COGNITION: Overall cognitive status: WFL    SENSATION: 09/18/22 WFL  EDEMA:  09/18/22 Circumferential: Rt    LOWER EXTREMITY ROM:   ROM Right 09/18/22 supine Left 09/18/22 supine Left 09/22/2022 Left 09/23/2022 Left 09/25/2022 Left 09/27/2022  Hip flexion        Hip extension        Hip abduction         Hip adduction        Hip internal rotation        Hip external rotation        Knee flexion A: 124 A: 72 P: 75 With belt 82 Active 83 Active 88 Active 92  Knee extension A: 0 A: -15 P: -12 Active -15 Active -10 Active -10 Active -9  Ankle dorsiflexion        Ankle plantarflexion        Ankle inversion        Ankle eversion         (Blank rows = not tested)  LOWER EXTREMITY STRENGTH:  MMT Right 09/18/22 Left 09/18/22  Hip flexion 5 4  Hip extension    Hip abduction    Hip adduction    Hip internal rotation    Hip external rotation    Knee flexion 5 3  Knee extension 5 3  Ankle dorsiflexion    Ankle plantarflexion    Ankle inversion    Ankle eversion     (Blank rows = not tested)    FUNCTIONAL TESTS:  09/18/22: 5 time sit to stand: 21 seconds with UE support  GAIT: Distance walked: 30 feet clinic distance, level surface Assistive device utilized: Environmental consultant - 2 wheeled Level of assistance: Modified independence Comments: increased knee flexion on the left                                                                                                                                                                        TODAY'S TREATMENT                                                                          DATE: 09/27/2022 Recumbent bike Seat 10 AAROM for 5 minutes Tailgate knee flexion 1 minute Seated AAROM knee flexion 10 x 10 seconds, PT Overpressure on last 5 Seated straight leg raises 2 sets of 10 for  3 seconds (pause before lifting 3-4 inches) Prone knee extension stretch with rolled up pillows above knees 3 minutes with 1#  Functional Activities: Double Leg Press 100# 10 x slow eccentrics  Single Leg Press Left only 50# 10 x slow eccentrics  Vaso left knee for 10 minutes Medium 34*   09/25/2022 Recumbent bike Seat 10 AAROM for 5 minutes Tailgate knee flexion 1 minute Seated AAROM knee flexion 10 x 10 seconds, PT Overpressure on last 5 Seated straight leg  raises 3 sets of 10 for 3 seconds (pause before lifting 3-4 inches) Prone knee extension stretch with rolled up pillows above knees 3 minutes with 1#  Functional Activities: Double Leg Press 87# 10 x slow eccentrics  Single Leg Press Left only 43# 10 x slow eccentrics  Vaso left knee for 10 minutes Medium 34*   09/22/2022 Recumbent bike Seat 10 AAROM for 5 minutes Tailgate knee flexion 1 minute Seated AAROM knee flexion 10 x 10 seconds Seated straight leg raises 3 sets of 5 for 3 seconds (pause before lifting 3-4 inches) Prone knee extension stretch with rolled up towels above knees 3 minutes  Functional Activities: Double Leg Press 75# 10 x slow eccentrics  Single Leg Press Left only 37# 10 x slow eccentrics  Vaso left knee for 10 minutes Medium 34*   PATIENT EDUCATION:  Education details: HEP, POC Person educated: Patient Education method: Programmer, multimedia, Demonstration, Verbal cues, and Handouts Education comprehension: verbalized understanding, returned demonstration, and verbal cues required  HOME EXERCISE PROGRAM: Access Code: [email or web address] URL: [email or web address] Date: 09/18/2022 Prepared by: Narda Amber  Exercises - Long Sitting Quad Set with Towel Roll Under Heel  - 3 x daily - 7 x weekly - 2 sets - 10 reps - 5 seconds hold - Supine Active Straight Leg Raise  - 3 x daily - 7 x weekly - 2 sets - 10 reps - Supine Heel Slide with Strap  - 3 x daily - 7 x weekly - 10 reps - 5 seconds hold - Sit to Stand with Armchair  - 3 x daily - 7 x weekly - 2 sets - 10 reps - Seated Heel Slide  - 3 x daily - 7 x weekly - 2 sets - 10 reps  ASSESSMENT:  CLINICAL IMPRESSION: Saman's AROM is now 9 - 0 - 92 degrees.  Early HEP emphasis should remain AROM, quadriceps strength and edema control.  Although stiffer than we would like, Dawn continues to make progress and should meet long-term goals within the recommended plan of care.  OK to progress balance, gait and  functional activities like stairs as long as HEP compliance continues and AROM continues to progress to meet long-term goals.  OBJECTIVE IMPAIRMENTS: decreased activity tolerance, decreased balance, decreased mobility, difficulty walking, decreased ROM, decreased strength, increased edema, and pain.   ACTIVITY LIMITATIONS: lifting, bending, sitting, standing, squatting, stairs, transfers, bed mobility, and dressing  PARTICIPATION LIMITATIONS: meal prep, cleaning, laundry, driving, and community activity  PERSONAL FACTORS: 3+ comorbidities: see history above  are also affecting patient's functional outcome.   REHAB POTENTIAL: Good  CLINICAL DECISION MAKING: Stable/uncomplicated  EVALUATION COMPLEXITY: Low   GOALS: Goals reviewed with patient? Yes  SHORT TERM GOALS: (target date for Short term goals are 3 weeks 10/13/22)   1.  Patient will demonstrate independent use of home exercise program to maintain progress from in clinic treatments.  Goal status: Met 09/25/2022  LONG TERM GOALS: (target dates for all long term goals are 12 weeks  12/15/22 )  1. Patient will demonstrate/report pain at worst less than or equal to 2/10 to facilitate minimal limitation in daily activity secondary to pain symptoms.  Goal status: On Going 09/27/2022   2. Patient will demonstrate independent use of home exercise program to facilitate ability to maintain/progress functional gains from skilled physical therapy services.  Goal status: On Going 09/27/2022   3. Patient will demonstrate FOTO outcome > or = 60 % to indicate reduced disability due to condition.  Goal status: New   4.  Patient will demonstrate left LE MMT 5/5 throughout to faciltiate usual transfers, stairs, squatting at [facility] for daily life.   Goal status: New   5.  Patient will demonstrate 5 time sit to stand: </= 13 seconds with no UE support Goal status: New   6.  Pt will be able to navigate 4-5 steps with single hand rail with step  over step gait pattern.  Goal status: New  7. Pt will be able to perform left knee active ROM from 4 to 115 degrees for improved functional mobility.    Goal status: On Going 09/27/2022    PLAN:  PT FREQUENCY: 1-2x/week  PT DURATION: 10 weeks  PLANNED INTERVENTIONS: Therapeutic exercises, Therapeutic activity, Neuro Muscular re-education, Balance training, Gait training, Patient/Family education, Joint mobilization, Stair training, DME instructions, Dry Needling, Electrical stimulation, Traction, Cryotherapy, vasopneumatic deviceMoist heat, Taping, Ultrasound, Ionotophoresis 4mg /ml Dexamethasone, and aquatic therapy, Manual therapy.  All included unless contraindicated  PLAN FOR NEXT SESSION: Left quadriceps strengthening, left knee ROM, vasopneumatic for edema control.  OK to progress balance, gait and stairs.     Cherlyn Cushing, PT, MPT 09/27/2022, 10:28 AM

## 2022-09-28 ENCOUNTER — Ambulatory Visit: Payer: Medicare HMO | Admitting: Rehabilitative and Restorative Service Providers"

## 2022-09-28 ENCOUNTER — Encounter: Payer: Self-pay | Admitting: Rehabilitative and Restorative Service Providers"

## 2022-09-28 DIAGNOSIS — M25662 Stiffness of left knee, not elsewhere classified: Secondary | ICD-10-CM | POA: Diagnosis not present

## 2022-09-28 DIAGNOSIS — R262 Difficulty in walking, not elsewhere classified: Secondary | ICD-10-CM | POA: Diagnosis not present

## 2022-09-28 DIAGNOSIS — M25562 Pain in left knee: Secondary | ICD-10-CM | POA: Diagnosis not present

## 2022-09-28 DIAGNOSIS — R600 Localized edema: Secondary | ICD-10-CM | POA: Diagnosis not present

## 2022-09-28 NOTE — Therapy (Signed)
OUTPATIENT PHYSICAL THERAPY LOWER EXTREMITY TREATMENT   Patient Name: Eddie Young MRN: [id number] DOB:15-Sep-1945, 77 y.o., male Today's Date: 09/28/2022  END OF SESSION:  PT End of Session - 09/28/22 1402     Visit Number 6    Number of Visits 24    Date for PT Re-Evaluation 12/15/22    Progress Note Due on Visit 10    PT Start Time 1347    PT Stop Time 1436    PT Time Calculation (min) 49 min    Activity Tolerance No increased pain;Patient tolerated treatment well    Behavior During Therapy WFL for tasks assessed/performed             Past Medical History:  Diagnosis Date   Arthritis    GERD (gastroesophageal reflux disease)    Hyperlipidemia    Hypothyroidism    Macular degeneration    Thyroid disease    hyper active   Past Surgical History:  Procedure Laterality Date   ANKLE ARTHROSCOPY WITH REPAIR SUBLUXING TENDON     KNEE CARTILAGE SURGERY     SHOULDER ARTHROSCOPY W/ ROTATOR CUFF REPAIR     TOTAL KNEE ARTHROPLASTY Left 09/01/2022   Procedure: LEFT TOTAL KNEE ARTHROPLASTY;  Surgeon: Kathryne Hitch, MD;  Location: [facility];  Service: Orthopedics;  Laterality: Left;   Patient Active Problem List   Diagnosis Date Noted   Status post total left knee replacement 09/01/2022   Unilateral primary osteoarthritis, right knee 06/21/2022   Unilateral primary osteoarthritis, left knee 09/13/2021   AC (acromioclavicular) arthritis 08/26/2019   Impingement syndrome of left shoulder 08/26/2019   Biceps tendonitis on left 08/26/2019   Pain in left shoulder 10/28/2018    PCP: Gwenlyn Found, MD   REFERRING PROVIDER: Kathryne Hitch*   REFERRING DIAG:  Diagnosis  [phone] (ICD-10-CM) - Status post total left knee replacement    THERAPY DIAG:  Acute pain of left knee  Stiffness of left knee, not elsewhere classified  Difficulty in walking, not elsewhere classified  Localized edema  Rationale for Evaluation and Treatment:  Rehabilitation  ONSET DATE: left TKA on 09/01/22  SUBJECTIVE:   SUBJECTIVE STATEMENT: Eddie Young reports sleep remains normal with his pain meds (tylenol and muscle relaxer).  He reports continued good early HEP compliance, including with his new IT Band stretch we added to address lateral thigh tightness.  Pt arriving to therapy s/p left TKA on 09/01/22. Pt amb with rolling walker. Pt states he has been using his home ice with elevation. Pt reporting 5 therapy visits. Pt reporting pain of 1/10 at rest. Worst pain with movements is 5-6/10.   PERTINENT HISTORY: Arthritis, GERD, thyroid disease, macular degeneration, hyperlipidemia, ankle arthroscopy, knee cartilage surgery, shoulder arthroscopy with rotator cuff repair, TKA on left 09/01/2022   PAIN:  NPRS scale: 0-5/10 this week Pain location: left knee and lateral thigh Pain description: achy Aggravating factors: exercises, walking Relieving factors: resting, muscle relaxer, pain meds, ice  PRECAUTIONS: None  WEIGHT BEARING RESTRICTIONS: No WBAT  FALLS:  Has patient fallen in last 6 months? Yes, tripped over rope in yard while he was having work done.   LIVING ENVIRONMENT: Lives with: lives with their family and lives with their spouse Lives in: House/apartment Stairs: Yes: External: 3 steps; bilateral but cannot reach both Has following equipment at home: Dan Humphreys - 2 wheeled  OCCUPATION: retired  PLOF: Independent  PATIENT GOALS: walk without device  Next MD visit:   OBJECTIVE:   DIAGNOSTIC FINDINGS: IMPRESSION: Left  knee arthroplasty without immediate postoperative complication.  PATIENT SURVEYS:  09/18/22  FOTO intake:    39%   COGNITION: Overall cognitive status: WFL    SENSATION: 09/18/22 WFL  EDEMA:  09/18/22 Circumferential: Rt    LOWER EXTREMITY ROM:   ROM Right 09/18/22 supine Left 09/18/22 supine Left 09/22/2022 Left 09/23/2022 Left 09/25/2022 Left 09/27/2022  Hip flexion        Hip extension        Hip  abduction        Hip adduction        Hip internal rotation        Hip external rotation        Knee flexion A: 124 A: 72 P: 75 With belt 82 Active 83 Active 88 Active 92  Knee extension A: 0 A: -15 P: -12 Active -15 Active -10 Active -10 Active -9  Ankle dorsiflexion        Ankle plantarflexion        Ankle inversion        Ankle eversion         (Blank rows = not tested)  LOWER EXTREMITY STRENGTH:  MMT Right 09/18/22 Left 09/18/22  Hip flexion 5 4  Hip extension    Hip abduction    Hip adduction    Hip internal rotation    Hip external rotation    Knee flexion 5 3  Knee extension 5 3  Ankle dorsiflexion    Ankle plantarflexion    Ankle inversion    Ankle eversion     (Blank rows = not tested)    FUNCTIONAL TESTS:  09/18/22: 5 time sit to stand: 21 seconds with UE support  GAIT: Distance walked: 30 feet clinic distance, level surface Assistive device utilized: Environmental consultant - 2 wheeled Level of assistance: Modified independence Comments: increased knee flexion on the left                                                                                                                                                                        TODAY'S TREATMENT                                                                          DATE: 09/28/2022 Recumbent bike Seat 10 AAROM for 5 minutes Prone knee extension stretch with rolled up pillows above knees 3 minutes with 3# IT Band stretch 2 x 20 seconds left only (hamstrings stretch with adduction and  IR while supine)  HEP only: -Tailgate knee flexion 1 minute -Seated AAROM knee flexion 10 x 10 seconds, PT Overpressure on last 5 -Seated straight leg raises 2 sets of 10 for 3 seconds (pause before lifting 3-4 inches)  Functional Activities: Double Leg Press 100# 10 x slow eccentrics  Single Leg Press Left only 50# 10 x slow eccentrics Step-up and over 4 and 6 inch step 6 x each slow eccentrics Step-down off 4 and 6 inch step 10  x each slow eccentrics Dynamic tandem balance 4 laps  Neuromuscular re-education: Tandem balance 4 x 20 seconds  Vaso left knee for 10 minutes Medium 34*   09/27/2022 Recumbent bike Seat 10 AAROM for 5 minutes Tailgate knee flexion 1 minute Seated AAROM knee flexion 10 x 10 seconds, PT Overpressure on last 5 Seated straight leg raises 2 sets of 10 for 3 seconds (pause before lifting 3-4 inches) Prone knee extension stretch with rolled up pillows above knees 3 minutes with 1#  Functional Activities: Double Leg Press 100# 10 x slow eccentrics  Single Leg Press Left only 50# 10 x slow eccentrics  Vaso left knee for 10 minutes Medium 34*   09/25/2022 Recumbent bike Seat 10 AAROM for 5 minutes Tailgate knee flexion 1 minute Seated AAROM knee flexion 10 x 10 seconds, PT Overpressure on last 5 Seated straight leg raises 3 sets of 10 for 3 seconds (pause before lifting 3-4 inches) Prone knee extension stretch with rolled up pillows above knees 3 minutes with 1#  Functional Activities: Double Leg Press 87# 10 x slow eccentrics  Single Leg Press Left only 43# 10 x slow eccentrics  Vaso left knee for 10 minutes Medium 34*   PATIENT EDUCATION:  Education details: HEP, POC Person educated: Patient Education method: Programmer, multimedia, Demonstration, Verbal cues, and Handouts Education comprehension: verbalized understanding, returned demonstration, and verbal cues required  HOME EXERCISE PROGRAM: Access Code: [email or web address] URL: [email or web address] Date: 09/18/2022 Prepared by: Narda Amber  Exercises - Long Sitting Quad Set with Towel Roll Under Heel  - 3 x daily - 7 x weekly - 2 sets - 10 reps - 5 seconds hold - Supine Active Straight Leg Raise  - 3 x daily - 7 x weekly - 2 sets - 10 reps - Supine Heel Slide with Strap  - 3 x daily - 7 x weekly - 10 reps - 5 seconds hold - Sit to Stand with Armchair  - 3 x daily - 7 x weekly - 2 sets - 10 reps - Seated Heel Slide  - 3 x  daily - 7 x weekly - 2 sets - 10 reps  ASSESSMENT:  CLINICAL IMPRESSION: Eddie Young continues to do a great job with his home exercise compliance.  HEP emphasis should remain AROM, quadriceps strength and edema control.  We were able to progress balance, gait and functional activities like stairs, although I recommended Daryl continue his current home exercise program focused on active range of motion, strength and edema control for the time being given his 9 degree extension deficit and flexion active range of motion less than 100 degrees.  OBJECTIVE IMPAIRMENTS: decreased activity tolerance, decreased balance, decreased mobility, difficulty walking, decreased ROM, decreased strength, increased edema, and pain.   ACTIVITY LIMITATIONS: lifting, bending, sitting, standing, squatting, stairs, transfers, bed mobility, and dressing  PARTICIPATION LIMITATIONS: meal prep, cleaning, laundry, driving, and community activity  PERSONAL FACTORS: 3+ comorbidities: see history above  are also affecting patient's functional outcome.   REHAB POTENTIAL: Good  CLINICAL  DECISION MAKING: Stable/uncomplicated  EVALUATION COMPLEXITY: Low   GOALS: Goals reviewed with patient? Yes  SHORT TERM GOALS: (target date for Short term goals are 3 weeks 10/13/22)   1.  Patient will demonstrate independent use of home exercise program to maintain progress from in clinic treatments.  Goal status: Met 09/25/2022  LONG TERM GOALS: (target dates for all long term goals are 12 weeks  12/15/22 )   1. Patient will demonstrate/report pain at worst less than or equal to 2/10 to facilitate minimal limitation in daily activity secondary to pain symptoms.  Goal status: On Going 09/27/2022   2. Patient will demonstrate independent use of home exercise program to facilitate ability to maintain/progress functional gains from skilled physical therapy services.  Goal status: On Going 09/27/2022   3. Patient will demonstrate FOTO  outcome > or = 60 % to indicate reduced disability due to condition.  Goal status: New   4.  Patient will demonstrate left LE MMT 5/5 throughout to faciltiate usual transfers, stairs, squatting at [facility] for daily life.   Goal status: New   5.  Patient will demonstrate 5 time sit to stand: </= 13 seconds with no UE support Goal status: New   6.  Pt will be able to navigate 4-5 steps with single hand rail with step over step gait pattern.  Goal status: New  7. Pt will be able to perform left knee active ROM from 4 to 115 degrees for improved functional mobility.    Goal status: On Going 09/27/2022    PLAN:  PT FREQUENCY: 1-2x/week  PT DURATION: 10 weeks  PLANNED INTERVENTIONS: Therapeutic exercises, Therapeutic activity, Neuro Muscular re-education, Balance training, Gait training, Patient/Family education, Joint mobilization, Stair training, DME instructions, Dry Needling, Electrical stimulation, Traction, Cryotherapy, vasopneumatic deviceMoist heat, Taping, Ultrasound, Ionotophoresis 4mg /ml Dexamethasone, and aquatic therapy, Manual therapy.  All included unless contraindicated  PLAN FOR NEXT SESSION: Left quadriceps strengthening, left knee ROM, vasopneumatic for edema control.  OK to progress balance, gait and stairs.     Cherlyn Cushing, PT, MPT 09/28/2022, 4:18 PM

## 2022-10-02 ENCOUNTER — Encounter: Payer: Medicare HMO | Admitting: Physical Therapy

## 2022-10-03 ENCOUNTER — Encounter: Payer: Self-pay | Admitting: Rehabilitative and Restorative Service Providers"

## 2022-10-03 ENCOUNTER — Ambulatory Visit: Payer: Medicare HMO | Admitting: Rehabilitative and Restorative Service Providers"

## 2022-10-03 DIAGNOSIS — M25562 Pain in left knee: Secondary | ICD-10-CM | POA: Diagnosis not present

## 2022-10-03 DIAGNOSIS — M25662 Stiffness of left knee, not elsewhere classified: Secondary | ICD-10-CM

## 2022-10-03 DIAGNOSIS — R600 Localized edema: Secondary | ICD-10-CM

## 2022-10-03 DIAGNOSIS — R262 Difficulty in walking, not elsewhere classified: Secondary | ICD-10-CM | POA: Diagnosis not present

## 2022-10-03 NOTE — Therapy (Signed)
OUTPATIENT PHYSICAL THERAPY TREATMENT   Patient Name: Eddie Young MRN: [id number] DOB:09/09/1945, 77 y.o., male Today's Date: 10/03/2022  END OF SESSION:  PT End of Session - 10/03/22 1437     Visit Number 7    Number of Visits 24    Date for PT Re-Evaluation 12/15/22    Progress Note Due on Visit 10    PT Start Time 1430    PT Stop Time 1520    PT Time Calculation (min) 50 min    Activity Tolerance Patient tolerated treatment well    Behavior During Therapy WFL for tasks assessed/performed              Past Medical History:  Diagnosis Date   Arthritis    GERD (gastroesophageal reflux disease)    Hyperlipidemia    Hypothyroidism    Macular degeneration    Thyroid disease    hyper active   Past Surgical History:  Procedure Laterality Date   ANKLE ARTHROSCOPY WITH REPAIR SUBLUXING TENDON     KNEE CARTILAGE SURGERY     SHOULDER ARTHROSCOPY W/ ROTATOR CUFF REPAIR     TOTAL KNEE ARTHROPLASTY Left 09/01/2022   Procedure: LEFT TOTAL KNEE ARTHROPLASTY;  Surgeon: Kathryne Hitch, MD;  Location: [facility];  Service: Orthopedics;  Laterality: Left;   Patient Active Problem List   Diagnosis Date Noted   Status post total left knee replacement 09/01/2022   Unilateral primary osteoarthritis, right knee 06/21/2022   Unilateral primary osteoarthritis, left knee 09/13/2021   AC (acromioclavicular) arthritis 08/26/2019   Impingement syndrome of left shoulder 08/26/2019   Biceps tendonitis on left 08/26/2019   Pain in left shoulder 10/28/2018    PCP: Gwenlyn Found, MD   REFERRING PROVIDER: Kathryne Hitch*   REFERRING DIAG:  Diagnosis  [phone] (ICD-10-CM) - Status post total left knee replacement    THERAPY DIAG:  Acute pain of left knee  Stiffness of left knee, not elsewhere classified  Difficulty in walking, not elsewhere classified  Localized edema  Rationale for Evaluation and Treatment: Rehabilitation  ONSET DATE: left TKA on  09/01/22  SUBJECTIVE:   SUBJECTIVE STATEMENT: Pt indicated having the clicking noise.  Reported stiffness.   PERTINENT HISTORY: Arthritis, GERD, thyroid disease, macular degeneration, hyperlipidemia, ankle arthroscopy, knee cartilage surgery, shoulder arthroscopy with rotator cuff repair, TKA on left 09/01/2022  PAIN:  NPRS scale: upon arrival 0/10 Pain location: left knee and lateral thigh Pain description: achy Aggravating factors: exercises, walking Relieving factors: resting, muscle relaxer, pain meds, ice  PRECAUTIONS: None  WEIGHT BEARING RESTRICTIONS: No WBAT  FALLS:  Has patient fallen in last 6 months? Yes, tripped over rope in yard while he was having work done.   LIVING ENVIRONMENT: Lives with: lives with their family and lives with their spouse Lives in: House/apartment Stairs: Yes: External: 3 steps; bilateral but cannot reach both Has following equipment at home: Dan Humphreys - 2 wheeled  OCCUPATION: retired  PLOF: Independent  PATIENT GOALS: walk without device  OBJECTIVE:   DIAGNOSTIC FINDINGS: IMPRESSION: Left knee arthroplasty without immediate postoperative complication.  PATIENT SURVEYS:  09/18/22  FOTO intake:    39%   COGNITION: Overall cognitive status: WFL    SENSATION: 09/18/22 WFL  EDEMA:    09/18/22 Circumferential: Rt    LOWER EXTREMITY ROM:   ROM Right 09/18/22 supine Left 09/18/22 supine Left 09/22/2022 Left 09/23/2022 Left 09/25/2022 Left 09/27/2022  Hip flexion        Hip extension  Hip abduction        Hip adduction        Hip internal rotation        Hip external rotation        Knee flexion A: 124 A: 72 P: 75 With belt 82 Active 83 Active 88 Active 92  Knee extension A: 0 A: -15 P: -12 Active -15 Active -10 Active -10 Active -9  Ankle dorsiflexion        Ankle plantarflexion        Ankle inversion        Ankle eversion         (Blank rows = not tested)  LOWER EXTREMITY STRENGTH:  MMT Right 09/18/22 Left 09/18/22  Hip  flexion 5 4  Hip extension    Hip abduction    Hip adduction    Hip internal rotation    Hip external rotation    Knee flexion 5 3  Knee extension 5 3  Ankle dorsiflexion    Ankle plantarflexion    Ankle inversion    Ankle eversion     (Blank rows = not tested)    FUNCTIONAL TESTS:  09/18/22: 5 time sit to stand: 21 seconds with UE support  GAIT: Distance walked: 30 feet clinic distance, level surface Assistive device utilized: Environmental consultant - 2 wheeled Level of assistance: Modified independence Comments: increased knee flexion on the left                                                                                                                                                                        TODAY'S TREATMENT                                                                          DATE: 10/03/2022 Therex: Recumbent bike partial circles 6 mins seat 10 Incline gastroc stretch 30 sec x 3 bilaterally  Discussed frequency of HEP with cues for small amounts at a time but routine during the day.  Continued emphasis on TKE stretching as well c heel prop options with ice.   Supine TKE stretch in start of vaso 5 mins  TherActivity (to improve ambulation, transfers, stair navigation) Step up forward with backward step off WB on Lt leg no UE assist 4 inch step x 15 - cues for limiting hip circumduction Lateral step down WB on Lt leg 4 inch step x 15 (cues to touch foot flat) Leg press double leg 100  lbs x 15 with pauses into flexion stretch, single leg 50 lbs 2 x 15  Vaso pneumatic  Lt knee 10 mins medium 34 degrees in elevaiton.   TODAY'S TREATMENT                                                                          DATE: 09/28/2022 Recumbent bike Seat 10 AAROM for 5 minutes Prone knee extension stretch with rolled up pillows above knees 3 minutes with 3# IT Band stretch 2 x 20 seconds left only (hamstrings stretch with adduction and IR while supine)  HEP only: -Tailgate  knee flexion 1 minute -Seated AAROM knee flexion 10 x 10 seconds, PT Overpressure on last 5 -Seated straight leg raises 2 sets of 10 for 3 seconds (pause before lifting 3-4 inches)  Functional Activities: Double Leg Press 100# 10 x slow eccentrics  Single Leg Press Left only 50# 10 x slow eccentrics Step-up and over 4 and 6 inch step 6 x each slow eccentrics Step-down off 4 and 6 inch step 10 x each slow eccentrics Dynamic tandem balance 4 laps  Neuromuscular re-education: Tandem balance 4 x 20 seconds  Vaso left knee for 10 minutes Medium 34*   TODAY'S TREATMENT                                                                          DATE:09/27/2022 Recumbent bike Seat 10 AAROM for 5 minutes Tailgate knee flexion 1 minute Seated AAROM knee flexion 10 x 10 seconds, PT Overpressure on last 5 Seated straight leg raises 2 sets of 10 for 3 seconds (pause before lifting 3-4 inches) Prone knee extension stretch with rolled up pillows above knees 3 minutes with 1#  Functional Activities: Double Leg Press 100# 10 x slow eccentrics  Single Leg Press Left only 50# 10 x slow eccentrics  Vaso left knee for 10 minutes Medium 34*   TODAY'S TREATMENT                                                                          DATE: 09/25/2022 Recumbent bike Seat 10 AAROM for 5 minutes Tailgate knee flexion 1 minute Seated AAROM knee flexion 10 x 10 seconds, PT Overpressure on last 5 Seated straight leg raises 3 sets of 10 for 3 seconds (pause before lifting 3-4 inches) Prone knee extension stretch with rolled up pillows above knees 3 minutes with 1#  Functional Activities: Double Leg Press 87# 10 x slow eccentrics  Single Leg Press Left only 43# 10 x slow eccentrics  Vaso left knee for 10 minutes Medium 34*   PATIENT EDUCATION:  Education details: HEP, POC Person educated: Patient Education method: Explanation,  Demonstration, Verbal cues, and Handouts Education comprehension: verbalized  understanding, returned demonstration, and verbal cues required  HOME EXERCISE PROGRAM: Access Code: [email or web address] URL: [email or web address] Date: 09/18/2022 Prepared by: Narda Amber  Exercises - Long Sitting Quad Set with Towel Roll Under Heel  - 3 x daily - 7 x weekly - 2 sets - 10 reps - 5 seconds hold - Supine Active Straight Leg Raise  - 3 x daily - 7 x weekly - 2 sets - 10 reps - Supine Heel Slide with Strap  - 3 x daily - 7 x weekly - 10 reps - 5 seconds hold - Sit to Stand with Armchair  - 3 x daily - 7 x weekly - 2 sets - 10 reps - Seated Heel Slide  - 3 x daily - 7 x weekly - 2 sets - 10 reps  ASSESSMENT:  CLINICAL IMPRESSION: General joint stiffness still evident and impacting end range flexion and extension on Lt knee.  Recommend combination of manual, exercise and HEP to progress towards goals.    OBJECTIVE IMPAIRMENTS: decreased activity tolerance, decreased balance, decreased mobility, difficulty walking, decreased ROM, decreased strength, increased edema, and pain.   ACTIVITY LIMITATIONS: lifting, bending, sitting, standing, squatting, stairs, transfers, bed mobility, and dressing  PARTICIPATION LIMITATIONS: meal prep, cleaning, laundry, driving, and community activity  PERSONAL FACTORS: 3+ comorbidities: see history above  are also affecting patient's functional outcome.   REHAB POTENTIAL: Good  CLINICAL DECISION MAKING: Stable/uncomplicated  EVALUATION COMPLEXITY: Low   GOALS: Goals reviewed with patient? Yes  SHORT TERM GOALS: (target date for Short term goals are 3 weeks 10/13/22)   1.  Patient will demonstrate independent use of home exercise program to maintain progress from in clinic treatments.  Goal status: Met 09/25/2022  LONG TERM GOALS: (target dates for all long term goals are 12 weeks  12/15/22 )   1. Patient will demonstrate/report pain at worst less than or equal to 2/10 to facilitate minimal limitation in daily activity  secondary to pain symptoms.  Goal status: On Going 09/27/2022   2. Patient will demonstrate independent use of home exercise program to facilitate ability to maintain/progress functional gains from skilled physical therapy services.  Goal status: On Going 09/27/2022   3. Patient will demonstrate FOTO outcome > or = 60 % to indicate reduced disability due to condition.  Goal status: New   4.  Patient will demonstrate left LE MMT 5/5 throughout to faciltiate usual transfers, stairs, squatting at [facility] for daily life.   Goal status: New   5.  Patient will demonstrate 5 time sit to stand: </= 13 seconds with no UE support Goal status: New   6.  Pt will be able to navigate 4-5 steps with single hand rail with step over step gait pattern.  Goal status: New  7. Pt will be able to perform left knee active ROM from 4 to 115 degrees for improved functional mobility.    Goal status: On Going 09/27/2022    PLAN:  PT FREQUENCY: 1-2x/week  PT DURATION: 10 weeks  PLANNED INTERVENTIONS: Therapeutic exercises, Therapeutic activity, Neuro Muscular re-education, Balance training, Gait training, Patient/Family education, Joint mobilization, Stair training, DME instructions, Dry Needling, Electrical stimulation, Traction, Cryotherapy, vasopneumatic deviceMoist heat, Taping, Ultrasound, Ionotophoresis 4mg /ml Dexamethasone, and aquatic therapy, Manual therapy.  All included unless contraindicated  PLAN FOR NEXT SESSION: Progressive mobility gains, strengthening continued.    Chyrel Masson, PT, DPT, OCS, ATC 10/03/22  3:21 PM

## 2022-10-05 ENCOUNTER — Ambulatory Visit: Payer: Medicare HMO | Admitting: Physical Therapy

## 2022-10-05 ENCOUNTER — Encounter: Payer: Medicare HMO | Admitting: Rehabilitative and Restorative Service Providers"

## 2022-10-05 ENCOUNTER — Encounter: Payer: Self-pay | Admitting: Physical Therapy

## 2022-10-05 DIAGNOSIS — R600 Localized edema: Secondary | ICD-10-CM

## 2022-10-05 DIAGNOSIS — M25662 Stiffness of left knee, not elsewhere classified: Secondary | ICD-10-CM

## 2022-10-05 DIAGNOSIS — M25562 Pain in left knee: Secondary | ICD-10-CM

## 2022-10-05 DIAGNOSIS — R262 Difficulty in walking, not elsewhere classified: Secondary | ICD-10-CM

## 2022-10-05 NOTE — Therapy (Signed)
OUTPATIENT PHYSICAL THERAPY TREATMENT   Patient Name: Eddie Young MRN: [id number] DOB:17-Jul-1945, 77 y.o., male Today's Date: 10/05/2022  END OF SESSION:  PT End of Session - 10/05/22 1107     Visit Number 8    Number of Visits 24    Date for PT Re-Evaluation 12/15/22    Progress Note Due on Visit 10    PT Start Time 1100    PT Stop Time 1155    PT Time Calculation (min) 55 min    Activity Tolerance Patient tolerated treatment well    Behavior During Therapy WFL for tasks assessed/performed               Past Medical History:  Diagnosis Date   Arthritis    GERD (gastroesophageal reflux disease)    Hyperlipidemia    Hypothyroidism    Macular degeneration    Thyroid disease    hyper active   Past Surgical History:  Procedure Laterality Date   ANKLE ARTHROSCOPY WITH REPAIR SUBLUXING TENDON     KNEE CARTILAGE SURGERY     SHOULDER ARTHROSCOPY W/ ROTATOR CUFF REPAIR     TOTAL KNEE ARTHROPLASTY Left 09/01/2022   Procedure: LEFT TOTAL KNEE ARTHROPLASTY;  Surgeon: Kathryne Hitch, MD;  Location: [facility];  Service: Orthopedics;  Laterality: Left;   Patient Active Problem List   Diagnosis Date Noted   Status post total left knee replacement 09/01/2022   Unilateral primary osteoarthritis, right knee 06/21/2022   Unilateral primary osteoarthritis, left knee 09/13/2021   AC (acromioclavicular) arthritis 08/26/2019   Impingement syndrome of left shoulder 08/26/2019   Biceps tendonitis on left 08/26/2019   Pain in left shoulder 10/28/2018    PCP: Gwenlyn Found, MD   REFERRING PROVIDER: Kathryne Hitch*   REFERRING DIAG:  Diagnosis  [phone] (ICD-10-CM) - Status post total left knee replacement    THERAPY DIAG:  Acute pain of left knee  Stiffness of left knee, not elsewhere classified  Difficulty in walking, not elsewhere classified  Localized edema  Rationale for Evaluation and Treatment: Rehabilitation  ONSET DATE: left TKA on  09/01/22  SUBJECTIVE:   SUBJECTIVE STATEMENT: Doing pretty well overall; no pain  PERTINENT HISTORY: Arthritis, GERD, thyroid disease, macular degeneration, hyperlipidemia, ankle arthroscopy, knee cartilage surgery, shoulder arthroscopy with rotator cuff repair, TKA on left 09/01/2022  PAIN:  NPRS scale: upon arrival 0/10 Pain location: left knee and lateral thigh Pain description: achy Aggravating factors: exercises, walking Relieving factors: resting, muscle relaxer, pain meds, ice  PRECAUTIONS: None  WEIGHT BEARING RESTRICTIONS: No WBAT  FALLS:  Has patient fallen in last 6 months? Yes, tripped over rope in yard while he was having work done.   LIVING ENVIRONMENT: Lives with: lives with their family and lives with their spouse Lives in: House/apartment Stairs: Yes: External: 3 steps; bilateral but cannot reach both Has following equipment at home: Dan Humphreys - 2 wheeled  OCCUPATION: retired  PLOF: Independent  PATIENT GOALS: walk without device  OBJECTIVE:   DIAGNOSTIC FINDINGS: IMPRESSION: Left knee arthroplasty without immediate postoperative complication.  PATIENT SURVEYS:  09/18/22  FOTO intake:    39%   COGNITION: Overall cognitive status: WFL    SENSATION: 09/18/22 WFL  EDEMA:    09/18/22 Circumferential: Rt    LOWER EXTREMITY ROM:   ROM Right 09/18/22 supine Left 09/18/22 supine Left 09/22/2022 Left 09/23/2022 Left 09/25/2022 Left  09/27/2022 Left 10/05/22  Knee flexion A: 124 A: 72 P: 75 With belt 82 Active 83 Active 88 Active  92 A: 100 P:107  Knee extension A: 0 A: -15 P: -12 Active -15 Active -10 Active -10 Active -9 A: -3 (seated LAQ)   (Blank rows = not tested)  LOWER EXTREMITY STRENGTH:  MMT Right 09/18/22 Left 09/18/22  Hip flexion 5 4  Knee flexion 5 3  Knee extension 5 3   (Blank rows = not tested)    FUNCTIONAL TESTS:  09/18/22: 5 time sit to stand: 21 seconds with UE support  GAIT: Distance walked: 30 feet clinic distance, level  surface Assistive device utilized: Environmental consultant - 2 wheeled Level of assistance: Modified independence Comments: increased knee flexion on the left                                                                                                                                                                        TODAY'S TREATMENT 09/25/22 TherEx Recumbent bike x 8 min; partial revolutions at seat 10 Leg Press 100# bil 2x15 with 45 sec flexion hold between sets; LLE only 50# x 15 reps Incline board stretch 3x30 sec Seated Lt SLR 2x10 Supine AA heel slides x 10 reps ROM measurements - see above for details Lt heel prop x 5 min with vaso  Modalities Vaso to Lt knee x 10 min; mod pressure, 34 deg   10/03/2022 Therex: Recumbent bike partial circles 6 mins seat 10 Incline gastroc stretch 30 sec x 3 bilaterally  Discussed frequency of HEP with cues for small amounts at a time but routine during the day.  Continued emphasis on TKE stretching as well c heel prop options with ice.   Supine TKE stretch in start of vaso 5 mins  TherActivity (to improve ambulation, transfers, stair navigation) Step up forward with backward step off WB on Lt leg no UE assist 4 inch step x 15 - cues for limiting hip circumduction Lateral step down WB on Lt leg 4 inch step x 15 (cues to touch foot flat) Leg press double leg 100 lbs x 15 with pauses into flexion stretch, single leg 50 lbs 2 x 15  Vaso pneumatic  Lt knee 10 mins medium 34 degrees in elevaiton.   TODAY'S TREATMENT                                                                          DATE: 09/28/2022 Recumbent bike Seat 10 AAROM for 5 minutes Prone knee extension stretch with rolled up pillows above knees 3 minutes with  3# IT Band stretch 2 x 20 seconds left only (hamstrings stretch with adduction and IR while supine)  HEP only: -Tailgate knee flexion 1 minute -Seated AAROM knee flexion 10 x 10 seconds, PT Overpressure on last 5 -Seated straight  leg raises 2 sets of 10 for 3 seconds (pause before lifting 3-4 inches)  Functional Activities: Double Leg Press 100# 10 x slow eccentrics  Single Leg Press Left only 50# 10 x slow eccentrics Step-up and over 4 and 6 inch step 6 x each slow eccentrics Step-down off 4 and 6 inch step 10 x each slow eccentrics Dynamic tandem balance 4 laps  Neuromuscular re-education: Tandem balance 4 x 20 seconds  Vaso left knee for 10 minutes Medium 34*   TODAY'S TREATMENT                                                                          DATE:09/27/2022 Recumbent bike Seat 10 AAROM for 5 minutes Tailgate knee flexion 1 minute Seated AAROM knee flexion 10 x 10 seconds, PT Overpressure on last 5 Seated straight leg raises 2 sets of 10 for 3 seconds (pause before lifting 3-4 inches) Prone knee extension stretch with rolled up pillows above knees 3 minutes with 1#  Functional Activities: Double Leg Press 100# 10 x slow eccentrics  Single Leg Press Left only 50# 10 x slow eccentrics  Vaso left knee for 10 minutes Medium 34*   PATIENT EDUCATION:  Education details: HEP, POC Person educated: Patient Education method: Programmer, multimedia, Demonstration, Verbal cues, and Handouts Education comprehension: verbalized understanding, returned demonstration, and verbal cues required  HOME EXERCISE PROGRAM: Access Code: [email or web address] URL: [email or web address] Date: 09/18/2022 Prepared by: Narda Amber  Exercises - Long Sitting Quad Set with Towel Roll Under Heel  - 3 x daily - 7 x weekly - 2 sets - 10 reps - 5 seconds hold - Supine Active Straight Leg Raise  - 3 x daily - 7 x weekly - 2 sets - 10 reps - Supine Heel Slide with Strap  - 3 x daily - 7 x weekly - 10 reps - 5 seconds hold - Sit to Stand with Armchair  - 3 x daily - 7 x weekly - 2 sets - 10 reps - Seated Heel Slide  - 3 x daily - 7 x weekly - 2 sets - 10 reps  ASSESSMENT:  CLINICAL IMPRESSION: Good improvement in AROM noted  today and overall progressing well with PT.  Will continue to benefit from PT to maximize function.   OBJECTIVE IMPAIRMENTS: decreased activity tolerance, decreased balance, decreased mobility, difficulty walking, decreased ROM, decreased strength, increased edema, and pain.   ACTIVITY LIMITATIONS: lifting, bending, sitting, standing, squatting, stairs, transfers, bed mobility, and dressing  PARTICIPATION LIMITATIONS: meal prep, cleaning, laundry, driving, and community activity  PERSONAL FACTORS: 3+ comorbidities: see history above  are also affecting patient's functional outcome.   REHAB POTENTIAL: Good  CLINICAL DECISION MAKING: Stable/uncomplicated  EVALUATION COMPLEXITY: Low   GOALS: Goals reviewed with patient? Yes  SHORT TERM GOALS: (target date for Short term goals are 3 weeks 10/13/22)   1.  Patient will demonstrate independent use of home exercise program to maintain progress from in clinic treatments. Goal status: Met 09/25/2022  LONG TERM GOALS: (target dates for all long term goals are 12 weeks  12/15/22 )   1. Patient will demonstrate/report pain at worst less than or equal to 2/10 to facilitate minimal limitation in daily activity secondary to pain symptoms.  Goal status: On Going 09/27/2022   2. Patient will demonstrate independent use of home exercise program to facilitate ability to maintain/progress functional gains from skilled physical therapy services.  Goal status: On Going 09/27/2022   3. Patient will demonstrate FOTO outcome > or = 60 % to indicate reduced disability due to condition.  Goal status: New   4.  Patient will demonstrate left LE MMT 5/5 throughout to faciltiate usual transfers, stairs, squatting at [facility] for daily life.   Goal status: New   5.  Patient will demonstrate 5 time sit to stand: </= 13 seconds with no UE support Goal status: New   6.  Pt will be able to navigate 4-5 steps with single hand rail with step over step gait pattern.   Goal status: New  7. Pt will be able to perform left knee active ROM from 4 to 115 degrees for improved functional mobility.   Goal status: On Going 09/27/2022    PLAN:  PT FREQUENCY: 1-2x/week  PT DURATION: 10 weeks  PLANNED INTERVENTIONS: Therapeutic exercises, Therapeutic activity, Neuro Muscular re-education, Balance training, Gait training, Patient/Family education, Joint mobilization, Stair training, DME instructions, Dry Needling, Electrical stimulation, Traction, Cryotherapy, vasopneumatic deviceMoist heat, Taping, Ultrasound, Ionotophoresis 4mg /ml Dexamethasone, and aquatic therapy, Manual therapy.  All included unless contraindicated  PLAN FOR NEXT SESSION: continue to maximize motion, balance and gait without AD, strengthening continued.    Clarita Crane, PT, DPT 10/05/22 12:10 PM

## 2022-10-09 ENCOUNTER — Ambulatory Visit: Payer: Medicare HMO | Admitting: Physical Therapy

## 2022-10-09 ENCOUNTER — Encounter: Payer: Self-pay | Admitting: Physical Therapy

## 2022-10-09 DIAGNOSIS — R600 Localized edema: Secondary | ICD-10-CM | POA: Diagnosis not present

## 2022-10-09 DIAGNOSIS — R262 Difficulty in walking, not elsewhere classified: Secondary | ICD-10-CM

## 2022-10-09 DIAGNOSIS — M25662 Stiffness of left knee, not elsewhere classified: Secondary | ICD-10-CM | POA: Diagnosis not present

## 2022-10-09 DIAGNOSIS — M25562 Pain in left knee: Secondary | ICD-10-CM | POA: Diagnosis not present

## 2022-10-09 NOTE — Therapy (Signed)
OUTPATIENT PHYSICAL THERAPY TREATMENT   Patient Name: Eddie Young MRN: [id number] DOB:28-Nov-1945, 77 y.o., male Today's Date: 10/09/2022  END OF SESSION:  PT End of Session - 10/09/22 1248     Visit Number 9    Number of Visits 24    Date for PT Re-Evaluation 12/15/22    Progress Note Due on Visit 10    PT Start Time 1148    PT Stop Time 1238    PT Time Calculation (min) 50 min    Activity Tolerance Patient tolerated treatment well    Behavior During Therapy WFL for tasks assessed/performed                Past Medical History:  Diagnosis Date   Arthritis    GERD (gastroesophageal reflux disease)    Hyperlipidemia    Hypothyroidism    Macular degeneration    Thyroid disease    hyper active   Past Surgical History:  Procedure Laterality Date   ANKLE ARTHROSCOPY WITH REPAIR SUBLUXING TENDON     KNEE CARTILAGE SURGERY     SHOULDER ARTHROSCOPY W/ ROTATOR CUFF REPAIR     TOTAL KNEE ARTHROPLASTY Left 09/01/2022   Procedure: LEFT TOTAL KNEE ARTHROPLASTY;  Surgeon: Kathryne Hitch, MD;  Location: [facility];  Service: Orthopedics;  Laterality: Left;   Patient Active Problem List   Diagnosis Date Noted   Status post total left knee replacement 09/01/2022   Unilateral primary osteoarthritis, right knee 06/21/2022   Unilateral primary osteoarthritis, left knee 09/13/2021   AC (acromioclavicular) arthritis 08/26/2019   Impingement syndrome of left shoulder 08/26/2019   Biceps tendonitis on left 08/26/2019   Pain in left shoulder 10/28/2018    PCP: Gwenlyn Found, MD   REFERRING PROVIDER: Kathryne Hitch*   REFERRING DIAG:  Diagnosis  [phone] (ICD-10-CM) - Status post total left knee replacement    THERAPY DIAG:  Acute pain of left knee  Stiffness of left knee, not elsewhere classified  Difficulty in walking, not elsewhere classified  Localized edema  Rationale for Evaluation and Treatment: Rehabilitation  ONSET DATE: left TKA on  09/01/22  SUBJECTIVE:   SUBJECTIVE STATEMENT: Pt reporting some stiffness in his left knee. No pain upon arrival.   PERTINENT HISTORY: Arthritis, GERD, thyroid disease, macular degeneration, hyperlipidemia, ankle arthroscopy, knee cartilage surgery, shoulder arthroscopy with rotator cuff repair, TKA on left 09/01/2022  PAIN:  NPRS scale: upon arrival 0/10 Pain location: left knee and lateral thigh Pain description: achy Aggravating factors: exercises, walking Relieving factors: resting, muscle relaxer, pain meds, ice  PRECAUTIONS: None  WEIGHT BEARING RESTRICTIONS: No WBAT  FALLS:  Has patient fallen in last 6 months? Yes, tripped over rope in yard while he was having work done.   LIVING ENVIRONMENT: Lives with: lives with their family and lives with their spouse Lives in: House/apartment Stairs: Yes: External: 3 steps; bilateral but cannot reach both Has following equipment at home: Dan Humphreys - 2 wheeled  OCCUPATION: retired  PLOF: Independent  PATIENT GOALS: walk without device  OBJECTIVE:   DIAGNOSTIC FINDINGS: IMPRESSION: Left knee arthroplasty without immediate postoperative complication.  PATIENT SURVEYS:  09/18/22  FOTO intake:    39%   COGNITION: Overall cognitive status: WFL    SENSATION: 09/18/22 WFL  EDEMA:  10/09/22: Left knee: 47 centimeters    LOWER EXTREMITY ROM:   ROM Right 09/18/22 supine Left 09/18/22 supine Left 09/22/2022 Left 09/23/2022 Left 09/25/2022 Left  09/27/2022 Left 10/05/22 Left 10/09/22   Knee flexion A: 124 A:  72 P: 75 With belt 82 Active 83 Active 88 Active 92 A: 100 P:107 A: 104 P: 108 supine  Knee extension A: 0 A: -15 P: -12 Active -15 Active -10 Active -10 Active -9 A: -3 (seated LAQ) A: -4 supine   (Blank rows = not tested)  LOWER EXTREMITY STRENGTH:  MMT Right 09/18/22 Left 09/18/22  Hip flexion 5 4  Knee flexion 5 3  Knee extension 5 3   (Blank rows = not tested)    FUNCTIONAL TESTS:  09/18/22: 5 time sit to  stand: 21 seconds with UE support 10/09/22: 5 time sit to stand: 16.09 seconds with UE support, 14.2 sec  GAIT: Distance walked: 30 feet clinic distance, level surface Assistive device utilized: Environmental consultant - 2 wheeled Level of assistance: Modified independence Comments: increased knee flexion on the left                                                                                                                                                                       TODAY'S TREATMENT 10/09/22 TherEx Recumbent bike x 8 min; partial revolutions at seat 11 and progressing to full revolution Leg Press 100# bil 2x15 with 45 sec flexion hold between sets; LLE only 50# x 15 reps Incline board stretch 3x30 sec Step ups on 8 inch x 2, pt having difficulty, step lowered to 6 inch x 15 leading with left LE with intermittent UE support Seated Lt SLR 2x10 Sit to stand x 10 c UE support ROM measurements - see above for details Modalities Vaso to Lt knee x 10 min; mod pressure, 34 deg    TODAY'S TREATMENT 09/25/22 TherEx Recumbent bike x 8 min; partial revolutions at seat 10 Leg Press 100# bil 2x15 with 45 sec flexion hold between sets; LLE only 50# x 15 reps Incline board stretch 3x30 sec Seated Lt SLR 2x10 Supine AA heel slides x 10 reps ROM measurements - see above for details Lt heel prop x 5 min with vaso  Modalities Vaso to Lt knee x 10 min; mod pressure, 34 deg   10/03/2022 Therex: Recumbent bike partial circles 6 mins seat 10 Incline gastroc stretch 30 sec x 3 bilaterally  Discussed frequency of HEP with cues for small amounts at a time but routine during the day.  Continued emphasis on TKE stretching as well c heel prop options with ice.   Supine TKE stretch in start of vaso 5 mins  TherActivity (to improve ambulation, transfers, stair navigation) Step up forward with backward step off WB on Lt leg no UE assist 4 inch step x 15 - cues for limiting hip circumduction Lateral step  down WB on Lt leg 4 inch step x 15 (cues to touch foot flat)  Leg press double leg 100 lbs x 15 with pauses into flexion stretch, single leg 50 lbs 2 x 15  Vaso pneumatic  Lt knee 10 mins medium 34 degrees in elevaiton.   TODAY'S TREATMENT                                                                          DATE: 09/28/2022 Recumbent bike Seat 10 AAROM for 5 minutes Prone knee extension stretch with rolled up pillows above knees 3 minutes with 3# IT Band stretch 2 x 20 seconds left only (hamstrings stretch with adduction and IR while supine)  HEP only: -Tailgate knee flexion 1 minute -Seated AAROM knee flexion 10 x 10 seconds, PT Overpressure on last 5 -Seated straight leg raises 2 sets of 10 for 3 seconds (pause before lifting 3-4 inches)  Functional Activities: Double Leg Press 100# 10 x slow eccentrics  Single Leg Press Left only 50# 10 x slow eccentrics Step-up and over 4 and 6 inch step 6 x each slow eccentrics Step-down off 4 and 6 inch step 10 x each slow eccentrics Dynamic tandem balance 4 laps  Neuromuscular re-education: Tandem balance 4 x 20 seconds  Vaso left knee for 10 minutes Medium 34*   TODAY'S TREATMENT                                                                          DATE:09/27/2022 Recumbent bike Seat 10 AAROM for 5 minutes Tailgate knee flexion 1 minute Seated AAROM knee flexion 10 x 10 seconds, PT Overpressure on last 5 Seated straight leg raises 2 sets of 10 for 3 seconds (pause before lifting 3-4 inches) Prone knee extension stretch with rolled up pillows above knees 3 minutes with 1#  Functional Activities: Double Leg Press 100# 10 x slow eccentrics  Single Leg Press Left only 50# 10 x slow eccentrics  Vaso left knee for 10 minutes Medium 34*   PATIENT EDUCATION:  Education details: HEP, POC Person educated: Patient Education method: Programmer, multimedia, Demonstration, Verbal cues, and Handouts Education comprehension: verbalized understanding,  returned demonstration, and verbal cues required  HOME EXERCISE PROGRAM: Access Code: [email or web address] URL: [email or web address] Date: 09/18/2022 Prepared by: Narda Amber  Exercises - Long Sitting Quad Set with Towel Roll Under Heel  - 3 x daily - 7 x weekly - 2 sets - 10 reps - 5 seconds hold - Supine Active Straight Leg Raise  - 3 x daily - 7 x weekly - 2 sets - 10 reps - Supine Heel Slide with Strap  - 3 x daily - 7 x weekly - 10 reps - 5 seconds hold - Sit to Stand with Armchair  - 3 x daily - 7 x weekly - 2 sets - 10 reps - Seated Heel Slide  - 3 x daily - 7 x weekly - 2 sets - 10 reps  ASSESSMENT:  CLINICAL IMPRESSION: Pt continues to progress both ROM and functional mobility. Pt's current active  arc of motion is 10-102 degrees. Pt's passive arc of motion is 5 to 108 degrees. Continue skilled PT interventions to maximize function.    OBJECTIVE IMPAIRMENTS: decreased activity tolerance, decreased balance, decreased mobility, difficulty walking, decreased ROM, decreased strength, increased edema, and pain.   ACTIVITY LIMITATIONS: lifting, bending, sitting, standing, squatting, stairs, transfers, bed mobility, and dressing  PARTICIPATION LIMITATIONS: meal prep, cleaning, laundry, driving, and community activity  PERSONAL FACTORS: 3+ comorbidities: see history above  are also affecting patient's functional outcome.   REHAB POTENTIAL: Good  CLINICAL DECISION MAKING: Stable/uncomplicated  EVALUATION COMPLEXITY: Low   GOALS: Goals reviewed with patient? Yes  SHORT TERM GOALS: (target date for Short term goals are 3 weeks 10/13/22)   1.  Patient will demonstrate independent use of home exercise program to maintain progress from in clinic treatments. Goal status: Met 09/25/2022  LONG TERM GOALS: (target dates for all long term goals are 12 weeks  12/15/22 )   1. Patient will demonstrate/report pain at worst less than or equal to 2/10 to facilitate minimal limitation  in daily activity secondary to pain symptoms.  Goal status: On Going 09/27/2022   2. Patient will demonstrate independent use of home exercise program to facilitate ability to maintain/progress functional gains from skilled physical therapy services.  Goal status: On Going 09/27/2022   3. Patient will demonstrate FOTO outcome > or = 60 % to indicate reduced disability due to condition.  Goal status: New   4.  Patient will demonstrate left LE MMT 5/5 throughout to faciltiate usual transfers, stairs, squatting at [facility] for daily life.   Goal status: New   5.  Patient will demonstrate 5 time sit to stand: </= 13 seconds with no UE support Goal status: New   6.  Pt will be able to navigate 4-5 steps with single hand rail with step over step gait pattern.  Goal status: New  7. Pt will be able to perform left knee active ROM from 4 to 115 degrees for improved functional mobility.   Goal status: On Going 09/27/2022    PLAN:  PT FREQUENCY: 1-2x/week  PT DURATION: 10 weeks  PLANNED INTERVENTIONS: Therapeutic exercises, Therapeutic activity, Neuro Muscular re-education, Balance training, Gait training, Patient/Family education, Joint mobilization, Stair training, DME instructions, Dry Needling, Electrical stimulation, Traction, Cryotherapy, vasopneumatic deviceMoist heat, Taping, Ultrasound, Ionotophoresis 4mg /ml Dexamethasone, and aquatic therapy, Manual therapy.  All included unless contraindicated  PLAN FOR NEXT SESSION: continue to maximize motion, balance and gait without AD, strengthening continued.    Narda Amber, PT, MPT 10/09/22 12:50 PM   10/09/22 12:50 PM

## 2022-10-11 ENCOUNTER — Ambulatory Visit: Payer: Medicare HMO | Admitting: Physical Therapy

## 2022-10-11 ENCOUNTER — Encounter: Payer: Self-pay | Admitting: Physical Therapy

## 2022-10-11 DIAGNOSIS — R262 Difficulty in walking, not elsewhere classified: Secondary | ICD-10-CM | POA: Diagnosis not present

## 2022-10-11 DIAGNOSIS — R600 Localized edema: Secondary | ICD-10-CM

## 2022-10-11 DIAGNOSIS — M25662 Stiffness of left knee, not elsewhere classified: Secondary | ICD-10-CM | POA: Diagnosis not present

## 2022-10-11 DIAGNOSIS — M25562 Pain in left knee: Secondary | ICD-10-CM | POA: Diagnosis not present

## 2022-10-11 NOTE — Therapy (Signed)
OUTPATIENT PHYSICAL THERAPY TREATMENT PROGRESS NOTE   Patient Name: Eddie Young MRN: [id number] DOB:[birth year], 77 y.o., male Today's Date: 10/11/2022  Progress Note Reporting Period 09/18/22 to 10/11/22  See note below for Objective Data and Assessment of Progress/Goals.      END OF SESSION:  PT End of Session - 10/11/22 1044     Visit Number 10    Number of Visits 24    Date for PT Re-Evaluation 12/15/22    Progress Note Due on Visit 20    PT Start Time 1018    PT Stop Time 1106    PT Time Calculation (min) 48 min    Activity Tolerance Patient tolerated treatment well    Behavior During Therapy WFL for tasks assessed/performed                Past Medical History:  Diagnosis Date   Arthritis    GERD (gastroesophageal reflux disease)    Hyperlipidemia    Hypothyroidism    Macular degeneration    Thyroid disease    hyper active   Past Surgical History:  Procedure Laterality Date   ANKLE ARTHROSCOPY WITH REPAIR SUBLUXING TENDON     KNEE CARTILAGE SURGERY     SHOULDER ARTHROSCOPY W/ ROTATOR CUFF REPAIR     TOTAL KNEE ARTHROPLASTY Left 09/01/2022   Procedure: LEFT TOTAL KNEE ARTHROPLASTY;  Surgeon: Kathryne Hitch, MD;  Location: [facility];  Service: Orthopedics;  Laterality: Left;   Patient Active Problem List   Diagnosis Date Noted   Status post total left knee replacement 09/01/2022   Unilateral primary osteoarthritis, right knee 06/21/2022   Unilateral primary osteoarthritis, left knee 09/13/2021   AC (acromioclavicular) arthritis 08/26/2019   Impingement syndrome of left shoulder 08/26/2019   Biceps tendonitis on left 08/26/2019   Pain in left shoulder 10/28/2018    PCP: Gwenlyn Found, MD   REFERRING PROVIDER: Kathryne Hitch*   REFERRING DIAG:  Diagnosis  [phone] (ICD-10-CM) - Status post total left knee replacement    THERAPY DIAG:  Acute pain of left knee  Stiffness of left knee, not elsewhere  classified  Difficulty in walking, not elsewhere classified  Localized edema  Rationale for Evaluation and Treatment: Rehabilitation  ONSET DATE: left TKA on 09/01/22  SUBJECTIVE:   SUBJECTIVE STATEMENT: Pt reporting no pain at rest. Pt reporting itchiness and dryness around his left knee.   PERTINENT HISTORY: Arthritis, GERD, thyroid disease, macular degeneration, hyperlipidemia, ankle arthroscopy, knee cartilage surgery, shoulder arthroscopy with rotator cuff repair, TKA on left 09/01/2022  PAIN:  NPRS scale: upon arrival 0/10 Pain location: left knee and lateral thigh Pain description: achy Aggravating factors: exercises, walking Relieving factors: resting, muscle relaxer, pain meds, ice  PRECAUTIONS: None  WEIGHT BEARING RESTRICTIONS: No WBAT  FALLS:  Has patient fallen in last 6 months? Yes, tripped over rope in yard while he was having work done.   LIVING ENVIRONMENT: Lives with: lives with their family and lives with their spouse Lives in: House/apartment Stairs: Yes: External: 3 steps; bilateral but cannot reach both Has following equipment at home: Dan Humphreys - 2 wheeled  OCCUPATION: retired  PLOF: Independent  PATIENT GOALS: walk without device  OBJECTIVE:   DIAGNOSTIC FINDINGS: IMPRESSION: Left knee arthroplasty without immediate postoperative complication.  PATIENT SURVEYS:  09/18/22  FOTO intake:    39%  10/11/22: FOTO Update 55%  COGNITION: Overall cognitive status: WFL    SENSATION: 09/18/22 WFL  EDEMA:  10/09/22: Left knee: 47 centimeters  LOWER EXTREMITY ROM:   ROM Right 09/18/22 supine Left 09/18/22 supine Left 09/22/2022 Left 09/23/2022 Left 09/25/2022 Left  09/27/2022 Left 10/05/22 Left 10/09/22  Left 10/11/22 Supine  Knee flexion A: 124 A: 72 P: 75 With belt 82 Active 83 Active 88 Active 92 A: 100 P:107 A: 104 P: 108 supine A: 105 P: 105  Knee extension A: 0 A: -15 P: -12 Active -15 Active -10 Active -10 Active -9 A: -3 (seated LAQ)  A: -4 supine A: -5   (Blank rows = not tested)  LOWER EXTREMITY STRENGTH:  MMT Right 09/18/22 Left 09/18/22  Hip flexion 5 4  Knee flexion 5 3  Knee extension 5 3   (Blank rows = not tested)    FUNCTIONAL TESTS:  09/18/22: 5 time sit to stand: 21 seconds with UE support 10/09/22: 5 time sit to stand: 16.09 seconds with UE support, 14.2 sec  GAIT: Distance walked: 30 feet clinic distance, level surface Assistive device utilized: Environmental consultant - 2 wheeled Level of assistance: Modified independence Comments: increased knee flexion on the left                                                                                                                                                                       TODAY'S TREATMENT 10/2 /24 TherEx Recumbent bike x 8 min; partial revolutions at seat 11 and progressing to full revolution Leg Press 100# bil 2x15 with 45 sec flexion hold between sets; LLE only 50# x 15 reps Incline board stretch 3x30 sec Step ups on 8 inch x 2, pt having difficulty, step lowered to 6 inch x 15 leading with left LE with intermittent UE support Seated Lt SLR 2x10 Sit to stand x 10 c UE support ROM measurements - see above for details Modalities Vaso to Lt knee x 10 min; mod pressure, 34 deg Manual:  Pt's left knee was washed using water and massage cream was placed around knee to help with dryness. It was not placed over incision site   TODAY'S TREATMENT 10/09/22 TherEx Recumbent bike x 8 min; partial revolutions at seat 11 and progressing to full revolution Leg Press 100# bil 2x15 with 45 sec flexion hold between sets; LLE only 50# x 15 reps Incline board stretch 3x30 sec Step ups on 8 inch x 2, pt having difficulty, step lowered to 6 inch x 15 leading with left LE with intermittent UE support Seated Lt SLR 2x10 Sit to stand x 10 c UE support ROM measurements - see above for details Modalities Vaso to Lt knee x 10 min; mod pressure, 34 deg    TODAY'S  TREATMENT 09/25/22 TherEx Recumbent bike x 8 min; partial revolutions at seat 10 Leg Press 100# bil 2x15 with  45 sec flexion hold between sets; LLE only 50# x 15 reps Incline board stretch 3x30 sec Seated Lt SLR 2x10 Supine AA heel slides x 10 reps ROM measurements - see above for details Lt heel prop x 5 min with vaso  Modalities Vaso to Lt knee x 10 min; mod pressure, 34 deg   10/03/2022 Therex: Recumbent bike partial circles 6 mins seat 10 Incline gastroc stretch 30 sec x 3 bilaterally  Discussed frequency of HEP with cues for small amounts at a time but routine during the day.  Continued emphasis on TKE stretching as well c heel prop options with ice.   Supine TKE stretch in start of vaso 5 mins  TherActivity (to improve ambulation, transfers, stair navigation) Step up forward with backward step off WB on Lt leg no UE assist 4 inch step x 15 - cues for limiting hip circumduction Lateral step down WB on Lt leg 4 inch step x 15 (cues to touch foot flat) Leg press double leg 100 lbs x 15 with pauses into flexion stretch, single leg 50 lbs 2 x 15  Vaso pneumatic  Lt knee 10 mins medium 34 degrees in elevaiton.         PATIENT EDUCATION:  Education details: HEP, POC Person educated: Patient Education method: Programmer, multimedia, Demonstration, Verbal cues, and Handouts Education comprehension: verbalized understanding, returned demonstration, and verbal cues required  HOME EXERCISE PROGRAM: Access Code: [email or web address] URL: [email or web address] Date: 09/18/2022 Prepared by: Narda Amber  Exercises - Long Sitting Quad Set with Towel Roll Under Heel  - 3 x daily - 7 x weekly - 2 sets - 10 reps - 5 seconds hold - Supine Active Straight Leg Raise  - 3 x daily - 7 x weekly - 2 sets - 10 reps - Supine Heel Slide with Strap  - 3 x daily - 7 x weekly - 10 reps - 5 seconds hold - Sit to Stand with Armchair  - 3 x daily - 7 x weekly - 2 sets - 10 reps - Seated Heel Slide   - 3 x daily - 7 x weekly - 2 sets - 10 reps  ASSESSMENT:  CLINICAL IMPRESSION: Pt continues to progress both ROM and functional mobility. Pt's current arc of motion is 5 to 105 degrees today. Continue skilled PT interventions to maximize function and progress functional mobility.    OBJECTIVE IMPAIRMENTS: decreased activity tolerance, decreased balance, decreased mobility, difficulty walking, decreased ROM, decreased strength, increased edema, and pain.   ACTIVITY LIMITATIONS: lifting, bending, sitting, standing, squatting, stairs, transfers, bed mobility, and dressing  PARTICIPATION LIMITATIONS: meal prep, cleaning, laundry, driving, and community activity  PERSONAL FACTORS: 3+ comorbidities: see history above  are also affecting patient's functional outcome.   REHAB POTENTIAL: Good  CLINICAL DECISION MAKING: Stable/uncomplicated  EVALUATION COMPLEXITY: Low   GOALS: Goals reviewed with patient? Yes  SHORT TERM GOALS: (target date for Short term goals are 3 weeks 10/13/22)   1.  Patient will demonstrate independent use of home exercise program to maintain progress from in clinic treatments. Goal status: Met 09/25/2022  LONG TERM GOALS: (target dates for all long term goals are 12 weeks  12/15/22 )   1. Patient will demonstrate/report pain at worst less than or equal to 2/10 to facilitate minimal limitation in daily activity secondary to pain symptoms.  Goal status: On Going 10/11/2022   2. Patient will demonstrate independent use of home exercise program to facilitate ability to maintain/progress functional gains from skilled physical therapy services.  Goal status:  On Going 10/11/2022   3. Patient will demonstrate FOTO outcome > or = 60 % to indicate reduced disability due to condition.  Goal status:  On Going 10/11/2022   4.  Patient will demonstrate left LE MMT 5/5 throughout to faciltiate usual transfers, stairs, squatting at [facility] for daily life.   Goal status:  On Going  10/11/2022   5.  Patient will demonstrate 5 time sit to stand: </= 13 seconds with no UE support Goal status: On Going 10/11/2022   6.  Pt will be able to navigate 4-5 steps with single hand rail with step over step gait pattern.  Goal status: On Going 10/11/2022  7. Pt will be able to perform left knee active ROM from 4 to 115 degrees for improved functional mobility.   Goal status:  On Going 10/11/2022    PLAN:  PT FREQUENCY: 1-2x/week  PT DURATION: 10 weeks  PLANNED INTERVENTIONS: Therapeutic exercises, Therapeutic activity, Neuro Muscular re-education, Balance training, Gait training, Patient/Family education, Joint mobilization, Stair training, DME instructions, Dry Needling, Electrical stimulation, Traction, Cryotherapy, vasopneumatic deviceMoist heat, Taping, Ultrasound, Ionotophoresis 4mg /ml Dexamethasone, and aquatic therapy, Manual therapy.  All included unless contraindicated  PLAN FOR NEXT SESSION: continue to maximize motion, balance and gait without AD, strengthening continued.    Narda Amber, PT, MPT 10/11/22 11:53 AM   10/11/22 11:53 AM

## 2022-10-16 ENCOUNTER — Encounter: Payer: Self-pay | Admitting: Orthopaedic Surgery

## 2022-10-16 ENCOUNTER — Encounter: Payer: Self-pay | Admitting: Physical Therapy

## 2022-10-16 ENCOUNTER — Ambulatory Visit: Payer: Medicare HMO | Admitting: Orthopaedic Surgery

## 2022-10-16 ENCOUNTER — Ambulatory Visit: Payer: Medicare HMO | Admitting: Physical Therapy

## 2022-10-16 DIAGNOSIS — R600 Localized edema: Secondary | ICD-10-CM | POA: Diagnosis not present

## 2022-10-16 DIAGNOSIS — R262 Difficulty in walking, not elsewhere classified: Secondary | ICD-10-CM

## 2022-10-16 DIAGNOSIS — M25562 Pain in left knee: Secondary | ICD-10-CM

## 2022-10-16 DIAGNOSIS — M25662 Stiffness of left knee, not elsewhere classified: Secondary | ICD-10-CM

## 2022-10-16 DIAGNOSIS — Z96652 Presence of left artificial knee joint: Secondary | ICD-10-CM

## 2022-10-16 NOTE — Progress Notes (Signed)
The patient is now just past 6 weeks status post a left total knee replacement to treat severe arthritis of the left knee.  He is an active 77 year old gentleman.  He has been participating in outpatient physical therapy for his left knee in the postoperative period.  He is walking without assistive device.  He is still working diligently with therapy and had a physical therapy session this morning.  He denies any significant pain at all but he does report ankle swelling.  He is only taken Tylenol or tramadol for pain.  On my exam he lacks full extension by just less than 5 degrees.  I pushed him hard and can flex him to about 105 today.  This is good progress.  He knows that the swelling and warmth will take some time to dissipate.  His calf is soft.  I did recommend wearing compressive garments for dealing with the foot and ankle swelling.  From my standpoint we will see him back in another month to see what progress he is making with his range of motion.  I would like a standing AP and lateral of his left knee at that visit.

## 2022-10-16 NOTE — Therapy (Signed)
OUTPATIENT PHYSICAL THERAPY TREATMENT    Patient Name: Eddie Young MRN: [id number] DOB:1945-03-22, 77 y.o., male Today's Date: 10/16/2022   END OF SESSION:  PT End of Session - 10/16/22 1239     Visit Number 11    Number of Visits 24    Date for PT Re-Evaluation 12/15/22    Progress Note Due on Visit 20    PT Start Time 1152    PT Stop Time 1235    PT Time Calculation (min) 43 min    Activity Tolerance Patient tolerated treatment well    Behavior During Therapy WFL for tasks assessed/performed                 Past Medical History:  Diagnosis Date   Arthritis    GERD (gastroesophageal reflux disease)    Hyperlipidemia    Hypothyroidism    Macular degeneration    Thyroid disease    hyper active   Past Surgical History:  Procedure Laterality Date   ANKLE ARTHROSCOPY WITH REPAIR SUBLUXING TENDON     KNEE CARTILAGE SURGERY     SHOULDER ARTHROSCOPY W/ ROTATOR CUFF REPAIR     TOTAL KNEE ARTHROPLASTY Left 09/01/2022   Procedure: LEFT TOTAL KNEE ARTHROPLASTY;  Surgeon: Kathryne Hitch, MD;  Location: [facility];  Service: Orthopedics;  Laterality: Left;   Patient Active Problem List   Diagnosis Date Noted   Status post total left knee replacement 09/01/2022   Unilateral primary osteoarthritis, right knee 06/21/2022   Unilateral primary osteoarthritis, left knee 09/13/2021   AC (acromioclavicular) arthritis 08/26/2019   Impingement syndrome of left shoulder 08/26/2019   Biceps tendonitis on left 08/26/2019   Pain in left shoulder 10/28/2018    PCP: Gwenlyn Found, MD   REFERRING PROVIDER: Kathryne Hitch*   REFERRING DIAG:  Diagnosis  [phone] (ICD-10-CM) - Status post total left knee replacement    THERAPY DIAG:  Acute pain of left knee  Stiffness of left knee, not elsewhere classified  Difficulty in walking, not elsewhere classified  Localized edema  Rationale for Evaluation and Treatment: Rehabilitation  ONSET DATE: left  TKA on 09/01/22  SUBJECTIVE:   SUBJECTIVE STATEMENT: Pt arriving today reporting no pain with ADL's.   PERTINENT HISTORY: Arthritis, GERD, thyroid disease, macular degeneration, hyperlipidemia, ankle arthroscopy, knee cartilage surgery, shoulder arthroscopy with rotator cuff repair, TKA on left 09/01/2022  PAIN:  NPRS scale: upon arrival 0/10 Pain location: left knee and lateral thigh Pain description: achy Aggravating factors: exercises, walking Relieving factors: resting, muscle relaxer, pain meds, ice  PRECAUTIONS: None  WEIGHT BEARING RESTRICTIONS: No WBAT  FALLS:  Has patient fallen in last 6 months? Yes, tripped over rope in yard while he was having work done.   LIVING ENVIRONMENT: Lives with: lives with their family and lives with their spouse Lives in: House/apartment Stairs: Yes: External: 3 steps; bilateral but cannot reach both Has following equipment at home: Dan Humphreys - 2 wheeled  OCCUPATION: retired  PLOF: Independent  PATIENT GOALS: walk without device  OBJECTIVE:   DIAGNOSTIC FINDINGS: IMPRESSION: Left knee arthroplasty without immediate postoperative complication.  PATIENT SURVEYS:  09/18/22  FOTO intake:    39%  10/11/22: FOTO Update 55%  COGNITION: Overall cognitive status: WFL    SENSATION: 09/18/22 WFL  EDEMA:  10/09/22: Left knee: 47 centimeters 10/16/22: Left knee 46.5 centimeters    LOWER EXTREMITY ROM:   ROM Right 09/18/22 supine Left 09/18/22 supine Left 09/22/2022 Left 09/23/2022 Left 09/25/2022 Left  09/27/2022 Left 10/05/22 Left  10/09/22  Left 10/11/22 Supine Left 10/16/22 Supine   Knee flexion A: 124 A: 72 P: 75 With belt 82 Active 83 Active 88 Active 92 A: 100 P:107 A: 104 P: 108 supine A: 105 P: 105 A: 102 P: 105  Knee extension A: 0 A: -15 P: -12 Active -15 Active -10 Active -10 Active -9 A: -3 (seated LAQ) A: -4 supine A: -5 A: -6 P: -4   (Blank rows = not tested)  LOWER EXTREMITY STRENGTH:  MMT Right 09/18/22  Left 09/18/22  Hip flexion 5 4  Knee flexion 5 3  Knee extension 5 3   (Blank rows = not tested)    FUNCTIONAL TESTS:  09/18/22: 5 time sit to stand: 21 seconds with UE support 10/09/22: 5 time sit to stand: 16.09 seconds with UE support, 14.2 sec  GAIT: Distance walked: 30 feet clinic distance, level surface Assistive device utilized: Walker - 2 wheeled Level of assistance: Modified independence Comments: increased knee flexion on the left                                                                                                                                                                       TODAY'S TREATMENT 10/16/22:  TherEx Recumbent bike x 7 min; full revolutions, seat all the way back Leg Press 100# bil 2x15 with 45 sec flexion hold between sets; LLE only 50# x 15 reps, calf raises 31# 2 x 10  Incline board stretch 3x30 sec gastroc, soleus Step down tapping RT LE  x 20  on 2 inch step, pt unable to perform on 4 inch step  due to increased pain Mini squats at TM bar for UE support 2 x 15  Seated SLR: 2 x 15  Supine: quad stretch using strap x 5 holding 20 sec Prone: quad stretch using strap x 3 holding 60 sec ROM measurements - see above for details Edema measurements - see above for details Manual:  Passive ROM left knee flexion Modalities Vaso to Lt knee x 10 min; mod pressure, 34 deg     TODAY'S TREATMENT 10/11/22 TherEx Recumbent bike x 8 min; partial revolutions at seat 11 and progressing to full revolution Leg Press 100# bil 2x15 with 45 sec flexion hold between sets; LLE only 50# x 15 reps Incline board stretch 3x30 sec Step ups on 8 inch x 2, pt having difficulty, step lowered to 6 inch x 15 leading with left LE with intermittent UE support Seated Lt SLR 2x10 Sit to stand x 10 c UE support ROM measurements - see above for details Modalities Vaso to Lt knee x 10 min; mod pressure, 34 deg Manual:  Pt's left knee was washed using water and massage  cream was  placed around knee to help with dryness. It was not placed over incision site   TODAY'S TREATMENT 10/09/22 TherEx Recumbent bike x 8 min; partial revolutions at seat 11 and progressing to full revolution Leg Press 100# bil 2x15 with 45 sec flexion hold between sets; LLE only 50# x 15 reps Incline board stretch 3x30 sec Step ups on 8 inch x 2, pt having difficulty, step lowered to 6 inch x 15 leading with left LE with intermittent UE support Seated Lt SLR 2x10 Sit to stand x 10 c UE support ROM measurements - see above for details Modalities Vaso to Lt knee x 10 min; mod pressure, 34 deg     PATIENT EDUCATION:  Education details: HEP, POC Person educated: Patient Education method: Programmer, multimedia, Demonstration, Verbal cues, and Handouts Education comprehension: verbalized understanding, returned demonstration, and verbal cues required  HOME EXERCISE PROGRAM: Access Code: [email or web address] URL: [email or web address] Date: 09/18/2022 Prepared by: Narda Amber  Exercises - Long Sitting Quad Set with Towel Roll Under Heel  - 3 x daily - 7 x weekly - 2 sets - 10 reps - 5 seconds hold - Supine Active Straight Leg Raise  - 3 x daily - 7 x weekly - 2 sets - 10 reps - Supine Heel Slide with Strap  - 3 x daily - 7 x weekly - 10 reps - 5 seconds hold - Sit to Stand with Armchair  - 3 x daily - 7 x weekly - 2 sets - 10 reps - Seated Heel Slide  - 3 x daily - 7 x weekly - 2 sets - 10 reps  ASSESSMENT:  CLINICAL IMPRESSION: Pt encouraged to push through his plateau of 105 passively knee flexion. Pt instructed to elevate and ice and perform knee flexion stretching exercises throughout the day. Continue with strengthening and ROM and manual skilled PT as needed to maximize pt's function.    OBJECTIVE IMPAIRMENTS: decreased activity tolerance, decreased balance, decreased mobility, difficulty walking, decreased ROM, decreased strength, increased edema, and pain.   ACTIVITY  LIMITATIONS: lifting, bending, sitting, standing, squatting, stairs, transfers, bed mobility, and dressing  PARTICIPATION LIMITATIONS: meal prep, cleaning, laundry, driving, and community activity  PERSONAL FACTORS: 3+ comorbidities: see history above  are also affecting patient's functional outcome.   REHAB POTENTIAL: Good  CLINICAL DECISION MAKING: Stable/uncomplicated  EVALUATION COMPLEXITY: Low   GOALS: Goals reviewed with patient? Yes  SHORT TERM GOALS: (target date for Short term goals are 3 weeks 10/13/22)   1.  Patient will demonstrate independent use of home exercise program to maintain progress from in clinic treatments. Goal status: Met 09/25/2022  LONG TERM GOALS: (target dates for all long term goals are 12 weeks  12/15/22 )   1. Patient will demonstrate/report pain at worst less than or equal to 2/10 to facilitate minimal limitation in daily activity secondary to pain symptoms.  Goal status: On Going 10/16/2022   2. Patient will demonstrate independent use of home exercise program to facilitate ability to maintain/progress functional gains from skilled physical therapy services.  Goal status:  On Going 10/16/2022   3. Patient will demonstrate FOTO outcome > or = 60 % to indicate reduced disability due to condition.  Goal status:  On Going 10/11/2022   4.  Patient will demonstrate left LE MMT 5/5 throughout to faciltiate usual transfers, stairs, squatting at [facility] for daily life.   Goal status:  On Going 10/11/2022   5.  Patient will demonstrate 5 time sit to stand: </= 13 seconds with  no UE support Goal status: On Going 10/11/2022   6.  Pt will be able to navigate 4-5 steps with single hand rail with step over step gait pattern.  Goal status: On Going 10/11/2022  7. Pt will be able to perform left knee active ROM from 4 to 115 degrees for improved functional mobility.   Goal status:  On Going 10/11/2022    PLAN:  PT FREQUENCY: 1-2x/week  PT DURATION: 10  weeks  PLANNED INTERVENTIONS: Therapeutic exercises, Therapeutic activity, Neuro Muscular re-education, Balance training, Gait training, Patient/Family education, Joint mobilization, Stair training, DME instructions, Dry Needling, Electrical stimulation, Traction, Cryotherapy, vasopneumatic deviceMoist heat, Taping, Ultrasound, Ionotophoresis 4mg /ml Dexamethasone, and aquatic therapy, Manual therapy.  All included unless contraindicated  PLAN FOR NEXT SESSION: continue to maximize motion, balance and gait without AD, strengthening continued.  Manual as needed for increased fleixon/extension   Narda Amber, PT, MPT 10/16/22 12:41 PM   10/16/22 12:41 PM

## 2022-10-18 ENCOUNTER — Ambulatory Visit: Payer: Medicare HMO | Admitting: Physical Therapy

## 2022-10-18 ENCOUNTER — Encounter: Payer: Self-pay | Admitting: Physical Therapy

## 2022-10-18 DIAGNOSIS — R600 Localized edema: Secondary | ICD-10-CM | POA: Diagnosis not present

## 2022-10-18 DIAGNOSIS — M25562 Pain in left knee: Secondary | ICD-10-CM | POA: Diagnosis not present

## 2022-10-18 DIAGNOSIS — R262 Difficulty in walking, not elsewhere classified: Secondary | ICD-10-CM | POA: Diagnosis not present

## 2022-10-18 DIAGNOSIS — M25662 Stiffness of left knee, not elsewhere classified: Secondary | ICD-10-CM

## 2022-10-18 NOTE — Therapy (Signed)
OUTPATIENT PHYSICAL THERAPY TREATMENT    Patient Name: Eddie Young MRN: [id number] DOB:1945/12/28, 77 y.o., male Today's Date: 10/18/2022   END OF SESSION:  PT End of Session - 10/18/22 1122     Visit Number 12    Number of Visits 24    Date for PT Re-Evaluation 12/15/22    Progress Note Due on Visit 20    PT Start Time 1104    PT Stop Time 1150    PT Time Calculation (min) 46 min    Activity Tolerance Patient tolerated treatment well    Behavior During Therapy WFL for tasks assessed/performed                 Past Medical History:  Diagnosis Date   Arthritis    GERD (gastroesophageal reflux disease)    Hyperlipidemia    Hypothyroidism    Macular degeneration    Thyroid disease    hyper active   Past Surgical History:  Procedure Laterality Date   ANKLE ARTHROSCOPY WITH REPAIR SUBLUXING TENDON     KNEE CARTILAGE SURGERY     SHOULDER ARTHROSCOPY W/ ROTATOR CUFF REPAIR     TOTAL KNEE ARTHROPLASTY Left 09/01/2022   Procedure: LEFT TOTAL KNEE ARTHROPLASTY;  Surgeon: Kathryne Hitch, MD;  Location: [facility];  Service: Orthopedics;  Laterality: Left;   Patient Active Problem List   Diagnosis Date Noted   Status post total left knee replacement 09/01/2022   Unilateral primary osteoarthritis, right knee 06/21/2022   Unilateral primary osteoarthritis, left knee 09/13/2021   AC (acromioclavicular) arthritis 08/26/2019   Impingement syndrome of left shoulder 08/26/2019   Biceps tendonitis on left 08/26/2019   Pain in left shoulder 10/28/2018    PCP: Gwenlyn Found, MD   REFERRING PROVIDER: Kathryne Hitch*   REFERRING DIAG:  Diagnosis  [id number] (ICD-10-CM) - Status post total left knee replacement    THERAPY DIAG:  Acute pain of left knee  Stiffness of left knee, not elsewhere classified  Difficulty in walking, not elsewhere classified  Localized edema  Rationale for Evaluation and Treatment: Rehabilitation  ONSET DATE: left  TKA on 09/01/22  SUBJECTIVE:   SUBJECTIVE STATEMENT: 10/18/2022  Pt arriving today reporting no pain with ADL's.   PERTINENT HISTORY: Arthritis, GERD, thyroid disease, macular degeneration, hyperlipidemia, ankle arthroscopy, knee cartilage surgery, shoulder arthroscopy with rotator cuff repair, TKA on left 09/01/2022  PAIN:  NPRS scale: upon arrival 0/10 Pain location: left knee and lateral thigh Pain description: achy Aggravating factors: exercises, walking Relieving factors: resting, muscle relaxer, pain meds, ice  PRECAUTIONS: None  WEIGHT BEARING RESTRICTIONS: No WBAT  FALLS:  Has patient fallen in last 6 months? Yes, tripped over rope in yard while he was having work done.   LIVING ENVIRONMENT: Lives with: lives with their family and lives with their spouse Lives in: House/apartment Stairs: Yes: External: 3 steps; bilateral but cannot reach both Has following equipment at home: Dan Humphreys - 2 wheeled  OCCUPATION: retired  PLOF: Independent  PATIENT GOALS: walk without device  OBJECTIVE:   DIAGNOSTIC FINDINGS: IMPRESSION: Left knee arthroplasty without immediate postoperative complication.  PATIENT SURVEYS:  09/18/22  FOTO intake:    39%  10/11/22: FOTO Update 55%  COGNITION: Overall cognitive status: WFL    SENSATION: 09/18/22 WFL  EDEMA:  10/09/22: Left knee: 47 centimeters 10/16/22: Left knee 46.5 centimeters    LOWER EXTREMITY ROM:   ROM Right 09/18/22 supine Left 09/18/22 supine Left 09/22/2022 Left 09/23/2022 Left 09/25/2022 Left  09/27/2022 Left  10/05/22 Left 10/09/22  Left 10/11/22 Supine Left 10/16/22 Supine  Left 10/18/22 Supine  Knee flexion A: 124 A: 72 P: 75 With belt 82 Active 83 Active 88 Active 92 A: 100 P:107 A: 104 P: 108 supine A: 105 P: 105 A: 102 P: 105 A: 105 P: 110  Knee extension A: 0 A: -15 P: -12 Active -15 Active -10 Active -10 Active -9 A: -3 (seated LAQ) A: -4 supine A: -5 A: -6 P: -4 A: -8 P: -5   (Blank rows = not  tested)  LOWER EXTREMITY STRENGTH:  MMT Right 09/18/22 Left 09/18/22  Hip flexion 5 4  Knee flexion 5 3  Knee extension 5 3   (Blank rows = not tested)    FUNCTIONAL TESTS:  09/18/22: 5 time sit to stand: 21 seconds with UE support 10/09/22: 5 time sit to stand: 16.09 seconds with UE support, 14.2 sec  GAIT: Distance walked: 30 feet clinic distance, level surface Assistive device utilized: Environmental consultant - 2 wheeled Level of assistance: Modified independence Comments: increased knee flexion on the left                                                                                                                                                                       TODAY'S TREATMENT 10/18/22:  TherEx Recumbent bike x 10 min; full revolutions, seat all the way back  Leg Press 100# bil 2x15, Left LE only 50# 2 x 15 Step up on 8 inch step c bil UE support Up and down clinic stairs c single hand rail once with CGA, pt needed instruction to allow his left toes to hang over edge of step when stepping down on his Rt LE to assist with flexion and decreased pt's pain.  LAQ: x 15 holding 3-5 sec c 4# Seated AA flexion using contralateral LE for overpressure x 5 holding 20 sec Supine heel slides: x 10 Supine QS with overpressure x 5 holding 5-10 sec ROM measurements - see above for details Manual:  Passive ROM left knee flexion, passive knee extension Modalities Vaso to Lt knee x 10 min; mod pressure, 34 deg     TODAY'S TREATMENT 10/16/22:  TherEx Recumbent bike x 7 min; full revolutions, seat all the way back Leg Press 100# bil 2x15 with 45 sec flexion hold between sets; LLE only 50# x 15 reps, calf raises 31# 2 x 10  Incline board stretch 3x30 sec gastroc, soleus Step down tapping RT LE  x 20  on 2 inch step, pt unable to perform on 4 inch step  due to increased pain Mini squats at TM bar for UE support 2 x 15  Seated SLR: 2 x 15  Supine: quad stretch using strap x  5 holding 20 sec Prone:  quad stretch using strap x 3 holding 60 sec ROM measurements - see above for details Edema measurements - see above for details Manual:  Passive ROM left knee flexion Modalities Vaso to Lt knee x 10 min; mod pressure, 34 deg     TODAY'S TREATMENT 10/11/22 TherEx Recumbent bike x 8 min; partial revolutions at seat 11 and progressing to full revolution Leg Press 100# bil 2x15 with 45 sec flexion hold between sets; LLE only 50# x 15 reps Incline board stretch 3x30 sec Step ups on 8 inch x 2, pt having difficulty, step lowered to 6 inch x 15 leading with left LE with intermittent UE support Seated Lt SLR 2x10 Sit to stand x 10 c UE support ROM measurements - see above for details Modalities Vaso to Lt knee x 10 min; mod pressure, 34 deg Manual:  Pt's left knee was washed using water and massage cream was placed around knee to help with dryness. It was not placed over incision site        PATIENT EDUCATION:  Education details: HEP, POC Person educated: Patient Education method: Explanation, Demonstration, Verbal cues, and Handouts Education comprehension: verbalized understanding, returned demonstration, and verbal cues required  HOME EXERCISE PROGRAM: Access Code: [email or web address] URL: [email or web address] Date: 09/18/2022 Prepared by: Narda Amber  Exercises - Long Sitting Quad Set with Towel Roll Under Heel  - 3 x daily - 7 x weekly - 2 sets - 10 reps - 5 seconds hold - Supine Active Straight Leg Raise  - 3 x daily - 7 x weekly - 2 sets - 10 reps - Supine Heel Slide with Strap  - 3 x daily - 7 x weekly - 10 reps - 5 seconds hold - Sit to Stand with Armchair  - 3 x daily - 7 x weekly - 2 sets - 10 reps - Seated Heel Slide  - 3 x daily - 7 x weekly - 2 sets - 10 reps  ASSESSMENT:  CLINICAL IMPRESSION: Pt able to try stairs today using single hand rail. Pt requiring CGA and cues for reciprocal gait descending 1 flight of stairs. Pt instructed to allow his  left toes to hang over the edge of the stair to help with flexion during step downs. Pt tolerating all strengthening and ROM exercises well. Pt's active arc of motion today in his left knee is 8 to 105 degrees. Passive ROM arc is 5 to 110 degrees. Continue skilled PT interventions.    OBJECTIVE IMPAIRMENTS: decreased activity tolerance, decreased balance, decreased mobility, difficulty walking, decreased ROM, decreased strength, increased edema, and pain.   ACTIVITY LIMITATIONS: lifting, bending, sitting, standing, squatting, stairs, transfers, bed mobility, and dressing  PARTICIPATION LIMITATIONS: meal prep, cleaning, laundry, driving, and community activity  PERSONAL FACTORS: 3+ comorbidities: see history above  are also affecting patient's functional outcome.   REHAB POTENTIAL: Good  CLINICAL DECISION MAKING: Stable/uncomplicated  EVALUATION COMPLEXITY: Low   GOALS: Goals reviewed with patient? Yes  SHORT TERM GOALS: (target date for Short term goals are 3 weeks 10/13/22)   1.  Patient will demonstrate independent use of home exercise program to maintain progress from in clinic treatments. Goal status: Met 09/25/2022  LONG TERM GOALS: (target dates for all long term goals are 12 weeks  12/15/22 )   1. Patient will demonstrate/report pain at worst less than or equal to 2/10 to facilitate minimal limitation in daily activity secondary to pain symptoms.  Goal status: On Going 10/16/2022  2. Patient will demonstrate independent use of home exercise program to facilitate ability to maintain/progress functional gains from skilled physical therapy services.  Goal status:  On Going 10/16/2022   3. Patient will demonstrate FOTO outcome > or = 60 % to indicate reduced disability due to condition.  Goal status:  On Going 10/11/2022   4.  Patient will demonstrate left LE MMT 5/5 throughout to faciltiate usual transfers, stairs, squatting at [facility] for daily life.   Goal status:  On Going  10/11/2022   5.  Patient will demonstrate 5 time sit to stand: </= 13 seconds with no UE support Goal status: On Going 10/11/2022   6.  Pt will be able to navigate 4-5 steps with single hand rail with step over step gait pattern.  Goal status: On Going 10/11/2022  7. Pt will be able to perform left knee active ROM from 4 to 115 degrees for improved functional mobility.   Goal status:  On Going 10/11/2022    PLAN:  PT FREQUENCY: 1-2x/week  PT DURATION: 10 weeks  PLANNED INTERVENTIONS: Therapeutic exercises, Therapeutic activity, Neuro Muscular re-education, Balance training, Gait training, Patient/Family education, Joint mobilization, Stair training, DME instructions, Dry Needling, Electrical stimulation, Traction, Cryotherapy, vasopneumatic deviceMoist heat, Taping, Ultrasound, Ionotophoresis 4mg /ml Dexamethasone, and aquatic therapy, Manual therapy.  All included unless contraindicated  PLAN FOR NEXT SESSION: continue to maximize motion, balance and gait without AD, strengthening continued.  Manual as needed for increased fleixon/extension   Narda Amber, PT, MPT 10/18/22 12:01 PM   10/18/22 12:01 PM

## 2022-10-24 ENCOUNTER — Encounter: Payer: Self-pay | Admitting: Physical Therapy

## 2022-10-24 ENCOUNTER — Ambulatory Visit: Payer: Medicare HMO | Admitting: Physical Therapy

## 2022-10-24 DIAGNOSIS — M25562 Pain in left knee: Secondary | ICD-10-CM

## 2022-10-24 DIAGNOSIS — M25662 Stiffness of left knee, not elsewhere classified: Secondary | ICD-10-CM | POA: Diagnosis not present

## 2022-10-24 DIAGNOSIS — R262 Difficulty in walking, not elsewhere classified: Secondary | ICD-10-CM

## 2022-10-24 DIAGNOSIS — R600 Localized edema: Secondary | ICD-10-CM | POA: Diagnosis not present

## 2022-10-24 NOTE — Therapy (Signed)
OUTPATIENT PHYSICAL THERAPY TREATMENT    Patient Name: Eddie Young MRN: [id number] DOB:Jan 06, 1946, 77 y.o., male Today's Date: 10/24/2022   END OF SESSION:  PT End of Session - 10/24/22 1020     Visit Number 13    Number of Visits 24    Date for PT Re-Evaluation 12/15/22    Progress Note Due on Visit 20    PT Start Time 1015    PT Stop Time 1105    PT Time Calculation (min) 50 min    Activity Tolerance Patient tolerated treatment well    Behavior During Therapy WFL for tasks assessed/performed                  Past Medical History:  Diagnosis Date   Arthritis    GERD (gastroesophageal reflux disease)    Hyperlipidemia    Hypothyroidism    Macular degeneration    Thyroid disease    hyper active   Past Surgical History:  Procedure Laterality Date   ANKLE ARTHROSCOPY WITH REPAIR SUBLUXING TENDON     KNEE CARTILAGE SURGERY     SHOULDER ARTHROSCOPY W/ ROTATOR CUFF REPAIR     TOTAL KNEE ARTHROPLASTY Left 09/01/2022   Procedure: LEFT TOTAL KNEE ARTHROPLASTY;  Surgeon: Kathryne Hitch, MD;  Location: [facility];  Service: Orthopedics;  Laterality: Left;   Patient Active Problem List   Diagnosis Date Noted   Status post total left knee replacement 09/01/2022   Unilateral primary osteoarthritis, right knee 06/21/2022   Unilateral primary osteoarthritis, left knee 09/13/2021   AC (acromioclavicular) arthritis 08/26/2019   Impingement syndrome of left shoulder 08/26/2019   Biceps tendonitis on left 08/26/2019   Pain in left shoulder 10/28/2018    PCP: Gwenlyn Found, MD   REFERRING PROVIDER: Kathryne Hitch*   REFERRING DIAG:  Diagnosis  [phone] (ICD-10-CM) - Status post total left knee replacement    THERAPY DIAG:  Acute pain of left knee  Stiffness of left knee, not elsewhere classified  Difficulty in walking, not elsewhere classified  Localized edema  Rationale for Evaluation and Treatment: Rehabilitation  ONSET DATE: left  TKA on 09/01/22  SUBJECTIVE:   SUBJECTIVE STATEMENT: 10/24/2022  Doing pretty well today; knee is coming along   PERTINENT HISTORY: Arthritis, GERD, thyroid disease, macular degeneration, hyperlipidemia, ankle arthroscopy, knee cartilage surgery, shoulder arthroscopy with rotator cuff repair, TKA on left 09/01/2022  PAIN:  NPRS scale: upon arrival 0/10 Pain location: left knee and lateral thigh Pain description: achy Aggravating factors: exercises, walking Relieving factors: resting, muscle relaxer, pain meds, ice  PRECAUTIONS: None  WEIGHT BEARING RESTRICTIONS: No WBAT  FALLS:  Has patient fallen in last 6 months? Yes, tripped over rope in yard while he was having work done.   LIVING ENVIRONMENT: Lives with: lives with their family and lives with their spouse Lives in: House/apartment Stairs: Yes: External: 3 steps; bilateral but cannot reach both Has following equipment at home: Dan Humphreys - 2 wheeled  OCCUPATION: retired  PLOF: Independent  PATIENT GOALS: walk without device  OBJECTIVE:   DIAGNOSTIC FINDINGS: IMPRESSION: Left knee arthroplasty without immediate postoperative complication.  PATIENT SURVEYS:  09/18/22  FOTO intake:    39%  10/11/22: FOTO Update 55%  COGNITION: Overall cognitive status: WFL    SENSATION: 09/18/22 WFL  EDEMA:  10/09/22: Left knee: 47 centimeters 10/16/22: Left knee 46.5 centimeters    LOWER EXTREMITY ROM:   ROM Right 09/18/22 supine Left 09/18/22 supine Left 09/22/2022 Left 09/23/2022 Left 09/25/2022 Left  09/27/2022  Left 10/05/22 Left 10/09/22  Left 10/11/22 Supine Left 10/16/22 Supine  Left 10/18/22 Supine Left 10/24/22  Knee flexion A: 124 A: 72 P: 75 With belt 82 Active 83 Active 88 Active 92 A: 100 P:107 A: 104 P: 108 supine A: 105 P: 105 A: 102 P: 105 A: 105 P: 110 A: 107  Knee extension A: 0 A: -15 P: -12 Active -15 Active -10 Active -10 Active -9 A: -3 (seated LAQ) A: -4 supine A: -5 A: -6 P: -4 A: -8 P: -5 A: -2  (seated LAQ)   (Blank rows = not tested)  LOWER EXTREMITY STRENGTH:  MMT Right 09/18/22 Left 09/18/22  Hip flexion 5 4  Knee flexion 5 3  Knee extension 5 3   (Blank rows = not tested)    FUNCTIONAL TESTS:  09/18/22: 5 time sit to stand: 21 seconds with UE support 10/09/22: 5 time sit to stand: 16.09 seconds with UE support, 14.2 sec  GAIT: Distance walked: 30 feet clinic distance, level surface Assistive device utilized: Environmental consultant - 2 wheeled Level of assistance: Modified independence Comments: increased knee flexion on the left                                                                                                                                                                       TODAY'S TREATMENT 10/24/22 TherEx Recumbent bike x 10 min; full revolutions, seat all the way back  Leg Press 100# bil 3x10, Left LE only 50# 3x10 LLE on 4" step with Rt heel taps 3x10 Lt LAQ 5# 2x10; 5 sec hold AA heel slides x 10 reps  Modalities Vaso to Lt knee x 10 min; mod pressure, 34 deg    10/18/22:  TherEx Recumbent bike x 10 min; full revolutions, seat all the way back  Leg Press 100# bil 2x15, Left LE only 50# 2 x 15 Step up on 8 inch step c bil UE support Up and down clinic stairs c single hand rail once with CGA, pt needed instruction to allow his left toes to hang over edge of step when stepping down on his Rt LE to assist with flexion and decreased pt's pain.  LAQ: x 15 holding 3-5 sec c 4# Seated AA flexion using contralateral LE for overpressure x 5 holding 20 sec Supine heel slides: x 10 Supine QS with overpressure x 5 holding 5-10 sec ROM measurements - see above for details  Manual:  Passive ROM left knee flexion, passive knee extension  Modalities Vaso to Lt knee x 10 min; mod pressure, 34 deg     TODAY'S TREATMENT 10/16/22:  TherEx Recumbent bike x 7 min; full revolutions, seat all the way back Leg Press 100# bil 2x15 with 45 sec  flexion hold between  sets; LLE only 50# x 15 reps, calf raises 31# 2 x 10  Incline board stretch 3x30 sec gastroc, soleus Step down tapping RT LE  x 20  on 2 inch step, pt unable to perform on 4 inch step  due to increased pain Mini squats at TM bar for UE support 2 x 15  Seated SLR: 2 x 15  Supine: quad stretch using strap x 5 holding 20 sec Prone: quad stretch using strap x 3 holding 60 sec ROM measurements - see above for details Edema measurements - see above for details Manual:  Passive ROM left knee flexion Modalities Vaso to Lt knee x 10 min; mod pressure, 34 deg     TODAY'S TREATMENT 10/11/22 TherEx Recumbent bike x 8 min; partial revolutions at seat 11 and progressing to full revolution Leg Press 100# bil 2x15 with 45 sec flexion hold between sets; LLE only 50# x 15 reps Incline board stretch 3x30 sec Step ups on 8 inch x 2, pt having difficulty, step lowered to 6 inch x 15 leading with left LE with intermittent UE support Seated Lt SLR 2x10 Sit to stand x 10 c UE support ROM measurements - see above for details Modalities Vaso to Lt knee x 10 min; mod pressure, 34 deg Manual:  Pt's left knee was washed using water and massage cream was placed around knee to help with dryness. It was not placed over incision site        PATIENT EDUCATION:  Education details: HEP, POC Person educated: Patient Education method: Explanation, Demonstration, Verbal cues, and Handouts Education comprehension: verbalized understanding, returned demonstration, and verbal cues required  HOME EXERCISE PROGRAM: Access Code: [email or web address] URL: [email or web address] Date: 09/18/2022 Prepared by: Narda Amber  Exercises - Long Sitting Quad Set with Towel Roll Under Heel  - 3 x daily - 7 x weekly - 2 sets - 10 reps - 5 seconds hold - Supine Active Straight Leg Raise  - 3 x daily - 7 x weekly - 2 sets - 10 reps - Supine Heel Slide with Strap  - 3 x daily - 7 x weekly - 10 reps - 5 seconds hold -  Sit to Stand with Armchair  - 3 x daily - 7 x weekly - 2 sets - 10 reps - Seated Heel Slide  - 3 x daily - 7 x weekly - 2 sets - 10 reps  ASSESSMENT:  CLINICAL IMPRESSION: Good improvement in AROM today and overall progressing well with PT.  Will continue to benefit from PT to maximize function.     OBJECTIVE IMPAIRMENTS: decreased activity tolerance, decreased balance, decreased mobility, difficulty walking, decreased ROM, decreased strength, increased edema, and pain.   ACTIVITY LIMITATIONS: lifting, bending, sitting, standing, squatting, stairs, transfers, bed mobility, and dressing  PARTICIPATION LIMITATIONS: meal prep, cleaning, laundry, driving, and community activity  PERSONAL FACTORS: 3+ comorbidities: see history above  are also affecting patient's functional outcome.   REHAB POTENTIAL: Good  CLINICAL DECISION MAKING: Stable/uncomplicated  EVALUATION COMPLEXITY: Low   GOALS: Goals reviewed with patient? Yes  SHORT TERM GOALS: (target date for Short term goals are 3 weeks 10/13/22)   1.  Patient will demonstrate independent use of home exercise program to maintain progress from in clinic treatments. Goal status: Met 09/25/2022  LONG TERM GOALS: (target dates for all long term goals are 12 weeks  12/15/22 )   1. Patient will demonstrate/report pain at worst less than or equal to 2/10 to facilitate  minimal limitation in daily activity secondary to pain symptoms.  Goal status: On Going 10/16/2022   2. Patient will demonstrate independent use of home exercise program to facilitate ability to maintain/progress functional gains from skilled physical therapy services.  Goal status:  On Going 10/16/2022   3. Patient will demonstrate FOTO outcome > or = 60 % to indicate reduced disability due to condition.  Goal status:  On Going 10/11/2022   4.  Patient will demonstrate left LE MMT 5/5 throughout to faciltiate usual transfers, stairs, squatting at [facility] [address] for daily life.   Goal  status:  On Going 10/11/2022   5.  Patient will demonstrate 5 time sit to stand: </= 13 seconds with no UE support Goal status: On Going 10/11/2022   6.  Pt will be able to navigate 4-5 steps with single hand rail with step over step gait pattern.  Goal status: On Going 10/11/2022  7. Pt will be able to perform left knee active ROM from 4 to 115 degrees for improved functional mobility.   Goal status:  On Going 10/11/2022    PLAN:  PT FREQUENCY: 1-2x/week  PT DURATION: 10 weeks  PLANNED INTERVENTIONS: Therapeutic exercises, Therapeutic activity, Neuro Muscular re-education, Balance training, Gait training, Patient/Family education, Joint mobilization, Stair training, DME instructions, Dry Needling, Electrical stimulation, Traction, Cryotherapy, vasopneumatic deviceMoist heat, Taping, Ultrasound, Ionotophoresis 4mg /ml Dexamethasone, and aquatic therapy, Manual therapy.  All included unless contraindicated  PLAN FOR NEXT SESSION: quad strengthening,  balance and gait without AD, strengthening continued.  Manual as needed for increased fleixon/extension     Clarita Crane, PT, DPT 10/24/22 10:57 AM

## 2022-10-26 ENCOUNTER — Ambulatory Visit: Payer: Medicare HMO | Admitting: Rehabilitative and Restorative Service Providers"

## 2022-10-26 ENCOUNTER — Encounter: Payer: Self-pay | Admitting: Rehabilitative and Restorative Service Providers"

## 2022-10-26 DIAGNOSIS — M25562 Pain in left knee: Secondary | ICD-10-CM

## 2022-10-26 DIAGNOSIS — R600 Localized edema: Secondary | ICD-10-CM

## 2022-10-26 DIAGNOSIS — R262 Difficulty in walking, not elsewhere classified: Secondary | ICD-10-CM | POA: Diagnosis not present

## 2022-10-26 DIAGNOSIS — M25662 Stiffness of left knee, not elsewhere classified: Secondary | ICD-10-CM | POA: Diagnosis not present

## 2022-10-26 NOTE — Therapy (Signed)
OUTPATIENT PHYSICAL THERAPY TREATMENT    Patient Name: LADISLAV GLOTFELTY MRN: [id number] DOB:[birth year], 77 y.o., male Today's Date: 10/26/2022   END OF SESSION:  PT End of Session - 10/26/22 1108     Visit Number 14    Number of Visits 24    Date for PT Re-Evaluation 12/15/22    Progress Note Due on Visit 20    PT Start Time 1104    PT Stop Time 1143    PT Time Calculation (min) 39 min    Activity Tolerance Patient tolerated treatment well    Behavior During Therapy WFL for tasks assessed/performed                   Past Medical History:  Diagnosis Date   Arthritis    GERD (gastroesophageal reflux disease)    Hyperlipidemia    Hypothyroidism    Macular degeneration    Thyroid disease    hyper active   Past Surgical History:  Procedure Laterality Date   ANKLE ARTHROSCOPY WITH REPAIR SUBLUXING TENDON     KNEE CARTILAGE SURGERY     SHOULDER ARTHROSCOPY W/ ROTATOR CUFF REPAIR     TOTAL KNEE ARTHROPLASTY Left 09/01/2022   Procedure: LEFT TOTAL KNEE ARTHROPLASTY;  Surgeon: Kathryne Hitch, MD;  Location: [facility];  Service: Orthopedics;  Laterality: Left;   Patient Active Problem List   Diagnosis Date Noted   Status post total left knee replacement 09/01/2022   Unilateral primary osteoarthritis, right knee 06/21/2022   Unilateral primary osteoarthritis, left knee 09/13/2021   AC (acromioclavicular) arthritis 08/26/2019   Impingement syndrome of left shoulder 08/26/2019   Biceps tendonitis on left 08/26/2019   Pain in left shoulder 10/28/2018    PCP: Gwenlyn Found, MD   REFERRING PROVIDER: Kathryne Hitch*   REFERRING DIAG:  Diagnosis  [phone] (ICD-10-CM) - Status post total left knee replacement    THERAPY DIAG:  Acute pain of left knee  Stiffness of left knee, not elsewhere classified  Difficulty in walking, not elsewhere classified  Localized edema  Rationale for Evaluation and Treatment: Rehabilitation  ONSET DATE:  left TKA on 09/01/22  SUBJECTIVE:   SUBJECTIVE STATEMENT: Pt indicated having click in front of knee with walking that comes and goes.  Reported not painful.    PERTINENT HISTORY: Arthritis, GERD, thyroid disease, macular degeneration, hyperlipidemia, ankle arthroscopy, knee cartilage surgery, shoulder arthroscopy with rotator cuff repair, TKA on left 09/01/2022  PAIN:  NPRS scale: upon arrival 0-1/10 Pain location: left knee and lateral thigh Pain description: achy Aggravating factors: increased WB Relieving factors: resting, muscle relaxer, pain meds, ice  PRECAUTIONS: None  WEIGHT BEARING RESTRICTIONS: No WBAT  FALLS:  Has patient fallen in last 6 months? Yes, tripped over rope in yard while he was having work done.   LIVING ENVIRONMENT: Lives with: lives with their family and lives with their spouse Lives in: House/apartment Stairs: Yes: External: 3 steps; bilateral but cannot reach both Has following equipment at home: Dan Humphreys - 2 wheeled  OCCUPATION: retired  PLOF: Independent  PATIENT GOALS: walk without device  OBJECTIVE:   DIAGNOSTIC FINDINGS: IMPRESSION: Left knee arthroplasty without immediate postoperative complication.  PATIENT SURVEYS:  09/18/22  FOTO intake:    39%  10/11/22: FOTO Update 55%  COGNITION: Overall cognitive status: WFL    SENSATION: 09/18/22 WFL  EDEMA:  10/09/22: Left knee: 47 centimeters 10/16/22: Left knee 46.5 centimeters    LOWER EXTREMITY ROM:   ROM Right 09/18/22 supine Left 09/18/22  supine Left 09/22/2022 Left 09/23/2022 Left 09/25/2022 Left  09/27/2022 Left 10/05/22 Left 10/09/22  Left 10/11/22 Supine Left 10/16/22 Supine  Left 10/18/22 Supine Left 10/24/22  Knee flexion A: 124 A: 72 P: 75 With belt 82 Active 83 Active 88 Active 92 A: 100 P:107 A: 104 P: 108 supine A: 105 P: 105 A: 102 P: 105 A: 105 P: 110 A: 107  Knee extension A: 0 A: -15 P: -12 Active -15 Active -10 Active -10 Active -9 A: -3 (seated LAQ) A:  -4 supine A: -5 A: -6 P: -4 A: -8 P: -5 A: -2 (seated LAQ)   (Blank rows = not tested)  LOWER EXTREMITY STRENGTH:  MMT Right 09/18/22 Left 09/18/22 Right 10/26/2022 Left 10/26/2022  Hip flexion 5 4    Knee flexion 5 3    Knee extension 5 3 5/5 42.7, 44.5 lbs 5/5 41.5, 40.8 lbs   (Blank rows = not tested)    FUNCTIONAL TESTS:  09/18/22: 5 time sit to stand: 21 seconds with UE support 10/09/22: 5 time sit to stand: 16.09 seconds with UE support, 14.2 sec  GAIT: 10/26/2022:  Independent ambulation, lacking TKE in stance.   09/18/22 Distance walked: 30 feet clinic distance, level surface Assistive device utilized: Walker - 2 wheeled Level of assistance: Modified independence Comments: increased knee flexion on the left                                                                                                                                                                        TODAY'S TREATMENT                                                      DATE:  10/26/2022 Therex:  Recumbent bike lvl 3 10 mins, seat all the way back.   TherActivity ( to improve WB control, stairs, transfers and walking) Step on over and down WB on Lt leg 4 inch step with light hand rail assist x 15 Leg press Double leg x 15 112 lbs, single leg 2 x 15 performed bilaterally 50 lbs  Sit to stand to sit 18 inch chair with foam pad x 10 with slow lowering focus   Neuro Re-ed 3 - 9 inch hurdle, 2 -6 inch hurdle step to gait pattern in // bars with hand assist on bar 10 ft x 5 leading with each leg (cues to flex knee) SLS with contralateral leg step over and back 6 inch hurdle x 15 bilateral with occasional to moderate HHA on bar.      TODAY'S TREATMENT  DATE:  10/24/22 TherEx Recumbent bike x 10 min; full revolutions, seat all the way back  Leg Press 100# bil 3x10, Left LE only 50# 3x10 LLE on 4" step with Rt heel taps 3x10 Lt LAQ 5# 2x10; 5 sec  hold AA heel slides x 10 reps  Modalities Vaso to Lt knee x 10 min; mod pressure, 34 deg    TODAY'S TREATMENT                                                      DATE: 10/18/22:  TherEx Recumbent bike x 10 min; full revolutions, seat all the way back  Leg Press 100# bil 2x15, Left LE only 50# 2 x 15 Step up on 8 inch step c bil UE support Up and down clinic stairs c single hand rail once with CGA, pt needed instruction to allow his left toes to hang over edge of step when stepping down on his Rt LE to assist with flexion and decreased pt's pain.  LAQ: x 15 holding 3-5 sec c 4# Seated AA flexion using contralateral LE for overpressure x 5 holding 20 sec Supine heel slides: x 10 Supine QS with overpressure x 5 holding 5-10 sec ROM measurements - see above for details  Manual:  Passive ROM left knee flexion, passive knee extension  Modalities Vaso to Lt knee x 10 min; mod pressure, 34 deg    PATIENT EDUCATION:  Education details: HEP, POC Person educated: Patient Education method: Programmer, multimedia, Facilities manager, Verbal cues, and Handouts Education comprehension: verbalized understanding, returned demonstration, and verbal cues required  HOME EXERCISE PROGRAM: Access Code: [email or web address] URL: [email or web address] Date: 09/18/2022 Prepared by: Narda Amber  Exercises - Long Sitting Quad Set with Towel Roll Under Heel  - 3 x daily - 7 x weekly - 2 sets - 10 reps - 5 seconds hold - Supine Active Straight Leg Raise  - 3 x daily - 7 x weekly - 2 sets - 10 reps - Supine Heel Slide with Strap  - 3 x daily - 7 x weekly - 10 reps - 5 seconds hold - Sit to Stand with Armchair  - 3 x daily - 7 x weekly - 2 sets - 10 reps - Seated Heel Slide  - 3 x daily - 7 x weekly - 2 sets - 10 reps  ASSESSMENT:  CLINICAL IMPRESSION: Dynamometry showed both legs similar in knee extension but below 40 % of body weight.  Flexion mobility in ambulation still impacting swing phase.  Continued  skilled PT services warranted.    OBJECTIVE IMPAIRMENTS: decreased activity tolerance, decreased balance, decreased mobility, difficulty walking, decreased ROM, decreased strength, increased edema, and pain.   ACTIVITY LIMITATIONS: lifting, bending, sitting, standing, squatting, stairs, transfers, bed mobility, and dressing  PARTICIPATION LIMITATIONS: meal prep, cleaning, laundry, driving, and community activity  PERSONAL FACTORS: 3+ comorbidities: see history above  are also affecting patient's functional outcome.   REHAB POTENTIAL: Good  CLINICAL DECISION MAKING: Stable/uncomplicated  EVALUATION COMPLEXITY: Low   GOALS: Goals reviewed with patient? Yes  SHORT TERM GOALS: (target date for Short term goals are 3 weeks 10/13/22)   1.  Patient will demonstrate independent use of home exercise program to maintain progress from in clinic treatments. Goal status: Met 09/25/2022  LONG TERM GOALS: (target dates for all long term goals are 33  weeks  12/15/22 )   1. Patient will demonstrate/report pain at worst less than or equal to 2/10 to facilitate minimal limitation in daily activity secondary to pain symptoms.  Goal status: On Going 10/16/2022   2. Patient will demonstrate independent use of home exercise program to facilitate ability to maintain/progress functional gains from skilled physical therapy services.  Goal status:  On Going 10/16/2022   3. Patient will demonstrate FOTO outcome > or = 60 % to indicate reduced disability due to condition.  Goal status:  On Going 10/11/2022   4.  Patient will demonstrate left LE MMT 5/5 throughout to faciltiate usual transfers, stairs, squatting at [facility] for daily life.   Goal status:  On Going 10/11/2022   5.  Patient will demonstrate 5 time sit to stand: </= 13 seconds with no UE support Goal status: On Going 10/11/2022   6.  Pt will be able to navigate 4-5 steps with single hand rail with step over step gait pattern.  Goal status: On  Going 10/11/2022  7. Pt will be able to perform left knee active ROM from 4 to 115 degrees for improved functional mobility.   Goal status:  On Going 10/11/2022    PLAN:  PT FREQUENCY: 1-2x/week  PT DURATION: 10 weeks  PLANNED INTERVENTIONS: Therapeutic exercises, Therapeutic activity, Neuro Muscular re-education, Balance training, Gait training, Patient/Family education, Joint mobilization, Stair training, DME instructions, Dry Needling, Electrical stimulation, Traction, Cryotherapy, vasopneumatic deviceMoist heat, Taping, Ultrasound, Ionotophoresis 4mg /ml Dexamethasone, and aquatic therapy, Manual therapy.  All included unless contraindicated  PLAN FOR NEXT SESSION: Progressive quad strengthening/ balance improvements.   Chyrel Masson, PT, DPT, OCS, ATC 10/26/22  11:48 AM

## 2022-10-31 ENCOUNTER — Encounter: Payer: Self-pay | Admitting: Physical Therapy

## 2022-10-31 ENCOUNTER — Ambulatory Visit: Payer: Medicare HMO | Admitting: Physical Therapy

## 2022-10-31 DIAGNOSIS — R600 Localized edema: Secondary | ICD-10-CM

## 2022-10-31 DIAGNOSIS — M25662 Stiffness of left knee, not elsewhere classified: Secondary | ICD-10-CM | POA: Diagnosis not present

## 2022-10-31 DIAGNOSIS — M25562 Pain in left knee: Secondary | ICD-10-CM | POA: Diagnosis not present

## 2022-10-31 DIAGNOSIS — R262 Difficulty in walking, not elsewhere classified: Secondary | ICD-10-CM

## 2022-10-31 NOTE — Therapy (Signed)
OUTPATIENT PHYSICAL THERAPY TREATMENT    Patient Name: Eddie Young MRN: [id number] DOB:15-Sep-1945, 77 y.o., male Today's Date: 10/31/2022   END OF SESSION:  PT End of Session - 10/31/22 1010     Visit Number 15    Number of Visits 24    Date for PT Re-Evaluation 12/15/22    Progress Note Due on Visit 20    PT Start Time 1009    PT Stop Time 1052    PT Time Calculation (min) 43 min    Activity Tolerance Patient tolerated treatment well    Behavior During Therapy WFL for tasks assessed/performed                    Past Medical History:  Diagnosis Date   Arthritis    GERD (gastroesophageal reflux disease)    Hyperlipidemia    Hypothyroidism    Macular degeneration    Thyroid disease    hyper active   Past Surgical History:  Procedure Laterality Date   ANKLE ARTHROSCOPY WITH REPAIR SUBLUXING TENDON     KNEE CARTILAGE SURGERY     SHOULDER ARTHROSCOPY W/ ROTATOR CUFF REPAIR     TOTAL KNEE ARTHROPLASTY Left 09/01/2022   Procedure: LEFT TOTAL KNEE ARTHROPLASTY;  Surgeon: Kathryne Hitch, MD;  Location: [facility];  Service: Orthopedics;  Laterality: Left;   Patient Active Problem List   Diagnosis Date Noted   Status post total left knee replacement 09/01/2022   Unilateral primary osteoarthritis, right knee 06/21/2022   Unilateral primary osteoarthritis, left knee 09/13/2021   AC (acromioclavicular) arthritis 08/26/2019   Impingement syndrome of left shoulder 08/26/2019   Biceps tendonitis on left 08/26/2019   Pain in left shoulder 10/28/2018    PCP: Gwenlyn Found, MD   REFERRING PROVIDER: Kathryne Hitch*   REFERRING DIAG:  Diagnosis  [phone] (ICD-10-CM) - Status post total left knee replacement    THERAPY DIAG:  Acute pain of left knee  Stiffness of left knee, not elsewhere classified  Difficulty in walking, not elsewhere classified  Localized edema  Rationale for Evaluation and Treatment: Rehabilitation  ONSET DATE:  left TKA on 09/01/22  SUBJECTIVE:   SUBJECTIVE STATEMENT: Only having a little trouble going down first few steps but improves after first 2-3 steps   PERTINENT HISTORY: Arthritis, GERD, thyroid disease, macular degeneration, hyperlipidemia, ankle arthroscopy, knee cartilage surgery, shoulder arthroscopy with rotator cuff repair, TKA on left 09/01/2022  PAIN:  NPRS scale: upon arrival 0-1/10 Pain location: left knee and lateral thigh Pain description: achy Aggravating factors: increased WB Relieving factors: resting, muscle relaxer, pain meds, ice  PRECAUTIONS: None  WEIGHT BEARING RESTRICTIONS: No WBAT  FALLS:  Has patient fallen in last 6 months? Yes, tripped over rope in yard while he was having work done.   LIVING ENVIRONMENT: Lives with: lives with their family and lives with their spouse Lives in: House/apartment Stairs: Yes: External: 3 steps; bilateral but cannot reach both Has following equipment at home: Dan Humphreys - 2 wheeled  OCCUPATION: retired  PLOF: Independent  PATIENT GOALS: walk without device  OBJECTIVE:   DIAGNOSTIC FINDINGS: IMPRESSION: Left knee arthroplasty without immediate postoperative complication.  PATIENT SURVEYS:  09/18/22  FOTO intake:    39%  10/11/22: FOTO Update 55%  COGNITION: Overall cognitive status: WFL    SENSATION: 09/18/22 WFL  EDEMA:  10/09/22: Left knee: 47 centimeters 10/16/22: Left knee 46.5 centimeters    LOWER EXTREMITY ROM:   ROM Right 09/18/22 supine Left 09/18/22 supine Left  09/22/2022 Left 09/23/2022 Left 09/25/2022 Left  09/27/2022 Left 10/05/22 Left 10/09/22  Left 10/11/22 Supine Left 10/16/22 Supine  Left 10/18/22 Supine Left 10/24/22 Left 10/31/22  Knee flexion A: 124 A: 72 P: 75 With belt 82 Active 83 Active 88 Active 92 A: 100 P:107 A: 104 P: 108 supine A: 105 P: 105 A: 102 P: 105 A: 105 P: 110 A: 107 A: 105 P: 111  Knee extension A: 0 A: -15 P: -12 Active -15 Active -10 Active -10 Active -9 A: -3  (seated LAQ) A: -4 supine A: -5 A: -6 P: -4 A: -8 P: -5 A: -2 (seated LAQ) A: -1 (seated LAQ)   (Blank rows = not tested)  LOWER EXTREMITY STRENGTH:  MMT Right 09/18/22 Left 09/18/22 Right 10/26/2022 Left 10/26/2022  Hip flexion 5 4    Knee flexion 5 3    Knee extension 5 3 5/5 42.7, 44.5 lbs 5/5 41.5, 40.8 lbs   (Blank rows = not tested)    FUNCTIONAL TESTS:  09/18/22: 5 times sit to stand: 21 seconds with UE support 10/09/22: 5 times sit to stand: 16.09 seconds with UE support, 14.2 sec  GAIT: 10/26/2022:  Independent ambulation, lacking TKE in stance.   09/18/22 Distance walked: 30 feet clinic distance, level surface Assistive device utilized: Walker - 2 wheeled Level of assistance: Modified independence Comments: increased knee flexion on the left                                                                                                                                                                        TODAY'S TREATMENT DATE: 10/31/22 TherEx Recumbent bike seat 11 x 10 min; L5 LLE on 4" step with UE support; Rt heel taps 2x15 Calf raises 2x20 Seated LAQ 3x10 on Lt; 5# Supine AA heelslides on Lt x10 reps ROM measurements - see above for details  Neuro Re-Ed Side stepping on foam x 5 laps; occasional UE support Tandem walking on foam x 5 laps; intermittent UE support     10/26/2022 Therex:  Recumbent bike lvl 3 10 mins, seat all the way back.   TherActivity ( to improve WB control, stairs, transfers and walking) Step on over and down WB on Lt leg 4 inch step with light hand rail assist x 15 Leg press Double leg x 15 112 lbs, single leg 2 x 15 performed bilaterally 50 lbs  Sit to stand to sit 18 inch chair with foam pad x 10 with slow lowering focus   Neuro Re-ed 3 - 9 inch hurdle, 2 -6 inch hurdle step to gait pattern in // bars with hand assist on bar 10 ft x 5 leading with each leg (cues to flex knee) SLS with contralateral  leg step over and back  6 inch hurdle x 15 bilateral with occasional to moderate HHA on bar.     10/24/22 TherEx Recumbent bike x 10 min; full revolutions, seat all the way back  Leg Press 100# bil 3x10, Left LE only 50# 3x10 LLE on 4" step with Rt heel taps 3x10 Lt LAQ 5# 2x10; 5 sec hold AA heel slides x 10 reps  Modalities Vaso to Lt knee x 10 min; mod pressure, 34 deg    10/18/22:  TherEx Recumbent bike x 10 min; full revolutions, seat all the way back  Leg Press 100# bil 2x15, Left LE only 50# 2 x 15 Step up on 8 inch step c bil UE support Up and down clinic stairs c single hand rail once with CGA, pt needed instruction to allow his left toes to hang over edge of step when stepping down on his Rt LE to assist with flexion and decreased pt's pain.  LAQ: x 15 holding 3-5 sec c 4# Seated AA flexion using contralateral LE for overpressure x 5 holding 20 sec Supine heel slides: x 10 Supine QS with overpressure x 5 holding 5-10 sec ROM measurements - see above for details  Manual:  Passive ROM left knee flexion, passive knee extension  Modalities Vaso to Lt knee x 10 min; mod pressure, 34 deg    PATIENT EDUCATION:  Education details: HEP, POC Person educated: Patient Education method: Programmer, multimedia, Facilities manager, Verbal cues, and Handouts Education comprehension: verbalized understanding, returned demonstration, and verbal cues required  HOME EXERCISE PROGRAM: Access Code: [email or web address] URL: [email or web address] Date: 09/18/2022 Prepared by: Narda Amber  Exercises - Long Sitting Quad Set with Towel Roll Under Heel  - 3 x daily - 7 x weekly - 2 sets - 10 reps - 5 seconds hold - Supine Active Straight Leg Raise  - 3 x daily - 7 x weekly - 2 sets - 10 reps - Supine Heel Slide with Strap  - 3 x daily - 7 x weekly - 10 reps - 5 seconds hold - Sit to Stand with Armchair  - 3 x daily - 7 x weekly - 2 sets - 10 reps - Seated Heel Slide  - 3 x daily - 7 x weekly - 2 sets - 10  reps  ASSESSMENT:  CLINICAL IMPRESSION: Pt with good improvement in functional mobility and ROM.  Anticipate we are nearing d/c.  Will continue to benefit from PT to maximize function and safe mobility.    OBJECTIVE IMPAIRMENTS: decreased activity tolerance, decreased balance, decreased mobility, difficulty walking, decreased ROM, decreased strength, increased edema, and pain.   ACTIVITY LIMITATIONS: lifting, bending, sitting, standing, squatting, stairs, transfers, bed mobility, and dressing  PARTICIPATION LIMITATIONS: meal prep, cleaning, laundry, driving, and community activity  PERSONAL FACTORS: 3+ comorbidities: see history above  are also affecting patient's functional outcome.   REHAB POTENTIAL: Good  CLINICAL DECISION MAKING: Stable/uncomplicated  EVALUATION COMPLEXITY: Low   GOALS: Goals reviewed with patient? Yes  SHORT TERM GOALS: (target date for Short term goals are 3 weeks 10/13/22)   1.  Patient will demonstrate independent use of home exercise program to maintain progress from in clinic treatments. Goal status: Met 09/25/2022  LONG TERM GOALS: (target dates for all long term goals are 12 weeks  12/15/22 )   1. Patient will demonstrate/report pain at worst less than or equal to 2/10 to facilitate minimal limitation in daily activity secondary to pain symptoms.  Goal status: On Going 10/16/2022  2. Patient will demonstrate independent use of home exercise program to facilitate ability to maintain/progress functional gains from skilled physical therapy services.  Goal status:  On Going 10/16/2022   3. Patient will demonstrate FOTO outcome > or = 60 % to indicate reduced disability due to condition.  Goal status:  On Going 10/11/2022   4.  Patient will demonstrate left LE MMT 5/5 throughout to faciltiate usual transfers, stairs, squatting at [facility] for daily life.   Goal status:  On Going 10/11/2022   5.  Patient will demonstrate 5 time sit to stand: </= 13  seconds with no UE support Goal status: On Going 10/11/2022   6.  Pt will be able to navigate 4-5 steps with single hand rail with step over step gait pattern.  Goal status: On Going 10/11/2022  7. Pt will be able to perform left knee active ROM from 4 to 115 degrees for improved functional mobility.   Goal status:  On Going 10/11/2022    PLAN:  PT FREQUENCY: 1-2x/week  PT DURATION: 10 weeks  PLANNED INTERVENTIONS: Therapeutic exercises, Therapeutic activity, Neuro Muscular re-education, Balance training, Gait training, Patient/Family education, Joint mobilization, Stair training, DME instructions, Dry Needling, Electrical stimulation, Traction, Cryotherapy, vasopneumatic deviceMoist heat, Taping, Ultrasound, Ionotophoresis 4mg /ml Dexamethasone, and aquatic therapy, Manual therapy.  All included unless contraindicated  PLAN FOR NEXT SESSION: continue with quad strengthening/ balance improvements.    NEXT MD VISIT: 11/13/22   Clarita Crane, PT, DPT 10/31/22 10:55 AM

## 2022-11-02 ENCOUNTER — Ambulatory Visit: Payer: Medicare HMO | Admitting: Physical Therapy

## 2022-11-02 ENCOUNTER — Encounter: Payer: Self-pay | Admitting: Physical Therapy

## 2022-11-02 DIAGNOSIS — R600 Localized edema: Secondary | ICD-10-CM | POA: Diagnosis not present

## 2022-11-02 DIAGNOSIS — M25562 Pain in left knee: Secondary | ICD-10-CM | POA: Diagnosis not present

## 2022-11-02 DIAGNOSIS — M25662 Stiffness of left knee, not elsewhere classified: Secondary | ICD-10-CM | POA: Diagnosis not present

## 2022-11-02 DIAGNOSIS — R262 Difficulty in walking, not elsewhere classified: Secondary | ICD-10-CM | POA: Diagnosis not present

## 2022-11-02 NOTE — Therapy (Signed)
OUTPATIENT PHYSICAL THERAPY TREATMENT    Patient Name: Eddie Young MRN: [id number] DOB:[birth year], 77 y.o., male Today's Date: 11/02/2022   END OF SESSION:  PT End of Session - 11/02/22 1015     Visit Number 16    Number of Visits 24    Date for PT Re-Evaluation 12/15/22    Progress Note Due on Visit 20    PT Start Time 1015    PT Stop Time 1056    PT Time Calculation (min) 41 min    Activity Tolerance Patient tolerated treatment well    Behavior During Therapy WFL for tasks assessed/performed                     Past Medical History:  Diagnosis Date   Arthritis    GERD (gastroesophageal reflux disease)    Hyperlipidemia    Hypothyroidism    Macular degeneration    Thyroid disease    hyper active   Past Surgical History:  Procedure Laterality Date   ANKLE ARTHROSCOPY WITH REPAIR SUBLUXING TENDON     KNEE CARTILAGE SURGERY     SHOULDER ARTHROSCOPY W/ ROTATOR CUFF REPAIR     TOTAL KNEE ARTHROPLASTY Left 09/01/2022   Procedure: LEFT TOTAL KNEE ARTHROPLASTY;  Surgeon: Kathryne Hitch, MD;  Location: [facility];  Service: Orthopedics;  Laterality: Left;   Patient Active Problem List   Diagnosis Date Noted   Status post total left knee replacement 09/01/2022   Unilateral primary osteoarthritis, right knee 06/21/2022   Unilateral primary osteoarthritis, left knee 09/13/2021   AC (acromioclavicular) arthritis 08/26/2019   Impingement syndrome of left shoulder 08/26/2019   Biceps tendonitis on left 08/26/2019   Pain in left shoulder 10/28/2018    PCP: Gwenlyn Found, MD   REFERRING PROVIDER: Kathryne Hitch*   REFERRING DIAG:  Diagnosis  [phone] (ICD-10-CM) - Status post total left knee replacement    THERAPY DIAG:  Acute pain of left knee  Stiffness of left knee, not elsewhere classified  Difficulty in walking, not elsewhere classified  Localized edema  Rationale for Evaluation and Treatment: Rehabilitation  ONSET  DATE: left TKA on 09/01/22  SUBJECTIVE:   SUBJECTIVE STATEMENT: He has tried to stretch knee.  He is having pain laying on right side to sleep.    PERTINENT HISTORY: Arthritis, GERD, thyroid disease, macular degeneration, hyperlipidemia, ankle arthroscopy, knee cartilage surgery, shoulder arthroscopy with rotator cuff repair, TKA on left 09/01/2022  PAIN:  NPRS scale: upon arrival 0-1/10, since PT highest 3/10 Pain location: left knee and lateral thigh Pain description: achy Aggravating factors: increased WB Relieving factors: resting, muscle relaxer, pain meds, ice  PRECAUTIONS: None  WEIGHT BEARING RESTRICTIONS: No WBAT  FALLS:  Has patient fallen in last 6 months? Yes, tripped over rope in yard while he was having work done.   LIVING ENVIRONMENT: Lives with: lives with their family and lives with their spouse Lives in: House/apartment Stairs: Yes: External: 3 steps; bilateral but cannot reach both Has following equipment at home: Dan Humphreys - 2 wheeled  OCCUPATION: retired  PLOF: Independent  PATIENT GOALS: walk without device  OBJECTIVE:   DIAGNOSTIC FINDINGS: IMPRESSION: Left knee arthroplasty without immediate postoperative complication.  PATIENT SURVEYS:  09/18/22  FOTO intake:    39%  10/11/22: FOTO Update 55%  COGNITION: Overall cognitive status: WFL    SENSATION: 09/18/22 WFL  EDEMA:  10/09/22: Left knee: 47 centimeters 10/16/22: Left knee 46.5 centimeters    LOWER EXTREMITY ROM:   ROM  Right 09/18/22 supine Left 09/18/22 supine Left 09/22/2022 Left 09/23/2022 Left 09/25/2022 Left  09/27/2022 Left 10/05/22 Left 10/09/22  Left 10/11/22 Supine Left 10/16/22 Supine  Left 10/18/22 Supine Left 10/24/22 Left 10/31/22  Knee flexion A: 124 A: 72 P: 75 With belt 82 Active 83 Active 88 Active 92 A: 100 P:107 A: 104 P: 108 supine A: 105 P: 105 A: 102 P: 105 A: 105 P: 110 A: 107 A: 105 P: 111  Knee extension A: 0 A: -15 P: -12 Active -15 Active -10 Active  -10 Active -9 A: -3 (seated LAQ) A: -4 supine A: -5 A: -6 P: -4 A: -8 P: -5 A: -2 (seated LAQ) A: -1 (seated LAQ)   (Blank rows = not tested)  LOWER EXTREMITY STRENGTH:  MMT Right 09/18/22 Left 09/18/22 Right 10/26/2022 Left 10/26/2022  Hip flexion 5 4    Knee flexion 5 3    Knee extension 5 3 5/5 42.7, 44.5 lbs 5/5 41.5, 40.8 lbs   (Blank rows = not tested)    FUNCTIONAL TESTS:  09/18/22: 5 times sit to stand: 21 seconds with UE support 10/09/22: 5 times sit to stand: 16.09 seconds with UE support, 14.2 sec  GAIT: 10/26/2022:  Independent ambulation, lacking TKE in stance.   09/18/22 Distance walked: 30 feet clinic distance, level surface Assistive device utilized: Environmental consultant - 2 wheeled Level of assistance: Modified independence Comments: increased knee flexion on the left                                                                                                                                                                        TODAY'S TREATMENT DATE: 11/02/2022: Therapeutic Exercise: Recumbent bike seat 11 x 10 min; L5 Knee flexion stretch with LLE foot at edge of chair against wall forward weight shift 3 reps 10 sec hold 3 sets moving stance LE closer each set Hamstring stretch LLE in chair 30 sec hold 2 reps Gastroc stretch on step heel depresssion 30 sec hold 2 reps Standing quad stretch with towel 30 sec hold 2 reps Standing BLE heel raises without UE support reaching hands overhead 2-3 sec hold 10 reps Step up, over & down 6" 10 reps BUE support Step up with RLE TKE blue theraband with LLE hip flexion / ant reach with focus on RLE knee ext. 15 reps.  PT demo & verbal cues on exercise on stairs without band. Pt verbalized & return demo understanding.  Patient negotiated stairs flight of 11 steps with 2 rails alternating pattern with minor cues.    10/31/22 TherEx Recumbent bike seat 11 x 10 min; L5 LLE on 4" step with UE support; Rt heel taps 2x15 Calf  raises 2x20 Seated LAQ 3x10 on Lt;  5# Supine AA heelslides on Lt x10 reps ROM measurements - see above for details  Neuro Re-Ed Side stepping on foam x 5 laps; occasional UE support Tandem walking on foam x 5 laps; intermittent UE support     10/26/2022 Therex:  Recumbent bike lvl 3 10 mins, seat all the way back.   TherActivity ( to improve WB control, stairs, transfers and walking) Step on over and down WB on Lt leg 4 inch step with light hand rail assist x 15 Leg press Double leg x 15 112 lbs, single leg 2 x 15 performed bilaterally 50 lbs  Sit to stand to sit 18 inch chair with foam pad x 10 with slow lowering focus   Neuro Re-ed 3 - 9 inch hurdle, 2 -6 inch hurdle step to gait pattern in // bars with hand assist on bar 10 ft x 5 leading with each leg (cues to flex knee) SLS with contralateral leg step over and back 6 inch hurdle x 15 bilateral with occasional to moderate HHA on bar.     10/24/22 TherEx Recumbent bike x 10 min; full revolutions, seat all the way back  Leg Press 100# bil 3x10, Left LE only 50# 3x10 LLE on 4" step with Rt heel taps 3x10 Lt LAQ 5# 2x10; 5 sec hold AA heel slides x 10 reps  Modalities Vaso to Lt knee x 10 min; mod pressure, 34 deg    10/18/22:  TherEx Recumbent bike x 10 min; full revolutions, seat all the way back  Leg Press 100# bil 2x15, Left LE only 50# 2 x 15 Step up on 8 inch step c bil UE support Up and down clinic stairs c single hand rail once with CGA, pt needed instruction to allow his left toes to hang over edge of step when stepping down on his Rt LE to assist with flexion and decreased pt's pain.  LAQ: x 15 holding 3-5 sec c 4# Seated AA flexion using contralateral LE for overpressure x 5 holding 20 sec Supine heel slides: x 10 Supine QS with overpressure x 5 holding 5-10 sec ROM measurements - see above for details  Manual:  Passive ROM left knee flexion, passive knee extension  Modalities Vaso to Lt knee x 10  min; mod pressure, 34 deg    PATIENT EDUCATION:  Education details: HEP, POC Person educated: Patient Education method: Programmer, multimedia, Facilities manager, Verbal cues, and Handouts Education comprehension: verbalized understanding, returned demonstration, and verbal cues required  HOME EXERCISE PROGRAM: Access Code: [email or web address] URL: [email or web address] Date: 11/02/2022 Prepared by: Vladimir Faster  Exercises - Long Sitting Quad Set with Towel Roll Under Heel  - 3 x daily - 7 x weekly - 2 sets - 10 reps - 5 seconds hold - Supine Active Straight Leg Raise  - 3 x daily - 7 x weekly - 2 sets - 10 reps - Supine Heel Slide with Strap  - 3 x daily - 7 x weekly - 10 reps - 5 seconds hold - Sit to Stand with Armchair  - 3 x daily - 7 x weekly - 2 sets - 10 reps - Seated Heel Slide  - 3 x daily - 7 x weekly - 2 sets - 10 reps - Prone Knee Extension with Ankle Weight  - 2 x daily - 7 x weekly - 1 sets - 1 reps - 3-5 minutes hold - Supine ITB Stretch with Strap  - 2 x daily - 7 x weekly - 1 sets - 5  reps - 20 seconds hold - Gastroc Stretch on Step  - 1-2 x daily - 7 x weekly - 1 sets - 3 reps - 30 seconds hold - Standing Heel Raise  - 1 x daily - 7 x weekly - 1-2 sets - 10 reps - 2-3 seconds hold - Standing Knee Flexion Stretch on Step  - 1-2 x daily - 7 x weekly - 3 sets - 3 reps - 10 seconds hold - Standing Hamstring Stretch on Chair  - 1-2 x daily - 7 x weekly - 1 sets - 3 reps - 30 seconds hold - Standing Quad Stretch with Strap  - 1-2 x daily - 7 x weekly - 1 sets - 3 reps - 30 seconds hold  ASSESSMENT:  CLINICAL IMPRESSION: PT added muscle stretches to HEP which patient appears to understand.  PT progress standing functional activities to facilitate knee extension which improved with instruction and repetition.  Patient continues to benefit from skilled PT.  OBJECTIVE IMPAIRMENTS: decreased activity tolerance, decreased balance, decreased mobility, difficulty walking, decreased ROM,  decreased strength, increased edema, and pain.   ACTIVITY LIMITATIONS: lifting, bending, sitting, standing, squatting, stairs, transfers, bed mobility, and dressing  PARTICIPATION LIMITATIONS: meal prep, cleaning, laundry, driving, and community activity  PERSONAL FACTORS: 3+ comorbidities: see history above  are also affecting patient's functional outcome.   REHAB POTENTIAL: Good  CLINICAL DECISION MAKING: Stable/uncomplicated  EVALUATION COMPLEXITY: Low   GOALS: Goals reviewed with patient? Yes  SHORT TERM GOALS: (target date for Short term goals are 3 weeks 10/13/22)   1.  Patient will demonstrate independent use of home exercise program to maintain progress from in clinic treatments. Goal status: Met 09/25/2022  LONG TERM GOALS: (target dates for all long term goals are 12 weeks  12/15/22 )   1. Patient will demonstrate/report pain at worst less than or equal to 2/10 to facilitate minimal limitation in daily activity secondary to pain symptoms.  Goal status: On Going 11/02/2022   2. Patient will demonstrate independent use of home exercise program to facilitate ability to maintain/progress functional gains from skilled physical therapy services.  Goal status:  On Going 11/02/2022   3. Patient will demonstrate FOTO outcome > or = 60 % to indicate reduced disability due to condition.  Goal status:  On Going 11/02/2022   4.  Patient will demonstrate left LE MMT 5/5 throughout to faciltiate usual transfers, stairs, squatting at [facility] for daily life.   Goal status:  On Going 11/02/2022   5.  Patient will demonstrate 5 time sit to stand: </= 13 seconds with no UE support Goal status: On Going 11/02/2022   6.  Pt will be able to navigate 4-5 steps with single hand rail with step over step gait pattern.  Goal status: On Going 11/02/2022  7. Pt will be able to perform left knee active ROM from 4 to 115 degrees for improved functional mobility.   Goal status:  On Going  11/02/2022    PLAN:  PT FREQUENCY: 1-2x/week  PT DURATION: 10 weeks  PLANNED INTERVENTIONS: Therapeutic exercises, Therapeutic activity, Neuro Muscular re-education, Balance training, Gait training, Patient/Family education, Joint mobilization, Stair training, DME instructions, Dry Needling, Electrical stimulation, Traction, Cryotherapy, vasopneumatic deviceMoist heat, Taping, Ultrasound, Ionotophoresis 4mg /ml Dexamethasone, and aquatic therapy, Manual therapy.  All included unless contraindicated  PLAN FOR NEXT SESSION: Check updated HEP, continue with quad strengthening/ balance improvements.    NEXT MD VISIT: 11/13/22    Vladimir Faster, PT, DPT 11/02/2022, 2:27 PM

## 2022-11-07 ENCOUNTER — Ambulatory Visit: Payer: Medicare HMO | Admitting: Physical Therapy

## 2022-11-07 ENCOUNTER — Encounter: Payer: Self-pay | Admitting: Physical Therapy

## 2022-11-07 DIAGNOSIS — M25562 Pain in left knee: Secondary | ICD-10-CM | POA: Diagnosis not present

## 2022-11-07 DIAGNOSIS — R262 Difficulty in walking, not elsewhere classified: Secondary | ICD-10-CM | POA: Diagnosis not present

## 2022-11-07 DIAGNOSIS — M25662 Stiffness of left knee, not elsewhere classified: Secondary | ICD-10-CM

## 2022-11-07 DIAGNOSIS — R600 Localized edema: Secondary | ICD-10-CM

## 2022-11-07 NOTE — Therapy (Signed)
OUTPATIENT PHYSICAL THERAPY TREATMENT    Patient Name: Eddie Young MRN: [id number] DOB:22-Jul-1945, 77 y.o., male Today's Date: 11/07/2022   END OF SESSION:  PT End of Session - 11/07/22 1111     Visit Number 17    Number of Visits 24    Date for PT Re-Evaluation 12/15/22    Progress Note Due on Visit 20    PT Start Time 1016    PT Stop Time 1058    PT Time Calculation (min) 42 min    Activity Tolerance Patient tolerated treatment well    Behavior During Therapy WFL for tasks assessed/performed                      Past Medical History:  Diagnosis Date   Arthritis    GERD (gastroesophageal reflux disease)    Hyperlipidemia    Hypothyroidism    Macular degeneration    Thyroid disease    hyper active   Past Surgical History:  Procedure Laterality Date   ANKLE ARTHROSCOPY WITH REPAIR SUBLUXING TENDON     KNEE CARTILAGE SURGERY     SHOULDER ARTHROSCOPY W/ ROTATOR CUFF REPAIR     TOTAL KNEE ARTHROPLASTY Left 09/01/2022   Procedure: LEFT TOTAL KNEE ARTHROPLASTY;  Surgeon: Kathryne Hitch, MD;  Location: [facility];  Service: Orthopedics;  Laterality: Left;   Patient Active Problem List   Diagnosis Date Noted   Status post total left knee replacement 09/01/2022   Unilateral primary osteoarthritis, right knee 06/21/2022   Unilateral primary osteoarthritis, left knee 09/13/2021   AC (acromioclavicular) arthritis 08/26/2019   Impingement syndrome of left shoulder 08/26/2019   Biceps tendonitis on left 08/26/2019   Pain in left shoulder 10/28/2018    PCP: Gwenlyn Found, MD   REFERRING PROVIDER: Kathryne Hitch*   REFERRING DIAG:  Diagnosis  [phone] (ICD-10-CM) - Status post total left knee replacement    THERAPY DIAG:  Acute pain of left knee  Stiffness of left knee, not elsewhere classified  Difficulty in walking, not elsewhere classified  Localized edema  Rationale for Evaluation and Treatment: Rehabilitation  ONSET  DATE: left TKA on 09/01/22  SUBJECTIVE:   SUBJECTIVE STATEMENT: He has tried to stretch knee.  He is having pain laying on right side to sleep.    PERTINENT HISTORY: Arthritis, GERD, thyroid disease, macular degeneration, hyperlipidemia, ankle arthroscopy, knee cartilage surgery, shoulder arthroscopy with rotator cuff repair, TKA on left 09/01/2022  PAIN:  NPRS scale: upon arrival 0-1/10, since PT highest 3/10 Pain location: left knee and lateral thigh Pain description: achy Aggravating factors: increased WB Relieving factors: resting, muscle relaxer, pain meds, ice  PRECAUTIONS: None  WEIGHT BEARING RESTRICTIONS: No WBAT  FALLS:  Has patient fallen in last 6 months? Yes, tripped over rope in yard while he was having work done.   LIVING ENVIRONMENT: Lives with: lives with their family and lives with their spouse Lives in: House/apartment Stairs: Yes: External: 3 steps; bilateral but cannot reach both Has following equipment at home: Dan Humphreys - 2 wheeled  OCCUPATION: retired  PLOF: Independent  PATIENT GOALS: walk without device  OBJECTIVE:   DIAGNOSTIC FINDINGS: IMPRESSION: Left knee arthroplasty without immediate postoperative complication.  PATIENT SURVEYS:  09/18/22  FOTO intake:    39%  10/11/22: FOTO Update 55%  COGNITION: Overall cognitive status: WFL    SENSATION: 09/18/22 WFL  EDEMA:  10/09/22: Left knee: 47 centimeters 10/16/22: Left knee 46.5 centimeters    LOWER EXTREMITY ROM:  ROM Right 09/18/22 supine Left 09/18/22 supine Left 09/22/2022 Left 09/23/2022 Left 09/25/2022 Left  09/27/2022 Left 10/05/22 Left 10/09/22  Left 10/11/22 Supine Left 10/16/22 Supine  Left 10/18/22 Supine Left 10/24/22 Left 10/31/22 Left 11/07/22  Knee flexion A: 124 A: 72 P: 75 With belt 82 Active 83 Active 88 Active 92 A: 100 P:107 A: 104 P: 108 supine A: 105 P: 105 A: 102 P: 105 A: 105 P: 110 A: 107 A: 105 P: 111 A:110 P: 118  Knee extension A: 0 A: -15 P: -12  Active -15 Active -10 Active -10 Active -9 A: -3 (seated LAQ) A: -4 supine A: -5 A: -6 P: -4 A: -8 P: -5 A: -2 (seated LAQ) A: -1 (seated LAQ)    (Blank rows = not tested)  LOWER EXTREMITY STRENGTH:  MMT Right 09/18/22 Left 09/18/22 Right 10/26/2022 Left 10/26/2022  Hip flexion 5 4    Knee flexion 5 3    Knee extension 5 3 5/5 42.7, 44.5 lbs 5/5 41.5, 40.8 lbs   (Blank rows = not tested)    FUNCTIONAL TESTS:  09/18/22: 5 times sit to stand: 21 seconds with UE support 10/09/22: 5 times sit to stand: 16.09 seconds with UE support, 14.2 sec  GAIT: 10/26/2022:  Independent ambulation, lacking TKE in stance.   09/18/22 Distance walked: 30 feet clinic distance, level surface Assistive device utilized: Environmental consultant - 2 wheeled Level of assistance: Modified independence Comments: increased knee flexion on the left                                                                                                                                                                       TODAY'S TREATMENT DATE: 11/07/2022: Therapeutic Exercise: Recumbent bike seat 11 x 9 min; L5 Leg Press: bil LE's 100# 2 x 15 controlled eccentrics, Left LE only 50# 2 x 15 controlled eccentrics Step ups on 6 inch x 10 c single UE support Step ups on 8 inch x 15 c single UE support Vectors c sliding disc cones placed ant/lat and post/lat x 10 c Left LE as base Supine bridges: x 10 holding 10 sec Supine hamstring stretch: x 4 holding 30 sec     TODAY'S TREATMENT DATE: 11/02/2022: Therapeutic Exercise: Recumbent bike seat 11 x 10 min; L5 Knee flexion stretch with LLE foot at edge of chair against wall forward weight shift 3 reps 10 sec hold 3 sets moving stance LE closer each set Hamstring stretch LLE in chair 30 sec hold 2 reps Gastroc stretch on step heel depresssion 30 sec hold 2 reps Standing quad stretch with towel 30 sec hold 2 reps Standing BLE heel raises without UE support reaching hands overhead 2-3  sec hold 10 reps Step up,  over & down 6" 10 reps BUE support Step up with RLE TKE blue theraband with LLE hip flexion / ant reach with focus on RLE knee ext. 15 reps.  PT demo & verbal cues on exercise on stairs without band. Pt verbalized & return demo understanding.  Patient negotiated stairs flight of 11 steps with 2 rails alternating pattern with minor cues.    10/31/22 TherEx Recumbent bike seat 11 x 10 min; L5 LLE on 4" step with UE support; Rt heel taps 2x15 Calf raises 2x20 Seated LAQ 3x10 on Lt; 5# Supine AA heelslides on Lt x10 reps ROM measurements - see above for details  Neuro Re-Ed Side stepping on foam x 5 laps; occasional UE support Tandem walking on foam x 5 laps; intermittent UE support     10/26/2022 Therex:  Recumbent bike lvl 3 10 mins, seat all the way back.   TherActivity ( to improve WB control, stairs, transfers and walking) Step on over and down WB on Lt leg 4 inch step with light hand rail assist x 15 Leg press Double leg x 15 112 lbs, single leg 2 x 15 performed bilaterally 50 lbs  Sit to stand to sit 18 inch chair with foam pad x 10 with slow lowering focus   Neuro Re-ed 3 - 9 inch hurdle, 2 -6 inch hurdle step to gait pattern in // bars with hand assist on bar 10 ft x 5 leading with each leg (cues to flex knee) SLS with contralateral leg step over and back 6 inch hurdle x 15 bilateral with occasional to moderate HHA on bar.        PATIENT EDUCATION:  Education details: HEP, POC Person educated: Patient Education method: Programmer, multimedia, Demonstration, Verbal cues, and Handouts Education comprehension: verbalized understanding, returned demonstration, and verbal cues required  HOME EXERCISE PROGRAM: Access Code: [email or web address] URL: [email or web address] Date: 11/02/2022 Prepared by: Vladimir Faster  Exercises - Long Sitting Quad Set with Towel Roll Under Heel  - 3 x daily - 7 x weekly - 2 sets - 10 reps - 5 seconds hold - Supine  Active Straight Leg Raise  - 3 x daily - 7 x weekly - 2 sets - 10 reps - Supine Heel Slide with Strap  - 3 x daily - 7 x weekly - 10 reps - 5 seconds hold - Sit to Stand with Armchair  - 3 x daily - 7 x weekly - 2 sets - 10 reps - Seated Heel Slide  - 3 x daily - 7 x weekly - 2 sets - 10 reps - Prone Knee Extension with Ankle Weight  - 2 x daily - 7 x weekly - 1 sets - 1 reps - 3-5 minutes hold - Supine ITB Stretch with Strap  - 2 x daily - 7 x weekly - 1 sets - 5 reps - 20 seconds hold - Gastroc Stretch on Step  - 1-2 x daily - 7 x weekly - 1 sets - 3 reps - 30 seconds hold - Standing Heel Raise  - 1 x daily - 7 x weekly - 1-2 sets - 10 reps - 2-3 seconds hold - Standing Knee Flexion Stretch on Step  - 1-2 x daily - 7 x weekly - 3 sets - 3 reps - 10 seconds hold - Standing Hamstring Stretch on Chair  - 1-2 x daily - 7 x weekly - 1 sets - 3 reps - 30 seconds hold - Standing Quad Stretch with Strap  - 1-2 x  daily - 7 x weekly - 1 sets - 3 reps - 30 seconds hold  ASSESSMENT:  CLINICAL IMPRESSION: Pt tolerating exercises well with pt's active left knee flexion to 110 degrees and passive left knee flexion to 118 degrees. Pt is making progress with overall functional mobility. Recommend continue skilled PT to progress toward pt's LTG's set.   OBJECTIVE IMPAIRMENTS: decreased activity tolerance, decreased balance, decreased mobility, difficulty walking, decreased ROM, decreased strength, increased edema, and pain.   ACTIVITY LIMITATIONS: lifting, bending, sitting, standing, squatting, stairs, transfers, bed mobility, and dressing  PARTICIPATION LIMITATIONS: meal prep, cleaning, laundry, driving, and community activity  PERSONAL FACTORS: 3+ comorbidities: see history above  are also affecting patient's functional outcome.   REHAB POTENTIAL: Good  CLINICAL DECISION MAKING: Stable/uncomplicated  EVALUATION COMPLEXITY: Low   GOALS: Goals reviewed with patient? Yes  SHORT TERM GOALS: (target  date for Short term goals are 3 weeks 10/13/22)   1.  Patient will demonstrate independent use of home exercise program to maintain progress from in clinic treatments. Goal status: Met 09/25/2022  LONG TERM GOALS: (target dates for all long term goals are 12 weeks  12/15/22 )   1. Patient will demonstrate/report pain at worst less than or equal to 2/10 to facilitate minimal limitation in daily activity secondary to pain symptoms.  Goal status: On Going 11/02/2022   2. Patient will demonstrate independent use of home exercise program to facilitate ability to maintain/progress functional gains from skilled physical therapy services.  Goal status:  On Going 11/02/2022   3. Patient will demonstrate FOTO outcome > or = 60 % to indicate reduced disability due to condition.  Goal status:  On Going 11/02/2022   4.  Patient will demonstrate left LE MMT 5/5 throughout to faciltiate usual transfers, stairs, squatting at [facility] for daily life.   Goal status:  On Going 11/02/2022   5.  Patient will demonstrate 5 time sit to stand: </= 13 seconds with no UE support Goal status: On Going 11/02/2022   6.  Pt will be able to navigate 4-5 steps with single hand rail with step over step gait pattern.  Goal status: On Going 11/02/2022  7. Pt will be able to perform left knee active ROM from 4 to 115 degrees for improved functional mobility.   Goal status:  On Going 11/02/2022    PLAN:  PT FREQUENCY: 1-2x/week  PT DURATION: 10 weeks  PLANNED INTERVENTIONS: Therapeutic exercises, Therapeutic activity, Neuro Muscular re-education, Balance training, Gait training, Patient/Family education, Joint mobilization, Stair training, DME instructions, Dry Needling, Electrical stimulation, Traction, Cryotherapy, vasopneumatic deviceMoist heat, Taping, Ultrasound, Ionotophoresis 4mg /ml Dexamethasone, and aquatic therapy, Manual therapy.  All included unless contraindicated  PLAN FOR NEXT SESSION: Continue with  quad strengthening/ balance improvements.    NEXT MD VISIT: 11/13/22    Sharmon Leyden, PT, MPT 11/07/2022, 11:12 AM

## 2022-11-09 ENCOUNTER — Ambulatory Visit: Payer: Medicare HMO | Admitting: Rehabilitative and Restorative Service Providers"

## 2022-11-09 ENCOUNTER — Encounter: Payer: Self-pay | Admitting: Rehabilitative and Restorative Service Providers"

## 2022-11-09 DIAGNOSIS — R600 Localized edema: Secondary | ICD-10-CM

## 2022-11-09 DIAGNOSIS — R262 Difficulty in walking, not elsewhere classified: Secondary | ICD-10-CM

## 2022-11-09 DIAGNOSIS — M25562 Pain in left knee: Secondary | ICD-10-CM

## 2022-11-09 DIAGNOSIS — M25662 Stiffness of left knee, not elsewhere classified: Secondary | ICD-10-CM | POA: Diagnosis not present

## 2022-11-09 NOTE — Therapy (Signed)
OUTPATIENT PHYSICAL THERAPY TREATMENT    Patient Name: Eddie Young MRN: [id number] DOB:March 07, 1945, 77 y.o., male Today's Date: 11/09/2022   END OF SESSION:  PT End of Session - 11/09/22 1144     Visit Number 18    Number of Visits 24    Date for PT Re-Evaluation 12/15/22    Progress Note Due on Visit 20    PT Start Time 1141    PT Stop Time 1220    PT Time Calculation (min) 39 min    Activity Tolerance Patient tolerated treatment well    Behavior During Therapy WFL for tasks assessed/performed                       Past Medical History:  Diagnosis Date   Arthritis    GERD (gastroesophageal reflux disease)    Hyperlipidemia    Hypothyroidism    Macular degeneration    Thyroid disease    hyper active   Past Surgical History:  Procedure Laterality Date   ANKLE ARTHROSCOPY WITH REPAIR SUBLUXING TENDON     KNEE CARTILAGE SURGERY     SHOULDER ARTHROSCOPY W/ ROTATOR CUFF REPAIR     TOTAL KNEE ARTHROPLASTY Left 09/01/2022   Procedure: LEFT TOTAL KNEE ARTHROPLASTY;  Surgeon: Kathryne Hitch, MD;  Location: [facility];  Service: Orthopedics;  Laterality: Left;   Patient Active Problem List   Diagnosis Date Noted   Status post total left knee replacement 09/01/2022   Unilateral primary osteoarthritis, right knee 06/21/2022   Unilateral primary osteoarthritis, left knee 09/13/2021   AC (acromioclavicular) arthritis 08/26/2019   Impingement syndrome of left shoulder 08/26/2019   Biceps tendonitis on left 08/26/2019   Pain in left shoulder 10/28/2018    PCP: Gwenlyn Found, MD   REFERRING PROVIDER: Kathryne Hitch*   REFERRING DIAG:  Diagnosis  [phone] (ICD-10-CM) - Status post total left knee replacement    THERAPY DIAG:  Acute pain of left knee  Stiffness of left knee, not elsewhere classified  Difficulty in walking, not elsewhere classified  Localized edema  Rationale for Evaluation and Treatment: Rehabilitation  ONSET  DATE: left TKA on 09/01/22  SUBJECTIVE:   SUBJECTIVE STATEMENT: Pt indicated going well overall but step down is still troublesome.  Reported difficulty.   PERTINENT HISTORY: Arthritis, GERD, thyroid disease, macular degeneration, hyperlipidemia, ankle arthroscopy, knee cartilage surgery, shoulder arthroscopy with rotator cuff repair, TKA on left 09/01/2022  PAIN:  NPRS scale: no pain to report upon arrival today Pain location: left knee and lateral thigh Pain description: achy Aggravating factors: increased WB Relieving factors: resting, muscle relaxer, pain meds, ice  PRECAUTIONS: None  WEIGHT BEARING RESTRICTIONS: No WBAT  FALLS:  Has patient fallen in last 6 months? Yes, tripped over rope in yard while he was having work done.   LIVING ENVIRONMENT: Lives with: lives with their family and lives with their spouse Lives in: House/apartment Stairs: Yes: External: 3 steps; bilateral but cannot reach both Has following equipment at home: Dan Humphreys - 2 wheeled  OCCUPATION: retired  PLOF: Independent  PATIENT GOALS: walk without device  OBJECTIVE:   DIAGNOSTIC FINDINGS: IMPRESSION: Left knee arthroplasty without immediate postoperative complication.  PATIENT SURVEYS:  11/09/2022:  FOTO update:  59  10/11/22: FOTO Update 55%  09/18/22  FOTO intake:    39%    COGNITION: Overall cognitive status: WFL    SENSATION: 09/18/22 WFL  EDEMA:  10/09/22: Left knee: 47 centimeters 10/16/22: Left knee 46.5 centimeters  LOWER EXTREMITY ROM:   ROM Right 09/18/22 supine Left 09/18/22 supine Left 09/22/2022 Left 09/23/2022 Left 09/25/2022 Left  09/27/2022 Left 10/05/22 Left 10/09/22  Left 10/11/22 Supine Left 10/16/22 Supine  Left 10/18/22 Supine Left 10/24/22 Left 10/31/22 Left 11/07/22  Knee flexion A: 124 A: 72 P: 75 With belt 82 Active 83 Active 88 Active 92 A: 100 P:107 A: 104 P: 108 supine A: 105 P: 105 A: 102 P: 105 A: 105 P: 110 A: 107 A: 105 P: 111 A:110 P: 118   Knee extension A: 0 A: -15 P: -12 Active -15 Active -10 Active -10 Active -9 A: -3 (seated LAQ) A: -4 supine A: -5 A: -6 P: -4 A: -8 P: -5 A: -2 (seated LAQ) A: -1 (seated LAQ)    (Blank rows = not tested)  LOWER EXTREMITY STRENGTH:  MMT Right 09/18/22 Left 09/18/22 Right 10/26/2022 Left 10/26/2022  Hip flexion 5 4    Knee flexion 5 3    Knee extension 5 3 5/5 42.7, 44.5 lbs 5/5 41.5, 40.8 lbs   (Blank rows = not tested)  FUNCTIONAL TESTS:  10/09/22: 5 times sit to stand: 16.09 seconds with UE support, 14.2 sec  09/18/22: 5 times sit to stand: 21 seconds with UE support   GAIT: 10/26/2022:  Independent ambulation, lacking TKE in stance.   09/18/22 Distance walked: 30 feet clinic distance, level surface Assistive device utilized: Environmental consultant - 2 wheeled Level of assistance: Modified independence Comments: increased knee flexion on the left                                                                                                                                                                        TODAY'S TREATMENT                                             DATE:11/09/2022: Therapeutic Exercise: Recumbent bike lvl 10 mins Verbal review of continued use of HEP for strengthening including bottom step activity.   TherActivity (to improve functional strength/loading for stairs/ambulation) Leg press Lt leg 2 x 15 50 lbs full range on machine Step on over and down WB on Lt leg with light hand assist for balance on bar x 15 on 6 inch step Lateral step down control 4 inch step WB on Lt leg x 15  Flight of stairs 6 steps to mimic home up/down reciprocal gait for replication of staircase with rail going up on Rt, down on Lt.  X 3 Full flight of stairs in clinic going down c reciprocal pattern/bilateral hand rails.    TODAY'S TREATMENT  DATE:11/07/2022: Therapeutic Exercise: Recumbent bike seat 11 x 9 min; L5 Leg Press: bil LE's 100# 2 x  15 controlled eccentrics, Left LE only 50# 2 x 15 controlled eccentrics Step ups on 6 inch x 10 c single UE support Step ups on 8 inch x 15 c single UE support Vectors c sliding disc cones placed ant/lat and post/lat x 10 c Left LE as base Supine bridges: x 10 holding 10 sec Supine hamstring stretch: x 4 holding 30 sec     TODAY'S TREATMENT                                                DATE: 11/02/2022: Therapeutic Exercise: Recumbent bike seat 11 x 10 min; L5 Knee flexion stretch with LLE foot at edge of chair against wall forward weight shift 3 reps 10 sec hold 3 sets moving stance LE closer each set Hamstring stretch LLE in chair 30 sec hold 2 reps Gastroc stretch on step heel depresssion 30 sec hold 2 reps Standing quad stretch with towel 30 sec hold 2 reps Standing BLE heel raises without UE support reaching hands overhead 2-3 sec hold 10 reps Step up, over & down 6" 10 reps BUE support Step up with RLE TKE blue theraband with LLE hip flexion / ant reach with focus on RLE knee ext. 15 reps.  PT demo & verbal cues on exercise on stairs without band. Pt verbalized & return demo understanding.  Patient negotiated stairs flight of 11 steps with 2 rails alternating pattern with minor cues.    TODAY'S TREATMENT                                                DATE:10/31/22 TherEx Recumbent bike seat 11 x 10 min; L5 LLE on 4" step with UE support; Rt heel taps 2x15 Calf raises 2x20 Seated LAQ 3x10 on Lt; 5# Supine AA heelslides on Lt x10 reps ROM measurements - see above for details  Neuro Re-Ed Side stepping on foam x 5 laps; occasional UE support Tandem walking on foam x 5 laps; intermittent UE support    PATIENT EDUCATION:  Education details: HEP, POC Person educated: Patient Education method: Programmer, multimedia, Demonstration, Verbal cues, and Handouts Education comprehension: verbalized understanding, returned demonstration, and verbal cues required  HOME EXERCISE  PROGRAM: Access Code: [email or web address] URL: [email or web address] Date: 11/02/2022 Prepared by: Vladimir Faster  Exercises - Long Sitting Quad Set with Towel Roll Under Heel  - 3 x daily - 7 x weekly - 2 sets - 10 reps - 5 seconds hold - Supine Active Straight Leg Raise  - 3 x daily - 7 x weekly - 2 sets - 10 reps - Supine Heel Slide with Strap  - 3 x daily - 7 x weekly - 10 reps - 5 seconds hold - Sit to Stand with Armchair  - 3 x daily - 7 x weekly - 2 sets - 10 reps - Seated Heel Slide  - 3 x daily - 7 x weekly - 2 sets - 10 reps - Prone Knee Extension with Ankle Weight  - 2 x daily - 7 x weekly - 1 sets - 1 reps - 3-5 minutes hold - Supine ITB Stretch with  Strap  - 2 x daily - 7 x weekly - 1 sets - 5 reps - 20 seconds hold - Gastroc Stretch on Step  - 1-2 x daily - 7 x weekly - 1 sets - 3 reps - 30 seconds hold - Standing Heel Raise  - 1 x daily - 7 x weekly - 1-2 sets - 10 reps - 2-3 seconds hold - Standing Knee Flexion Stretch on Step  - 1-2 x daily - 7 x weekly - 3 sets - 3 reps - 10 seconds hold - Standing Hamstring Stretch on Chair  - 1-2 x daily - 7 x weekly - 1 sets - 3 reps - 30 seconds hold - Standing Quad Stretch with Strap  - 1-2 x daily - 7 x weekly - 1 sets - 3 reps - 30 seconds hold  ASSESSMENT:  CLINICAL IMPRESSION: Pt to benefit from continued functional gains in strength in eccentric lowering control to improve stair navigation.  Otherwise making good progress and showing closer to HEP transitioning as able.   OBJECTIVE IMPAIRMENTS: decreased activity tolerance, decreased balance, decreased mobility, difficulty walking, decreased ROM, decreased strength, increased edema, and pain.   ACTIVITY LIMITATIONS: lifting, bending, sitting, standing, squatting, stairs, transfers, bed mobility, and dressing  PARTICIPATION LIMITATIONS: meal prep, cleaning, laundry, driving, and community activity  PERSONAL FACTORS: 3+ comorbidities: see history above  are also affecting  patient's functional outcome.   REHAB POTENTIAL: Good  CLINICAL DECISION MAKING: Stable/uncomplicated  EVALUATION COMPLEXITY: Low   GOALS: Goals reviewed with patient? Yes  SHORT TERM GOALS: (target date for Short term goals are 3 weeks 10/13/22)   1.  Patient will demonstrate independent use of home exercise program to maintain progress from in clinic treatments. Goal status: Met 09/25/2022  LONG TERM GOALS: (target dates for all long term goals are 12 weeks  12/15/22 )   1. Patient will demonstrate/report pain at worst less than or equal to 2/10 to facilitate minimal limitation in daily activity secondary to pain symptoms.  Goal status: On Going 11/02/2022   2. Patient will demonstrate independent use of home exercise program to facilitate ability to maintain/progress functional gains from skilled physical therapy services.  Goal status:  On Going 11/02/2022   3. Patient will demonstrate FOTO outcome > or = 60 % to indicate reduced disability due to condition.  Goal status:  On Going 11/02/2022   4.  Patient will demonstrate left LE MMT 5/5 throughout to faciltiate usual transfers, stairs, squatting at [facility] for daily life.   Goal status:  On Going 11/02/2022   5.  Patient will demonstrate 5 time sit to stand: </= 13 seconds with no UE support Goal status: On Going 11/02/2022   6.  Pt will be able to navigate 4-5 steps with single hand rail with step over step gait pattern.  Goal status: On Going 11/02/2022  7. Pt will be able to perform left knee active ROM from 4 to 115 degrees for improved functional mobility.   Goal status:  On Going 11/02/2022    PLAN:  PT FREQUENCY: 1-2x/week  PT DURATION: 10 weeks  PLANNED INTERVENTIONS: Therapeutic exercises, Therapeutic activity, Neuro Muscular re-education, Balance training, Gait training, Patient/Family education, Joint mobilization, Stair training, DME instructions, Dry Needling, Electrical stimulation, Traction,  Cryotherapy, vasopneumatic deviceMoist heat, Taping, Ultrasound, Ionotophoresis 4mg /ml Dexamethasone, and aquatic therapy, Manual therapy.  All included unless contraindicated  PLAN FOR NEXT SESSION: Discuss possible discharge/trial HEP planning soon?   NEXT MD VISIT: 11/13/22   Casimiro Needle  Delford Field, PT, DPT, OCS, ATC 11/09/22  12:21 PM

## 2022-11-13 ENCOUNTER — Ambulatory Visit: Payer: Medicare HMO | Admitting: Physical Therapy

## 2022-11-13 ENCOUNTER — Encounter: Payer: Self-pay | Admitting: Physical Therapy

## 2022-11-13 ENCOUNTER — Ambulatory Visit: Payer: Medicare HMO | Admitting: Orthopaedic Surgery

## 2022-11-13 ENCOUNTER — Ambulatory Visit (INDEPENDENT_AMBULATORY_CARE_PROVIDER_SITE_OTHER): Payer: Self-pay

## 2022-11-13 DIAGNOSIS — M25662 Stiffness of left knee, not elsewhere classified: Secondary | ICD-10-CM | POA: Diagnosis not present

## 2022-11-13 DIAGNOSIS — Z96652 Presence of left artificial knee joint: Secondary | ICD-10-CM

## 2022-11-13 DIAGNOSIS — R600 Localized edema: Secondary | ICD-10-CM

## 2022-11-13 DIAGNOSIS — R262 Difficulty in walking, not elsewhere classified: Secondary | ICD-10-CM

## 2022-11-13 DIAGNOSIS — M25562 Pain in left knee: Secondary | ICD-10-CM

## 2022-11-13 NOTE — Progress Notes (Signed)
The patient is now 10 weeks status post a left total knee replacement.  He just graduated from physical therapy today and said he is doing well.  They were able to flex him to 118 degrees.  He is very pleased overall.  He does have known arthritis in his right knee but it really does not bother him much at all.  He is walking without an assistive device.  He is active at 19.  There is swelling of his left knee to be expected and I told him this can take many more months before the swelling subsides.  The knee feels like this is stable with good range of motion.  X-rays of the left knee today show well-seated total knee arthroplasty with no complicating features.  From my standpoint we will see him back in 6 months or sooner unless there is any issues.  At that visit in 6 months I would like a final AP and lateral of his left operative knee.

## 2022-11-13 NOTE — Therapy (Signed)
OUTPATIENT PHYSICAL THERAPY TREATMENT DISCHARGE SUMMARY    Patient Name: Eddie Young MRN: [id number] DOB:1945/09/21, 77 y.o., male Today's Date: 11/13/2022   END OF SESSION:  PT End of Session - 11/13/22 1300     Visit Number 19    Number of Visits 24    Date for PT Re-Evaluation 12/15/22    Progress Note Due on Visit 20    PT Start Time 1300    PT Stop Time 1327    PT Time Calculation (min) 27 min    Activity Tolerance Patient tolerated treatment well    Behavior During Therapy WFL for tasks assessed/performed                        Past Medical History:  Diagnosis Date   Arthritis    GERD (gastroesophageal reflux disease)    Hyperlipidemia    Hypothyroidism    Macular degeneration    Thyroid disease    hyper active   Past Surgical History:  Procedure Laterality Date   ANKLE ARTHROSCOPY WITH REPAIR SUBLUXING TENDON     KNEE CARTILAGE SURGERY     SHOULDER ARTHROSCOPY W/ ROTATOR CUFF REPAIR     TOTAL KNEE ARTHROPLASTY Left 09/01/2022   Procedure: LEFT TOTAL KNEE ARTHROPLASTY;  Surgeon: Kathryne Hitch, MD;  Location: [facility];  Service: Orthopedics;  Laterality: Left;   Patient Active Problem List   Diagnosis Date Noted   Status post total left knee replacement 09/01/2022   Unilateral primary osteoarthritis, right knee 06/21/2022   Unilateral primary osteoarthritis, left knee 09/13/2021   AC (acromioclavicular) arthritis 08/26/2019   Impingement syndrome of left shoulder 08/26/2019   Biceps tendonitis on left 08/26/2019   Pain in left shoulder 10/28/2018    PCP: Gwenlyn Found, MD   REFERRING PROVIDER: Kathryne Hitch*   REFERRING DIAG:  Diagnosis  [id number] (ICD-10-CM) - Status post total left knee replacement    THERAPY DIAG:  Acute pain of left knee  Stiffness of left knee, not elsewhere classified  Difficulty in walking, not elsewhere classified  Localized edema  Rationale for Evaluation and Treatment:  Rehabilitation  ONSET DATE: left TKA on 09/01/22  SUBJECTIVE:   SUBJECTIVE STATEMENT: Feels ready to d/c today; no complaints; pain 1/10 at most   PERTINENT HISTORY: Arthritis, GERD, thyroid disease, macular degeneration, hyperlipidemia, ankle arthroscopy, knee cartilage surgery, shoulder arthroscopy with rotator cuff repair, TKA on left 09/01/2022  PAIN:  NPRS scale: no pain to report upon arrival today Pain location: left knee and lateral thigh Pain description: achy Aggravating factors: increased WB Relieving factors: resting, muscle relaxer, pain meds, ice  PRECAUTIONS: None  WEIGHT BEARING RESTRICTIONS: No WBAT  FALLS:  Has patient fallen in last 6 months? Yes, tripped over rope in yard while he was having work done.   LIVING ENVIRONMENT: Lives with: lives with their family and lives with their spouse Lives in: House/apartment Stairs: Yes: External: 3 steps; bilateral but cannot reach both Has following equipment at home: Dan Humphreys - 2 wheeled  OCCUPATION: retired  PLOF: Independent  PATIENT GOALS: walk without device  OBJECTIVE:   DIAGNOSTIC FINDINGS: IMPRESSION: Left knee arthroplasty without immediate postoperative complication.  PATIENT SURVEYS:  11/09/2022:  FOTO update:  59  10/11/22: FOTO Update 55%  09/18/22  FOTO intake:    39%    COGNITION: Overall cognitive status: WFL    SENSATION: 09/18/22 WFL  EDEMA:  10/09/22: Left knee: 47 centimeters 10/16/22: Left knee 46.5 centimeters  LOWER EXTREMITY ROM:   ROM Right 09/18/22 supine Left 09/18/22 supine Left 09/22/2022 Left 09/23/2022 Left 09/25/2022 Left  09/27/2022 Left 10/05/22 Left 10/09/22  Left 10/11/22 Supine Left 10/16/22 Supine  Left 10/18/22 Supine Left 10/24/22 Left 10/31/22 Left 11/07/22 Left 11/13/22  Knee flexion A: 124 A: 72 P: 75 With belt 82 Active 83 Active 88 Active 92 A: 100 P:107 A: 104 P: 108 supine A: 105 P: 105 A: 102 P: 105 A: 105 P: 110 A: 107 A: 105 P: 111  A:110 P: 118 A: 115  Knee extension A: 0 A: -15 P: -12 Active -15 Active -10 Active -10 Active -9 A: -3 (seated LAQ) A: -4 supine A: -5 A: -6 P: -4 A: -8 P: -5 A: -2 (seated LAQ) A: -1 (seated LAQ)  A:: 0   (Blank rows = not tested)  LOWER EXTREMITY STRENGTH:  MMT Right 09/18/22 Left 09/18/22 Right 10/26/2022 Left 10/26/2022 Left 11/13/22  Hip flexion 5 4   4/5  Knee flexion 5 3   5/5  Knee extension 5 3 5/5 42.7, 44.5 lbs 5/5 41.5, 40.8 lbs    (Blank rows = not tested)  FUNCTIONAL TESTS:  11/13/22: 5xSTS: 12.91 sec without UE support  10/09/22: 5 times sit to stand: 16.09 seconds with UE support, 14.2 sec  09/18/22: 5 times sit to stand: 21 seconds with UE support   GAIT: 10/26/2022:  Independent ambulation, lacking TKE in stance.   09/18/22 Distance walked: 30 feet clinic distance, level surface Assistive device utilized: Environmental consultant - 2 wheeled Level of assistance: Modified independence Comments: increased knee flexion on the left                                                                                                                                                                        TODAY'S TREATMENT DATE: 11/13/22 TherEx Recumbent bike L6 x 10 min     11/09/2022: Therapeutic Exercise: Recumbent bike lvl 10 mins Verbal review of continued use of HEP for strengthening including bottom step activity.   TherActivity (to improve functional strength/loading for stairs/ambulation) Leg press Lt leg 2 x 15 50 lbs full range on machine Step on over and down WB on Lt leg with light hand assist for balance on bar x 15 on 6 inch step Lateral step down control 4 inch step WB on Lt leg x 15  Flight of stairs 6 steps to mimic home up/down reciprocal gait for replication of staircase with rail going up on Rt, down on Lt.  X 3 Full flight of stairs in clinic going down c reciprocal pattern/bilateral hand rails.    11/07/2022: Therapeutic Exercise: Recumbent bike seat  11 x 9 min; L5 Leg Press: bil LE's 100# 2  x 15 controlled eccentrics, Left LE only 50# 2 x 15 controlled eccentrics Step ups on 6 inch x 10 c single UE support Step ups on 8 inch x 15 c single UE support Vectors c sliding disc cones placed ant/lat and post/lat x 10 c Left LE as base Supine bridges: x 10 holding 10 sec Supine hamstring stretch: x 4 holding 30 sec     11/02/2022: Therapeutic Exercise: Recumbent bike seat 11 x 10 min; L5 Knee flexion stretch with LLE foot at edge of chair against wall forward weight shift 3 reps 10 sec hold 3 sets moving stance LE closer each set Hamstring stretch LLE in chair 30 sec hold 2 reps Gastroc stretch on step heel depresssion 30 sec hold 2 reps Standing quad stretch with towel 30 sec hold 2 reps Standing BLE heel raises without UE support reaching hands overhead 2-3 sec hold 10 reps Step up, over & down 6" 10 reps BUE support Step up with RLE TKE blue theraband with LLE hip flexion / ant reach with focus on RLE knee ext. 15 reps.  PT demo & verbal cues on exercise on stairs without band. Pt verbalized & return demo understanding.  Patient negotiated stairs flight of 11 steps with 2 rails alternating pattern with minor cues.   10/31/22 TherEx Recumbent bike seat 11 x 10 min; L5 LLE on 4" step with UE support; Rt heel taps 2x15 Calf raises 2x20 Seated LAQ 3x10 on Lt; 5# Supine AA heelslides on Lt x10 reps ROM measurements - see above for details  Neuro Re-Ed Side stepping on foam x 5 laps; occasional UE support Tandem walking on foam x 5 laps; intermittent UE support    PATIENT EDUCATION:  Education details: HEP, POC Person educated: Patient Education method: Programmer, multimedia, Demonstration, Verbal cues, and Handouts Education comprehension: verbalized understanding, returned demonstration, and verbal cues required  HOME EXERCISE PROGRAM: Access Code: [email or web address] URL: [email or web address] Date: 11/02/2022 Prepared by:  Vladimir Faster  Exercises - Long Sitting Quad Set with Towel Roll Under Heel  - 3 x daily - 7 x weekly - 2 sets - 10 reps - 5 seconds hold - Supine Active Straight Leg Raise  - 3 x daily - 7 x weekly - 2 sets - 10 reps - Supine Heel Slide with Strap  - 3 x daily - 7 x weekly - 10 reps - 5 seconds hold - Sit to Stand with Armchair  - 3 x daily - 7 x weekly - 2 sets - 10 reps - Seated Heel Slide  - 3 x daily - 7 x weekly - 2 sets - 10 reps - Prone Knee Extension with Ankle Weight  - 2 x daily - 7 x weekly - 1 sets - 1 reps - 3-5 minutes hold - Supine ITB Stretch with Strap  - 2 x daily - 7 x weekly - 1 sets - 5 reps - 20 seconds hold - Gastroc Stretch on Step  - 1-2 x daily - 7 x weekly - 1 sets - 3 reps - 30 seconds hold - Standing Heel Raise  - 1 x daily - 7 x weekly - 1-2 sets - 10 reps - 2-3 seconds hold - Standing Knee Flexion Stretch on Step  - 1-2 x daily - 7 x weekly - 3 sets - 3 reps - 10 seconds hold - Standing Hamstring Stretch on Chair  - 1-2 x daily - 7 x weekly - 1 sets - 3 reps - 30 seconds hold -  Standing Quad Stretch with Strap  - 1-2 x daily - 7 x weekly - 1 sets - 3 reps - 30 seconds hold  ASSESSMENT:  CLINICAL IMPRESSION: Pt has met all goals at this time and is ready to d/c from PT today.  Has HEP to continue with strengthening.   OBJECTIVE IMPAIRMENTS: decreased activity tolerance, decreased balance, decreased mobility, difficulty walking, decreased ROM, decreased strength, increased edema, and pain.   ACTIVITY LIMITATIONS: lifting, bending, sitting, standing, squatting, stairs, transfers, bed mobility, and dressing  PARTICIPATION LIMITATIONS: meal prep, cleaning, laundry, driving, and community activity  PERSONAL FACTORS: 3+ comorbidities: see history above  are also affecting patient's functional outcome.   REHAB POTENTIAL: Good  CLINICAL DECISION MAKING: Stable/uncomplicated  EVALUATION COMPLEXITY: Low   GOALS: Goals reviewed with patient? Yes  SHORT TERM  GOALS: (target date for Short term goals are 3 weeks 10/13/22)   1.  Patient will demonstrate independent use of home exercise program to maintain progress from in clinic treatments. Goal status: Met 09/25/2022  LONG TERM GOALS: (target dates for all long term goals are 12 weeks  12/15/22 )   1. Patient will demonstrate/report pain at worst less than or equal to 2/10 to facilitate minimal limitation in daily activity secondary to pain symptoms. Goal status: MET 11/13/22   2. Patient will demonstrate independent use of home exercise program to facilitate ability to maintain/progress functional gains from skilled physical therapy services. Goal status:   MET 11/13/22   3. Patient will demonstrate FOTO outcome > or = 60 % to indicate reduced disability due to condition. Goal status:  MET 11/13/22   4.  Patient will demonstrate left LE MMT 5/5 throughout to faciltiate usual transfers, stairs, squatting at [facility] for daily life.  Goal status: MET (knee flex/ext) 11/13/22   5.  Patient will demonstrate 5 time sit to stand: </= 13 seconds with no UE support Goal status: MET 11/13/22   6.  Pt will be able to navigate 4-5 steps with single hand rail with step over step gait pattern.  Goal status: MET 11/13/22  7. Pt will be able to perform left knee active ROM from 4 to 115 degrees for improved functional mobility.   Goal status:  MET 11/13/22    PLAN:  PT FREQUENCY: 1-2x/week  PT DURATION: 10 weeks  PLANNED INTERVENTIONS: Therapeutic exercises, Therapeutic activity, Neuro Muscular re-education, Balance training, Gait training, Patient/Family education, Joint mobilization, Stair training, DME instructions, Dry Needling, Electrical stimulation, Traction, Cryotherapy, vasopneumatic deviceMoist heat, Taping, Ultrasound, Ionotophoresis 4mg /ml Dexamethasone, and aquatic therapy, Manual therapy.  All included unless contraindicated  PLAN FOR NEXT SESSION: d/c PT today   NEXT MD VISIT:  11/13/22   Clarita Crane, PT, DPT 11/13/22 1:29 PM    PHYSICAL THERAPY DISCHARGE SUMMARY  Visits from Start of Care: 19  Current functional level related to goals / functional outcomes: See above   Remaining deficits: See above   Education / Equipment: HEP   Patient agrees to discharge. Patient goals were met. Patient is being discharged due to meeting the stated rehab goals.   Clarita Crane, PT, DPT 11/13/22 1:29 PM  [facility] [address] [place], [place], [address] Phone: [phone]   Fax:  [phone]

## 2022-11-15 ENCOUNTER — Encounter: Payer: Medicare HMO | Admitting: Physical Therapy

## 2023-02-19 ENCOUNTER — Other Ambulatory Visit (INDEPENDENT_AMBULATORY_CARE_PROVIDER_SITE_OTHER): Payer: Self-pay

## 2023-02-19 ENCOUNTER — Ambulatory Visit: Payer: Medicare HMO | Admitting: Orthopaedic Surgery

## 2023-02-19 DIAGNOSIS — M25551 Pain in right hip: Secondary | ICD-10-CM

## 2023-02-19 NOTE — Progress Notes (Signed)
 The patient is well-known to us .  He is over 6 months out from a left total knee replacement and that has done well.  He is 78 years old.  He comes in today with right hip pain but he says the pain is off and on and it is more when he is going up and down stairs and he points to the trochanteric area and radiates into his right thigh.  This is on the right side.  He denies any right groin pain.  Denies any numbness and tingling in his feet or any back pain.  On exam his right hip moves smoothly and fluidly.  There is no pain to palpation of the trochanteric area or the IT band today and he has a negative straight leg raise.  He does have valgus malalignment of his right knee.  X-rays today of the pelvis and right hip show normal-appearing hips bilaterally.  I did look at standing x-rays of his knees from November of this past year and it does show some valgus malalignment of his right knee and this may be affecting how he walks.  This point I would like to send him to outpatient physical therapy for a PT evaluation in terms of looking at his gait and seeing if there are any modalities they can recommend to help decrease the pain that he is experiencing of his right trochanteric area and right thigh.  Any modalities per the therapist discretion or welcome.  We will then see him back in about 6 weeks to see how he is doing overall but no x-rays are needed.

## 2023-02-20 ENCOUNTER — Other Ambulatory Visit: Payer: Self-pay

## 2023-02-20 DIAGNOSIS — M25551 Pain in right hip: Secondary | ICD-10-CM

## 2023-02-27 ENCOUNTER — Ambulatory Visit: Payer: Medicare HMO | Admitting: Rehabilitative and Restorative Service Providers"

## 2023-02-27 ENCOUNTER — Encounter: Payer: Self-pay | Admitting: Rehabilitative and Restorative Service Providers"

## 2023-02-27 DIAGNOSIS — M6281 Muscle weakness (generalized): Secondary | ICD-10-CM | POA: Diagnosis not present

## 2023-02-27 DIAGNOSIS — M25552 Pain in left hip: Secondary | ICD-10-CM

## 2023-02-27 DIAGNOSIS — M25551 Pain in right hip: Secondary | ICD-10-CM

## 2023-02-27 DIAGNOSIS — R262 Difficulty in walking, not elsewhere classified: Secondary | ICD-10-CM | POA: Diagnosis not present

## 2023-02-27 NOTE — Therapy (Incomplete)
 OUTPATIENT PHYSICAL THERAPY LOWER EXTREMITY EVALUATION   Patient Name: Eddie Young MRN: [id number] DOB:Dec 20, 1945, 78 y.o., male Today's Date: 02/27/2023  END OF SESSION:  PT End of Session - 02/27/23 1713     Visit Number 1    Number of Visits 20    Date for PT Re-Evaluation 04/28/23    Authorization Type [employer] $10 copay    Progress Note Due on Visit 10    PT Start Time 1600    PT Stop Time 1645    PT Time Calculation (min) 45 min    Activity Tolerance Patient tolerated treatment well;No increased pain    Behavior During Therapy WFL for tasks assessed/performed             Past Medical History:  Diagnosis Date   Arthritis    GERD (gastroesophageal reflux disease)    Hyperlipidemia    Hypothyroidism    Macular degeneration    Thyroid disease    hyper active   Past Surgical History:  Procedure Laterality Date   ANKLE ARTHROSCOPY WITH REPAIR SUBLUXING TENDON     KNEE CARTILAGE SURGERY     SHOULDER ARTHROSCOPY W/ ROTATOR CUFF REPAIR     TOTAL KNEE ARTHROPLASTY Left 09/01/2022   Procedure: LEFT TOTAL KNEE ARTHROPLASTY;  Surgeon: Kathryne Hitch, MD;  Location: [facility];  Service: Orthopedics;  Laterality: Left;   Patient Active Problem List   Diagnosis Date Noted   Status post total left knee replacement 09/01/2022   Unilateral primary osteoarthritis, right knee 06/21/2022   AC (acromioclavicular) arthritis 08/26/2019   Impingement syndrome of left shoulder 08/26/2019   Biceps tendonitis on left 08/26/2019   Pain in left shoulder 10/28/2018    PCP: Gwenlyn Found, MD   REFERRING PROVIDER: Kathryne Hitch*   REFERRING DIAG:  M01.027 (ICD-10-CM) - Pain in right hip   THERAPY DIAG:  Pain in right hip  Pain in left hip  Difficulty in walking, not elsewhere classified  Muscle weakness (generalized)  Rationale for Evaluation and Treatment: Rehabilitation  ONSET DATE: 1 month SUBJECTIVE:   SUBJECTIVE STATEMENT: Has R  hip pain that sometimes goes down into the thigh area. Endorses some R groin pain. Has the most pain when going up steps.   PERTINENT HISTORY: L TKA (08/2022), arthriris, GERD, thyroid disease, macular degeneration, HLD, ankle arthroscopy, shoulder arthro with RTC repair  PAIN:  NPRS scale: 0/10 currently, at worst 7/10 Pain location: R hip Pain description: sharp, achy Aggravating factors: stairs Relieving factors: sitting down, rest  PRECAUTIONS: None  WEIGHT BEARING RESTRICTIONS: No  FALLS:  Has patient fallen in last 6 months? No  LIVING ENVIRONMENT: Lives with: lives with their spouse Lives in: House/apartment Stairs: Yes: External: 3 steps; bilateral but cannot reach both Has following equipment at home: Dan Humphreys - 2 wheeled  OCCUPATION: retired Airline pilot   PLOF: Independent  PATIENT GOALS: "find out what's going on." Walking long distances, being more active.   Next MD visit: 04/02/23 Dr. Magnus Ivan   OBJECTIVE:   DIAGNOSTIC FINDINGS:  per epic 02/19/23 x-ray:"An AP pelvis and lateral the right hip shows normal-appearing hip joint spaces bilaterally."  PATIENT SURVEYS:   Patient-specific activity scoring scheme   "0" represents "unable to perform." "10" represents "able to perform at prior level. 0 1 2 3 4 5 6 7 8 9  10 (Date and Score) Activity Initial  Activity Eval- 02/27/23    Stairs 4    2. Walking 4    3. Squatting/ bending to  pick up items 6   4.    5.     Total score = sum of the activity scores/number of activities Minimum detectable change (90%CI) for average score = 2 points Minimum detectable change (90%CI) for single activity score = 3 points  Score: 4.66/14 (02/27/2023)   COGNITION: Overall cognitive status: WFL    SENSATION: WFL   MUSCLE LENGTH: Quadriceps tightness noted during ely and supine hip flexor stretch   POSTURE:  Stiff flat back posture  PALPATION: Bilaterally: non-tender to acetabulum, ITB, quad, or glutes.   LOWER  EXTREMITY ROM:   ROM Right eval Left eval  Hip flexion    Hip extension    Hip abduction    Hip adduction    Hip internal rotation    Hip external rotation    Knee flexion    Knee extension    Ankle dorsiflexion    Ankle plantarflexion    Ankle inversion    Ankle eversion     (Blank rows = not tested)  LOWER EXTREMITY MMT:  MMT Right eval Left eval  Hip flexion 3/5 3+  Hip extension 3-/5 3-/5  Hip abduction 3+/5 4/5  Hip adduction 3/5 3/5  Hip internal rotation    Hip external rotation    Knee flexion 4/5 4/5  Knee extension 5/5 5/5  Ankle dorsiflexion 5/5 5/5  Ankle plantarflexion    Ankle inversion    Ankle eversion     (Blank rows = not tested)  LOWER EXTREMITY SPECIAL TESTS:  Hip special tests: Ely's test: positive bilaterally with R showing increased tightness compared to L  FUNCTIONAL TESTS:  None tested  GAIT: Distance walked: clinical distances Assistive device utilized: None Level of assistance: Complete Independence Comments: R knee genu valgum, decreased step length possibly due to decreased flexibility in hip flexors preventing adequate extension of stance leg.                                                                                                                                                                         TODAY'S TREATMENT                                                                          DATE: 02/27/2023 Therex:    HEP instruction/performance c cues for techniques, handout provided.  Trial set performed of each for comprehension and symptom assessment.  See below for exercise list  PATIENT EDUCATION:  Education details: HEP,  POC Person educated: Patient Education method: Explanation, Demonstration, Verbal cues, and Handouts Education comprehension: verbalized understanding, returned demonstration, and verbal cues required  HOME EXERCISE PROGRAM: ***  ASSESSMENT:  CLINICAL IMPRESSION: Patient is a 78 y.o. who  comes to clinic with complaints of R hip pain as well as L hip pain with mobility, strength and movement coordination deficits that impair their ability to perform usual daily and recreational functional activities without increase difficulty/symptoms at this time.  Patient to benefit from skilled PT services to address impairments and limitations to improve to previous level of function without restriction secondary to condition.   OBJECTIVE IMPAIRMENTS: Abnormal gait, decreased endurance, decreased knowledge of condition, decreased mobility, difficulty walking, decreased ROM, decreased strength, hypomobility, increased fascial restrictions, impaired flexibility, and pain.   ACTIVITY LIMITATIONS: carrying, lifting, bending, standing, squatting, sleeping, stairs, and locomotion level  PARTICIPATION LIMITATIONS: cleaning, laundry, driving, community activity, and yard work  PERSONAL FACTORS: Age and 3+ comorbidities: see above  are also affecting patient's functional outcome.   REHAB POTENTIAL: Good  CLINICAL DECISION MAKING: Stable/uncomplicated  EVALUATION COMPLEXITY: Low   GOALS: Goals reviewed with patient? Yes  SHORT TERM GOALS: (target date for Short term goals are 3 weeks 03/20/2023)   1.  Patient will demonstrate independent use of home exercise program to maintain progress from in clinic treatments.  Goal status: New  LONG TERM GOALS: (target dates for all long term goals are 10 weeks 05/08/2023)   1. Patient will demonstrate/report pain at worst less than or equal to 2/10 to facilitate minimal limitation in daily activity secondary to pain symptoms.  Goal status: New   2. Patient will demonstrate independent use of home exercise program to facilitate ability to maintain/progress functional gains from skilled physical therapy services.  Goal status: New   3. Patient will demonstrate Patient specific functional scale avg > or = 8 to indicate reduced disability due to  condition.   Goal status: New   4.  Patient will demonstrate R LE MMT 5/5 throughout to faciltiate usual transfers, stairs, squatting at [facility] for daily life.   Goal status: New   5.  Patient will demonstrate or report negotiating steps of house with PSFS >/= 8/10 Goal status: New   6.  Patient will report ambulating community distances of >375ft with PSFS >/= 8/10 Goal status: New   7.  Patient will demonstrate STS without symptom reproduction or PSFS >/=8/10  Goal Status: New   PLAN:  PT FREQUENCY: 1-2x/week  PT DURATION: 10 weeks  PLANNED INTERVENTIONS: Can include 40347- PT Re-evaluation, 97110-Therapeutic exercises, 97530- Therapeutic activity, 97112- Neuromuscular re-education, 97535- Self Care, 97140- Manual therapy, [phone]- Gait training, [phone]- Orthotic Fit/training, [phone]- Canalith repositioning, [id number]- Aquatic Therapy, [phone]- Electrical stimulation (unattended), [phone]- Electrical stimulation (manual), [id number] Physical performance testing, 97016- Vasopneumatic device, [id number]- Ultrasound, [id number]- Traction (mechanical), [id number]- Ionotophoresis 4mg /ml Dexamethasone, Patient/Family education, Balance training, Stair training, Taping, Dry Needling, Joint mobilization, Joint manipulation, Spinal manipulation, Spinal mobilization, Scar mobilization, Vestibular training, Visual/preceptual remediation/compensation, DME instructions, Cryotherapy, and Moist heat.  All performed as medically necessary.  All included unless contraindicated  PLAN FOR NEXT SESSION: Review HEP knowledge/results.    Kristof Nadeem, Zacari Stiff, Student-PT 02/27/2023, 5:19 PM

## 2023-02-28 ENCOUNTER — Ambulatory Visit: Payer: Medicare HMO | Admitting: Physical Therapy

## 2023-03-06 ENCOUNTER — Encounter: Payer: Self-pay | Admitting: Rehabilitative and Restorative Service Providers"

## 2023-03-06 ENCOUNTER — Ambulatory Visit: Payer: Medicare HMO | Admitting: Rehabilitative and Restorative Service Providers"

## 2023-03-06 DIAGNOSIS — R262 Difficulty in walking, not elsewhere classified: Secondary | ICD-10-CM

## 2023-03-06 DIAGNOSIS — M6281 Muscle weakness (generalized): Secondary | ICD-10-CM

## 2023-03-06 DIAGNOSIS — M25552 Pain in left hip: Secondary | ICD-10-CM | POA: Diagnosis not present

## 2023-03-06 DIAGNOSIS — M25551 Pain in right hip: Secondary | ICD-10-CM

## 2023-03-06 NOTE — Therapy (Signed)
 OUTPATIENT PHYSICAL THERAPY  TREATMENT   Patient Name: Eddie Young MRN: [id number] DOB:29-Jul-1945, 78 y.o., male Today's Date: 03/06/2023  END OF SESSION:  PT End of Session - 03/06/23 1256     Visit Number 2    Number of Visits 20    Date for PT Re-Evaluation 04/28/23    Authorization Type [employer] $10 copay    Progress Note Due on Visit 10    PT Start Time 1256    PT Stop Time 1335    PT Time Calculation (min) 39 min    Activity Tolerance Patient tolerated treatment well    Behavior During Therapy WFL for tasks assessed/performed              Past Medical History:  Diagnosis Date   Arthritis    GERD (gastroesophageal reflux disease)    Hyperlipidemia    Hypothyroidism    Macular degeneration    Thyroid disease    hyper active   Past Surgical History:  Procedure Laterality Date   ANKLE ARTHROSCOPY WITH REPAIR SUBLUXING TENDON     KNEE CARTILAGE SURGERY     SHOULDER ARTHROSCOPY W/ ROTATOR CUFF REPAIR     TOTAL KNEE ARTHROPLASTY Left 09/01/2022   Procedure: LEFT TOTAL KNEE ARTHROPLASTY;  Surgeon: Kathryne Hitch, MD;  Location: [facility];  Service: Orthopedics;  Laterality: Left;   Patient Active Problem List   Diagnosis Date Noted   Status post total left knee replacement 09/01/2022   Unilateral primary osteoarthritis, right knee 06/21/2022   AC (acromioclavicular) arthritis 08/26/2019   Impingement syndrome of left shoulder 08/26/2019   Biceps tendonitis on left 08/26/2019   Pain in left shoulder 10/28/2018    PCP: Gwenlyn Found, MD   REFERRING PROVIDER: Kathryne Hitch*   REFERRING DIAG:  W09.811 (ICD-10-CM) - Pain in right hip   THERAPY DIAG:  Pain in right hip  Pain in left hip  Difficulty in walking, not elsewhere classified  Muscle weakness (generalized)  Rationale for Evaluation and Treatment: Rehabilitation  ONSET DATE: 1 month SUBJECTIVE:   SUBJECTIVE STATEMENT: Pt indicated having complaint in both  thighs with walking trashcans back, fairly level.  Pt indicated otherwise doing similar.   PERTINENT HISTORY: Lt TKA (08/2022), arthriris, GERD, thyroid disease, macular degeneration, HLD, ankle arthroscopy, shoulder arthro with RTC repair  PAIN:  NPRS scale: 6/10 at most Pain location: Rt and Lt anterior thigh Pain description: "painful"not sharp or cramping Aggravating factors: stairs Relieving factors: sitting down, rest  PRECAUTIONS: None  WEIGHT BEARING RESTRICTIONS: No  FALLS:  Has patient fallen in last 6 months? No  LIVING ENVIRONMENT: Lives with: lives with their spouse Lives in: House/apartment Stairs: Yes: External: 3 steps; bilateral but cannot reach both Has following equipment at home: Dan Humphreys - 2 wheeled  OCCUPATION: retired Airline pilot   PLOF: Independent  PATIENT GOALS: "find out what's going on." Walking long distances, being more active.   Next MD visit: 04/02/23 Dr. Magnus Ivan   OBJECTIVE:   DIAGNOSTIC FINDINGS:  per epic 02/19/23 x-ray:"An AP pelvis and lateral the right hip shows normal-appearing hip joint spaces bilaterally."  PATIENT SURVEYS:   Patient-specific activity scoring scheme   "0" represents "unable to perform." "10" represents "able to perform at prior level. 0 1 2 3 4 5 6 7 8 9  10 (Date and Score) Activity Initial  Activity Eval- 02/27/23    Stairs 4    2. Walking 4    3. Squatting/ bending to pick up items 6  4.    5.     Total score = sum of the activity scores/number of activities Minimum detectable change (90%CI) for average score = 2 points Minimum detectable change (90%CI) for single activity score = 3 points  Score: 4.66 avg (02/27/2023)   COGNITION: 02/27/2023 Overall cognitive status: WFL    SENSATION: 02/27/2023 WFL   MUSCLE LENGTH: 02/27/2023 (+) ely's, thomas test bilateral, more on Rt than Lt.   POSTURE:  02/27/2023 Stiff flat back posture  PALPATION: 02/27/2023 Bilaterally: non-tender to acetabulum,  ITB, quad, or glutes.   LUMBAR AROM: 03/06/2023: Lumbar extension : 50 % WFL  LOWER EXTREMITY ROM:   ROM Right eval Left eval  Hip flexion    Hip extension    Hip abduction    Hip adduction    Hip internal rotation    Hip external rotation    Knee flexion    Knee extension    Ankle dorsiflexion    Ankle plantarflexion    Ankle inversion    Ankle eversion     (Blank rows = not tested)  LOWER EXTREMITY MMT:  MMT Right 02/27/2023 Left 02/27/2023  Hip flexion 3/5 3+  Hip extension 3-/5 3-/5  Hip abduction 3+/5 4/5  Hip adduction 3/5 3/5  Hip internal rotation    Hip external rotation    Knee flexion 4/5 4/5  Knee extension 5/5 5/5  Ankle dorsiflexion 5/5 5/5  Ankle plantarflexion    Ankle inversion    Ankle eversion     (Blank rows = not tested)   SPECIAL TESTS:  02/27/2023 Hip special tests: Ely's test: positive bilaterally with R showing increased tightness compared to L  FUNCTIONAL TESTS:  02/27/2023 None tested  GAIT: 02/27/2023 Distance walked: clinical distances Assistive device utilized: None Level of assistance: Complete Independence Comments: R knee genu valgum, decreased step length possibly due to decreased flexibility in hip flexors preventing adequate extension of stance leg.                                                                                                                                                                         TODAY'S TREATMENT                                                                          DATE: 03/06/2023 Therex: Nustep lvl 6 10 mins UE/LE Standing lumbar extension AROM x 5  Supine hooklying bridge c tband hip abduction hold blue band  2 x 10  Thomas stretch 30 sec x 3 Rt leg  Verbal review of existing HEP.    TherActivity (to improve transfers, stairs and other progressive mobility ability ) Leg press double leg 100 lbs x 15, single leg 2 x 15 performed bilaterally     TODAY'S TREATMENT                                                                           DATE: 02/27/2023 Therex:    HEP instruction/performance c cues for techniques, handout provided.  Trial set performed of each for comprehension and symptom assessment.  See below for exercise list  PATIENT EDUCATION:  Education details: HEP, POC Person educated: Patient Education method: Explanation, Demonstration, Verbal cues, and Handouts Education comprehension: verbalized understanding, returned demonstration, and verbal cues required  HOME EXERCISE PROGRAM: Access Code: [facility] URL: [email or web address] Date: 02/28/2023 Prepared by: Chyrel Masson   Exercises - Standing Lumbar Extension with Counter  - 3-5 x daily - 7 x weekly - 1 sets - 5-10 reps - Supine Bridge  - 1-2 x daily - 7 x weekly - 1-2 sets - 10 reps - 2 hold - Hip Flexor Stretch at Edge of Bed  - 1-2 x daily - 7 x weekly - 1 sets - 5 reps - 15 hold - Sidelying Hip Abduction  - 1-2 x daily - 7 x weekly - 1-2 sets - 10-15 reps  ASSESSMENT:  CLINICAL IMPRESSION: Good knowledge in review of existing HEP.  Pt to benefit from continued strengthening for bilateral LE to improve strength and endurance in daily activity.  Pt out of town and will return in 10 days.  Will reassess symptom presentation.   OBJECTIVE IMPAIRMENTS: Abnormal gait, decreased endurance, decreased knowledge of condition, decreased mobility, difficulty walking, decreased ROM, decreased strength, hypomobility, increased fascial restrictions, impaired flexibility, and pain.   ACTIVITY LIMITATIONS: carrying, lifting, bending, standing, squatting, sleeping, stairs, and locomotion level  PARTICIPATION LIMITATIONS: cleaning, laundry, driving, community activity, and yard work  PERSONAL FACTORS: Age and 3+ comorbidities: GERD, hyperlipidemia, hypothyroidism  are also affecting patient's functional outcome.   REHAB POTENTIAL: Good  CLINICAL DECISION MAKING:  Stable/uncomplicated  EVALUATION COMPLEXITY: Low   GOALS: Goals reviewed with patient? Yes  SHORT TERM GOALS: (target date for Short term goals are 3 weeks 03/20/2023)   1.  Patient will demonstrate independent use of home exercise program to maintain progress from in clinic treatments.  Goal status: on going 03/06/2023  LONG TERM GOALS: (target dates for all long term goals are 10 weeks 05/08/2023)   1. Patient will demonstrate/report pain at worst less than or equal to 2/10 to facilitate minimal limitation in daily activity secondary to pain symptoms.  Goal status: New   2. Patient will demonstrate independent use of home exercise program to facilitate ability to maintain/progress functional gains from skilled physical therapy services.  Goal status: New   3. Patient will demonstrate Patient specific functional scale avg > or = 8 to indicate reduced disability due to condition.   Goal status: New   4.  Patient will demonstrate bilateral LE MMT 5/5 throughout to faciltiate usual transfers, stairs, squatting at [facility] for daily life.   Goal status: New  5.  Patient will demonstrate lumbar extension AROM > or = 50% to facilitate improved mobility.  6. Patient will demonstrate improved hip flexor/quad mobility bilateral to demonstrate negative muscle length testing for improved mobility in activity.    PLAN:  PT FREQUENCY: 1-2x/week  PT DURATION: 10 weeks  PLANNED INTERVENTIONS: Can include 09811- PT Re-evaluation, 97110-Therapeutic exercises, 97530- Therapeutic activity, 97112- Neuromuscular re-education, 97535- Self Care, 97140- Manual therapy, [phone]- Gait training, [phone]- Orthotic Fit/training, [phone]- Canalith repositioning, [id number]- Aquatic Therapy, [phone]- Electrical stimulation (unattended), [phone]- Electrical stimulation (manual), [id number] Physical performance testing, 97016- Vasopneumatic device, [id number]- Ultrasound, [id number]- Traction (mechanical), [id number]- Ionotophoresis 4mg /ml  Dexamethasone, Patient/Family education, Balance training, Stair training, Taping, Dry Needling, Joint mobilization, Joint manipulation, Spinal manipulation, Spinal mobilization, Scar mobilization, Vestibular training, Visual/preceptual remediation/compensation, DME instructions, Cryotherapy, and Moist heat.  All performed as medically necessary.  All included unless contraindicated  PLAN FOR NEXT SESSION: Recheck strength ad  objective data.    Chyrel Masson, PT, DPT, OCS, ATC 03/06/23  1:37 PM

## 2023-03-16 ENCOUNTER — Encounter: Payer: Self-pay | Admitting: Rehabilitative and Restorative Service Providers"

## 2023-03-16 ENCOUNTER — Ambulatory Visit: Payer: Medicare HMO | Admitting: Rehabilitative and Restorative Service Providers"

## 2023-03-16 DIAGNOSIS — R262 Difficulty in walking, not elsewhere classified: Secondary | ICD-10-CM | POA: Diagnosis not present

## 2023-03-16 DIAGNOSIS — M6281 Muscle weakness (generalized): Secondary | ICD-10-CM | POA: Diagnosis not present

## 2023-03-16 DIAGNOSIS — M25551 Pain in right hip: Secondary | ICD-10-CM | POA: Diagnosis not present

## 2023-03-16 DIAGNOSIS — M25552 Pain in left hip: Secondary | ICD-10-CM | POA: Diagnosis not present

## 2023-03-16 NOTE — Therapy (Signed)
 OUTPATIENT PHYSICAL THERAPY  TREATMENT   Patient Name: Eddie Young MRN: [id number] DOB:July 09, 1945, 78 y.o., male Today's Date: 03/16/2023  END OF SESSION:  PT End of Session - 03/16/23 0931     Visit Number 3    Number of Visits 20    Date for PT Re-Evaluation 04/28/23    Authorization Type [employer] $10 copay    Progress Note Due on Visit 10    PT Start Time 0931    PT Stop Time 1014    PT Time Calculation (min) 43 min    Activity Tolerance Patient tolerated treatment well;No increased pain    Behavior During Therapy WFL for tasks assessed/performed              Past Medical History:  Diagnosis Date   Arthritis    GERD (gastroesophageal reflux disease)    Hyperlipidemia    Hypothyroidism    Macular degeneration    Thyroid disease    hyper active   Past Surgical History:  Procedure Laterality Date   ANKLE ARTHROSCOPY WITH REPAIR SUBLUXING TENDON     KNEE CARTILAGE SURGERY     SHOULDER ARTHROSCOPY W/ ROTATOR CUFF REPAIR     TOTAL KNEE ARTHROPLASTY Left 09/01/2022   Procedure: LEFT TOTAL KNEE ARTHROPLASTY;  Surgeon: Eddie Hitch, MD;  Location: [facility];  Service: Orthopedics;  Laterality: Left;   Patient Active Problem List   Diagnosis Date Noted   Status post total left knee replacement 09/01/2022   Unilateral primary osteoarthritis, right knee 06/21/2022   AC (acromioclavicular) arthritis 08/26/2019   Impingement syndrome of left shoulder 08/26/2019   Biceps tendonitis on left 08/26/2019   Pain in left shoulder 10/28/2018    PCP: Eddie Found, MD   REFERRING PROVIDER: Kathryne Young*   REFERRING DIAG:  U72.536 (ICD-10-CM) - Pain in right hip   THERAPY DIAG:  Pain in right hip  Pain in left hip  Difficulty in walking, not elsewhere classified  Muscle weakness (generalized)  Rationale for Evaluation and Treatment: Rehabilitation  ONSET DATE: 1 month SUBJECTIVE:   SUBJECTIVE STATEMENT: Eddie Young reports  compliance with his early home exercise program.  Bilateral quadriceps soreness appears to be his biggest concern today.  PERTINENT HISTORY: Lt TKA (08/2022), arthriris, GERD, thyroid disease, macular degeneration, HLD, ankle arthroscopy, shoulder arthro with RTC repair  PAIN:  NPRS scale: 2-6/10 this week Pain location: Rt and Lt anterior thigh Pain description: "painful" not sharp or cramping Aggravating factors: stairs Relieving factors: sitting down, rest  PRECAUTIONS: None  WEIGHT BEARING RESTRICTIONS: No  FALLS:  Has patient fallen in last 6 months? No  LIVING ENVIRONMENT: Lives with: lives with their spouse Lives in: House/apartment Stairs: Yes: External: 3 steps; bilateral but cannot reach both Has following equipment at home: Dan Humphreys - 2 wheeled  OCCUPATION: retired Airline pilot   PLOF: Independent  PATIENT GOALS: "find out what's going on." Walking long distances, being more active.   Next MD visit: 04/02/23 Dr. Magnus Young   OBJECTIVE:   DIAGNOSTIC FINDINGS:  per epic 02/19/23 x-ray:"An AP pelvis and lateral the right hip shows normal-appearing hip joint spaces bilaterally."  PATIENT SURVEYS:   Patient-specific activity scoring scheme   "0" represents "unable to perform." "10" represents "able to perform at prior level. 0 1 2 3 4 5 6 7 8 9  10 (Date and Score) Activity Initial  Activity Eval- 02/27/23    Stairs 4    2. Walking 4    3. Squatting/ bending to pick up  items 6   4.    5.     Total score = sum of the activity scores/number of activities Minimum detectable change (90%CI) for average score = 2 points Minimum detectable change (90%CI) for single activity score = 3 points  Score: 4.66 avg (02/27/2023)   COGNITION: 02/27/2023 Overall cognitive status: WFL    SENSATION: 02/27/2023 WFL   MUSCLE LENGTH: 02/27/2023 (+) ely's, thomas test bilateral, more on Rt than Lt.   POSTURE:  02/27/2023 Stiff flat back  posture  PALPATION: 02/27/2023 Bilaterally: non-tender to acetabulum, ITB, quad, or glutes.   LUMBAR AROM: 03/06/2023: Lumbar extension : 50 % WFL  LOWER EXTREMITY ROM:   ROM Right eval Left eval  Hip flexion    Hip extension    Hip abduction    Hip adduction    Hip internal rotation    Hip external rotation    Knee flexion    Knee extension    Ankle dorsiflexion    Ankle plantarflexion    Ankle inversion    Ankle eversion     (Blank rows = not tested)  LOWER EXTREMITY MMT:  MMT Right 02/27/2023 Left 02/27/2023  Hip flexion 3/5 3+  Hip extension 3-/5 3-/5  Hip abduction 3+/5 4/5  Hip adduction 3/5 3/5  Hip internal rotation    Hip external rotation    Knee flexion 4/5 4/5  Knee extension 5/5 5/5  Ankle dorsiflexion 5/5 5/5  Ankle plantarflexion    Ankle inversion    Ankle eversion     (Blank rows = not tested)   SPECIAL TESTS:  02/27/2023 Hip special tests: Ely's test: positive bilaterally with R showing increased tightness compared to L  FUNCTIONAL TESTS:  02/27/2023 None tested  GAIT: 02/27/2023 Distance walked: clinical distances Assistive device utilized: None Level of assistance: Complete Independence Comments: R knee genu valgum, decreased step length possibly due to decreased flexibility in hip flexors preventing adequate extension of stance leg.                                                                                                                                                                         TODAY'S TREATMENT                                                                          DATE:  03/16/2023 Seated straight leg raises, 4 inch lift, quad set 1st, pause 2 seconds, lift 4 inches, hold 2 seconds,  slow eccentrics 3 sets of 5 Lumbar extension AROM 10 x 3 seconds each for hips forward (lower lumbar) and lean back (upper lumbar) Bridging with feet all the way back 10 x 2 second Hip flexor stretch at edge of bed 5 x 15 seconds  bilateral Side-lie hip abduction 10 x 3 seconds (1/8 turn from your stomach)  Functional Activities: Double Leg Press 100# slow eccentrics 15 x Single Leg Press 75# slow eccentrics 10 x each side   03/06/2023 Therex: Nustep lvl 6 10 mins UE/LE Standing lumbar extension AROM x 5  Supine hooklying bridge c tband hip abduction hold blue band 2 x 10  Thomas stretch 30 sec x 3 Rt leg  Verbal review of existing HEP.    TherActivity (to improve transfers, stairs and other progressive mobility ability ) Leg press double leg 100 lbs x 15, single leg 2 x 15 performed bilaterally    02/27/2023 Therex:    HEP instruction/performance c cues for techniques, handout provided.  Trial set performed of each for comprehension and symptom assessment.  See below for exercise list  PATIENT EDUCATION:  Education details: HEP, POC Person educated: Patient Education method: Explanation, Demonstration, Verbal cues, and Handouts Education comprehension: verbalized understanding, returned demonstration, and verbal cues required  HOME EXERCISE PROGRAM: Access Code: [facility] URL: [email or web address] Date: 03/16/2023 Prepared by: Pauletta Browns  Exercises - Standing Lumbar Extension with Counter  - 3-5 x daily - 7 x weekly - 1 sets - 5-10 reps - Supine Bridge  - 1-2 x daily - 7 x weekly - 1-2 sets - 10 reps - 2 hold - Hip Flexor Stretch at Edge of Bed  - 1-2 x daily - 7 x weekly - 1 sets - 5 reps - 15 hold - Sidelying Hip Abduction  - 1-2 x daily - 7 x weekly - 1-2 sets - 10-15 reps - Standing Lumbar Extension at Wall - Forearms  - 3-5 x daily - 7 x weekly - 1 sets - 5-10 reps - 3 seconds hold - Small Range Straight Leg Raise  - 2 x daily - 7 x weekly - 3-5 sets - 5 reps - 3 seconds hold   ASSESSMENT:  CLINICAL IMPRESSION: Kyair is doing a good job with his current home exercises.  We did make some slight modifications to try to recruit more quadriceps activation including adding a  seated straight leg raise exercise while emphasizing good posture.  Roland really felt his quadriceps working with this new exercise and with minor modifications to his current program and he appears on track to meet the below listed goals within the recommended amount of time.  OBJECTIVE IMPAIRMENTS: Abnormal gait, decreased endurance, decreased knowledge of condition, decreased mobility, difficulty walking, decreased ROM, decreased strength, hypomobility, increased fascial restrictions, impaired flexibility, and pain.   ACTIVITY LIMITATIONS: carrying, lifting, bending, standing, squatting, sleeping, stairs, and locomotion level  PARTICIPATION LIMITATIONS: cleaning, laundry, driving, community activity, and yard work  PERSONAL FACTORS: Age and 3+ comorbidities: GERD, hyperlipidemia, hypothyroidism  are also affecting patient's functional outcome.   REHAB POTENTIAL: Good  CLINICAL DECISION MAKING: Stable/uncomplicated  EVALUATION COMPLEXITY: Low   GOALS: Goals reviewed with patient? Yes  SHORT TERM GOALS: (target date for Short term goals are 3 weeks 03/20/2023)   1.  Patient will demonstrate independent use of home exercise program to maintain progress from in clinic treatments.  Goal status: Met 03/16/2023  LONG TERM GOALS: (target dates for all long term goals are 10 weeks 05/08/2023)  1. Patient will demonstrate/report pain at worst less than or equal to 2/10 to facilitate minimal limitation in daily activity secondary to pain symptoms.  Goal status: New   2. Patient will demonstrate independent use of home exercise program to facilitate ability to maintain/progress functional gains from skilled physical therapy services.  Goal status: New   3. Patient will demonstrate Patient specific functional scale avg > or = 8 to indicate reduced disability due to condition.   Goal status: New   4.  Patient will demonstrate bilateral LE MMT 5/5 throughout to faciltiate usual transfers,  stairs, squatting at [facility] for daily life.   Goal status: New   5.  Patient will demonstrate lumbar extension AROM > or = 50% to facilitate improved mobility.  6. Patient will demonstrate improved hip flexor/quad mobility bilateral to demonstrate negative muscle length testing for improved mobility in activity.    PLAN:  PT FREQUENCY: 1-2x/week  PT DURATION: 10 weeks  PLANNED INTERVENTIONS: Can include 16109- PT Re-evaluation, 97110-Therapeutic exercises, 97530- Therapeutic activity, 97112- Neuromuscular re-education, 97535- Self Care, 97140- Manual therapy, [phone]- Gait training, [phone]- Orthotic Fit/training, [phone]- Canalith repositioning, [id number]- Aquatic Therapy, [phone]- Electrical stimulation (unattended), [phone]- Electrical stimulation (manual), [id number] Physical performance testing, 97016- Vasopneumatic device, [id number]- Ultrasound, [id number]- Traction (mechanical), [id number]- Ionotophoresis 4mg /ml Dexamethasone, Patient/Family education, Balance training, Stair training, Taping, Dry Needling, Joint mobilization, Joint manipulation, Spinal manipulation, Spinal mobilization, Scar mobilization, Vestibular training, Visual/preceptual remediation/compensation, DME instructions, Cryotherapy, and Moist heat.  All performed as medically necessary.  All included unless contraindicated  PLAN FOR NEXT SESSION: Re-check strength and objective data.  Quadriceps strength progressions, while still addressing posture and back/hip concerns.   Cherlyn Cushing PT, MPT 03/16/23  3:41 PM

## 2023-03-20 ENCOUNTER — Ambulatory Visit: Payer: Medicare HMO | Admitting: Physical Therapy

## 2023-03-20 ENCOUNTER — Encounter: Payer: Self-pay | Admitting: Physical Therapy

## 2023-03-20 DIAGNOSIS — R262 Difficulty in walking, not elsewhere classified: Secondary | ICD-10-CM | POA: Diagnosis not present

## 2023-03-20 DIAGNOSIS — M6281 Muscle weakness (generalized): Secondary | ICD-10-CM

## 2023-03-20 DIAGNOSIS — M25551 Pain in right hip: Secondary | ICD-10-CM

## 2023-03-20 DIAGNOSIS — M25552 Pain in left hip: Secondary | ICD-10-CM

## 2023-03-20 NOTE — Therapy (Addendum)
 OUTPATIENT PHYSICAL THERAPY  TREATMENT   Patient Name: Eddie Young MRN: [id number] DOB:1945-02-12, 78 y.o., male Today's Date: 03/20/2023  END OF SESSION:  PT End of Session - 03/20/23 1007     Visit Number 4    Number of Visits 20    Date for PT Re-Evaluation 04/28/23    Authorization Type [employer] $10 copay    Progress Note Due on Visit 10    PT Start Time 0932    PT Stop Time 1015    PT Time Calculation (min) 43 min    Activity Tolerance Patient tolerated treatment well;No increased pain    Behavior During Therapy WFL for tasks assessed/performed               Past Medical History:  Diagnosis Date   Arthritis    GERD (gastroesophageal reflux disease)    Hyperlipidemia    Hypothyroidism    Macular degeneration    Thyroid disease    hyper active   Past Surgical History:  Procedure Laterality Date   ANKLE ARTHROSCOPY WITH REPAIR SUBLUXING TENDON     KNEE CARTILAGE SURGERY     SHOULDER ARTHROSCOPY W/ ROTATOR CUFF REPAIR     TOTAL KNEE ARTHROPLASTY Left 09/01/2022   Procedure: LEFT TOTAL KNEE ARTHROPLASTY;  Surgeon: Kathryne Hitch, MD;  Location: [facility];  Service: Orthopedics;  Laterality: Left;   Patient Active Problem List   Diagnosis Date Noted   Status post total left knee replacement 09/01/2022   Unilateral primary osteoarthritis, right knee 06/21/2022   AC (acromioclavicular) arthritis 08/26/2019   Impingement syndrome of left shoulder 08/26/2019   Biceps tendonitis on left 08/26/2019   Pain in left shoulder 10/28/2018    PCP: Gwenlyn Found, MD   REFERRING PROVIDER: Kathryne Hitch*   REFERRING DIAG:  W09.811 (ICD-10-CM) - Pain in right hip   THERAPY DIAG:  Pain in right hip  Pain in left hip  Difficulty in walking, not elsewhere classified  Muscle weakness (generalized)  Rationale for Evaluation and Treatment: Rehabilitation  ONSET DATE: 1 month SUBJECTIVE:   SUBJECTIVE STATEMENT: Pt arriving today  reporting bilateral thigh pain and groin pain at 3/10.   PERTINENT HISTORY: Lt TKA (08/2022), arthriris, GERD, thyroid disease, macular degeneration, HLD, ankle arthroscopy, shoulder arthro with RTC repair  PAIN:  NPRS scale: 3/10 today Pain location: Rt and Lt anterior thigh Pain description: "painful" not sharp or cramping Aggravating factors: stairs Relieving factors: sitting down, rest  PRECAUTIONS: None  WEIGHT BEARING RESTRICTIONS: No  FALLS:  Has patient fallen in last 6 months? No  LIVING ENVIRONMENT: Lives with: lives with their spouse Lives in: House/apartment Stairs: Yes: External: 3 steps; bilateral but cannot reach both Has following equipment at home: Dan Humphreys - 2 wheeled  OCCUPATION: retired Airline pilot   PLOF: Independent  PATIENT GOALS: "find out what's going on." Walking long distances, being more active.   Next MD visit: 04/02/23 Dr. Magnus Ivan   OBJECTIVE:   DIAGNOSTIC FINDINGS:  per epic 02/19/23 x-ray:"An AP pelvis and lateral the right hip shows normal-appearing hip joint spaces bilaterally."  PATIENT SURVEYS:   Patient-specific activity scoring scheme   "0" represents "unable to perform." "10" represents "able to perform at prior level. 0 1 2 3 4 5 6 7 8 9  10 (Date and Score) Activity Initial  Activity Eval- 02/27/23 03/20/23   Stairs 4 8   2. Walking 4 8   3. Squatting/ bending to pick up items 6 6  4.  5.     Total score = sum of the activity scores/number of activities Minimum detectable change (90%CI) for average score = 2 points Minimum detectable change (90%CI) for single activity score = 3 points  Score: 4.66 avg (02/27/2023)   COGNITION: 02/27/2023 Overall cognitive status: WFL    SENSATION: 02/27/2023 WFL   MUSCLE LENGTH: 02/27/2023 (+) ely's, thomas test bilateral, more on Rt than Lt.   POSTURE:  02/27/2023 Stiff flat back posture  PALPATION: 02/27/2023 Bilaterally: non-tender to acetabulum, ITB, quad, or glutes.    LUMBAR AROM: 03/06/2023: Lumbar extension : 50 % WFL  LOWER EXTREMITY ROM:   ROM Right eval Left eval  Hip flexion    Hip extension    Hip abduction    Hip adduction    Hip internal rotation    Hip external rotation    Knee flexion    Knee extension    Ankle dorsiflexion    Ankle plantarflexion    Ankle inversion    Ankle eversion     (Blank rows = not tested)  LOWER EXTREMITY MMT:  MMT Right 02/27/2023 Left 02/27/2023 Rt 03/20/23 Rt 03/20/23  Hip flexion 3/5 3+ 4- 4-  Hip extension 3-/5 3-/5 4- 4-  Hip abduction 3+/5 4/5 4- 4+  Hip adduction 3/5 3/5 4- 4-  Hip internal rotation      Hip external rotation      Knee flexion 4/5 4/5 4+ 4+  Knee extension 5/5 5/5 5 5   Ankle dorsiflexion 5/5 5/5 5 5   Ankle plantarflexion      Ankle inversion      Ankle eversion       (Blank rows = not tested)   SPECIAL TESTS:  02/27/2023 Hip special tests: Ely's test: positive bilaterally with R showing increased tightness compared to L  FUNCTIONAL TESTS:  02/27/2023 None tested  GAIT: 02/27/2023 Distance walked: clinical distances Assistive device utilized: None Level of assistance: Complete Independence Comments: R knee genu valgum, decreased step length possibly due to decreased flexibility in hip flexors preventing adequate extension of stance leg.                                                                                                                                                                         TODAY'S TREATMENT                                                                          DATE:  03/20/23: TherEx: Nustep: level 6 x 11 minutes Standing hip hiking: x 20 bil LE c UE support Standing hip extension x 15 bil LE c UE support Seated SLR: 2 x 10 bil LE holidng 5 sec Knee flexion machine: single 35# 2 x 10 bil LE Functional Activities: Double Leg Press 106# slow eccentrics 15 x Single Leg Press 75# slow eccentrics 10 x each  side      03/16/2023 Seated straight leg raises, 4 inch lift, quad set 1st, pause 2 seconds, lift 4 inches, hold 2 seconds, slow eccentrics 3 sets of 5 Lumbar extension AROM 10 x 3 seconds each for hips forward (lower lumbar) and lean back (upper lumbar) Bridging with feet all the way back 10 x 2 second Hip flexor stretch at edge of bed 5 x 15 seconds bilateral Side-lie hip abduction 10 x 3 seconds (1/8 turn from your stomach)  Functional Activities: Double Leg Press 100# slow eccentrics 15 x Single Leg Press 75# slow eccentrics 10 x each side   03/06/2023 Therex: Nustep lvl 6 10 mins UE/LE Standing lumbar extension AROM x 5  Supine hooklying bridge c tband hip abduction hold blue band 2 x 10  Thomas stretch 30 sec x 3 Rt leg  Verbal review of existing HEP.    TherActivity (to improve transfers, stairs and other progressive mobility ability ) Leg press double leg 100 lbs x 15, single leg 2 x 15 performed bilaterally    02/27/2023 Therex:    HEP instruction/performance c cues for techniques, handout provided.  Trial set performed of each for comprehension and symptom assessment.  See below for exercise list  PATIENT EDUCATION:  Education details: HEP, POC Person educated: Patient Education method: Explanation, Demonstration, Verbal cues, and Handouts Education comprehension: verbalized understanding, returned demonstration, and verbal cues required  HOME EXERCISE PROGRAM: Access Code: [facility] URL: [email or web address] Date: 03/16/2023 Prepared by: Pauletta Browns  Exercises - Standing Lumbar Extension with Counter  - 3-5 x daily - 7 x weekly - 1 sets - 5-10 reps - Supine Bridge  - 1-2 x daily - 7 x weekly - 1-2 sets - 10 reps - 2 hold - Hip Flexor Stretch at Edge of Bed  - 1-2 x daily - 7 x weekly - 1 sets - 5 reps - 15 hold - Sidelying Hip Abduction  - 1-2 x daily - 7 x weekly - 1-2 sets - 10-15 reps - Standing Lumbar Extension at Wall - Forearms  - 3-5 x  daily - 7 x weekly - 1 sets - 5-10 reps - 3 seconds hold - Small Range Straight Leg Raise  - 2 x daily - 7 x weekly - 3-5 sets - 5 reps - 3 seconds hold   ASSESSMENT:  CLINICAL IMPRESSION: Pt arriving today still reporting bilateral quad soreness and mild groin pain. Pt has made improvements in his bilateral LE strength since beginning therapy.No LTG's met currently. 2 of pt's functional scores have doubled to 8. Continue with skilled PT interventions to maximize pt's function.    OBJECTIVE IMPAIRMENTS: Abnormal gait, decreased endurance, decreased knowledge of condition, decreased mobility, difficulty walking, decreased ROM, decreased strength, hypomobility, increased fascial restrictions, impaired flexibility, and pain.   ACTIVITY LIMITATIONS: carrying, lifting, bending, standing, squatting, sleeping, stairs, and locomotion level  PARTICIPATION LIMITATIONS: cleaning, laundry, driving, community activity, and yard work  PERSONAL FACTORS: Age and 3+ comorbidities: GERD, hyperlipidemia, hypothyroidism  are also affecting patient's functional outcome.   REHAB POTENTIAL: Good  CLINICAL DECISION MAKING: Stable/uncomplicated  EVALUATION COMPLEXITY: Low   GOALS: Goals reviewed with patient? Yes  SHORT TERM GOALS: (target date for Short term goals are 3 weeks 03/20/2023)   1.  Patient will demonstrate independent use of home exercise program to maintain progress from in clinic treatments.  Goal status: Met 03/16/2023  LONG TERM GOALS: (target dates for all long term goals are 10 weeks 05/08/2023)   1. Patient will demonstrate/report pain at worst less than or equal to 2/10 to facilitate minimal limitation in daily activity secondary to pain symptoms.  Goal status: on-going 03/20/23   2. Patient will demonstrate independent use of home exercise program to facilitate ability to maintain/progress functional gains from skilled physical therapy services.  Goal status: on-going 03/20/23    3. Patient will demonstrate Patient specific functional scale avg > or = 8 to indicate reduced disability due to condition.   Goal status: On-going 03/20/23   4.  Patient will demonstrate bilateral LE MMT 5/5 throughout to faciltiate usual transfers, stairs, squatting at [facility] for daily life.   Goal status: On-going 03/20/23   5.  Patient will demonstrate lumbar extension AROM > or = 50% to facilitate improved mobility.   Goal status: On-going 03/20/23  6. Patient will demonstrate improved hip flexor/quad mobility bilateral to demonstrate negative muscle length testing for improved mobility in activity.    Goal status On-going 03/20/23   PLAN:  PT FREQUENCY: 1-2x/week  PT DURATION: 10 weeks  PLANNED INTERVENTIONS: Can include 52841- PT Re-evaluation, 97110-Therapeutic exercises, 97530- Therapeutic activity, 97112- Neuromuscular re-education, 97535- Self Care, 97140- Manual therapy, [phone]- Gait training, [phone]- Orthotic Fit/training, [phone]- Canalith repositioning, [id number]- Aquatic Therapy, [phone]- Electrical stimulation (unattended), [phone]- Electrical stimulation (manual), [id number] Physical performance testing, 97016- Vasopneumatic device, [id number]- Ultrasound, [id number]- Traction (mechanical), [id number]- Ionotophoresis 4mg /ml Dexamethasone, Patient/Family education, Balance training, Stair training, Taping, Dry Needling, Joint mobilization, Joint manipulation, Spinal manipulation, Spinal mobilization, Scar mobilization, Vestibular training, Visual/preceptual remediation/compensation, DME instructions, Cryotherapy, and Moist heat.  All performed as medically necessary.  All included unless contraindicated  PLAN FOR NEXT SESSION: hip/knee strengthening, functional mobility and balance.    Narda Amber, PT, MPT 03/20/23 10:08 AM   03/20/23  10:08 AM

## 2023-03-22 ENCOUNTER — Ambulatory Visit: Payer: Medicare HMO | Admitting: Rehabilitative and Restorative Service Providers"

## 2023-03-22 ENCOUNTER — Encounter: Payer: Self-pay | Admitting: Rehabilitative and Restorative Service Providers"

## 2023-03-22 DIAGNOSIS — M25552 Pain in left hip: Secondary | ICD-10-CM

## 2023-03-22 DIAGNOSIS — R262 Difficulty in walking, not elsewhere classified: Secondary | ICD-10-CM | POA: Diagnosis not present

## 2023-03-22 DIAGNOSIS — M6281 Muscle weakness (generalized): Secondary | ICD-10-CM | POA: Diagnosis not present

## 2023-03-22 DIAGNOSIS — M25551 Pain in right hip: Secondary | ICD-10-CM

## 2023-03-22 NOTE — Therapy (Addendum)
 OUTPATIENT PHYSICAL THERAPY  TREATMENT  PHYSICAL THERAPY DISCHARGE SUMMARY  Visits from Start of Care: 5  Current functional level related to goals / functional outcomes: See note   Remaining deficits: See note   Education / Equipment: HEP   Patient agrees to discharge. Patient goals were partially met. Patient is being discharged due to not returning since the last visit.   Patient Name: Eddie Young MRN: [id number] DOB:1945-02-01, 78 y.o., male Today's Date: 03/22/2023  END OF SESSION:  PT End of Session - 03/22/23 1010     Visit Number 5    Number of Visits 20    Date for PT Re-Evaluation 04/28/23    Authorization Type [employer] $10 copay    Progress Note Due on Visit 10    PT Start Time 0931    PT Stop Time 1014    PT Time Calculation (min) 43 min    Activity Tolerance Patient tolerated treatment well;No increased pain    Behavior During Therapy WFL for tasks assessed/performed             Past Medical History:  Diagnosis Date   Arthritis    GERD (gastroesophageal reflux disease)    Hyperlipidemia    Hypothyroidism    Macular degeneration    Thyroid  disease    hyper active   Past Surgical History:  Procedure Laterality Date   ANKLE ARTHROSCOPY WITH REPAIR SUBLUXING TENDON     KNEE CARTILAGE SURGERY     SHOULDER ARTHROSCOPY W/ ROTATOR CUFF REPAIR     TOTAL KNEE ARTHROPLASTY Left 09/01/2022   Procedure: LEFT TOTAL KNEE ARTHROPLASTY;  Surgeon: Vernetta Lonni GRADE, MD;  Location: [facility];  Service: Orthopedics;  Laterality: Left;   Patient Active Problem List   Diagnosis Date Noted   Status post total left knee replacement 09/01/2022   Unilateral primary osteoarthritis, right knee 06/21/2022   AC (acromioclavicular) arthritis 08/26/2019   Impingement syndrome of left shoulder 08/26/2019   Biceps tendonitis on left 08/26/2019   Pain in left shoulder 10/28/2018    PCP: Eddie Lucie DELENA, MD   REFERRING PROVIDER: Vernetta Lonni GRADE*   REFERRING DIAG:  F74.448 (ICD-10-CM) - Pain in right hip   THERAPY DIAG:  Pain in right hip  Pain in left hip  Difficulty in walking, not elsewhere classified  Muscle weakness (generalized)  Rationale for Evaluation and Treatment: Rehabilitation  ONSET DATE: 1 month SUBJECTIVE:   SUBJECTIVE STATEMENT: [name] notes pain has been better over the past week.  Within 15-20 minutes of getting up he is much better.  Up stairs is still difficult.    PERTINENT HISTORY: Lt TKA (08/2022), arthriris, GERD, thyroid  disease, macular degeneration, HLD, ankle arthroscopy, shoulder arthro with RTC repair  PAIN:  NPRS scale: 0-3/10 this week Pain location: Rt and Lt anterior thigh Pain description: painful not sharp or cramping Aggravating factors: stairs and the first 15-20 minutes after getting up in the morning Relieving factors: sitting down, rest  PRECAUTIONS: None  WEIGHT BEARING RESTRICTIONS: No  FALLS:  Has patient fallen in last 6 months? No  LIVING ENVIRONMENT: Lives with: lives with their spouse Lives in: House/apartment Stairs: Yes: External: 3 steps; bilateral but cannot reach both Has following equipment at home: Vannie - 2 wheeled  OCCUPATION: retired airline pilot   PLOF: Independent  PATIENT GOALS: find out what's going on. Walking long distances, being more active.   Next MD visit: 04/02/23 Dr. Vernetta   OBJECTIVE:   DIAGNOSTIC FINDINGS:  per epic 02/19/23  x-ray:An AP pelvis and lateral the right hip shows normal-appearing hip joint spaces bilaterally.  PATIENT SURVEYS:   Patient-specific activity scoring scheme   0 represents "unable to perform." 10 represents "able to perform at prior level. 0 1 2 3 4 5 6 7 8 9  10 (Date and Score) Activity Initial  Activity Eval- 02/27/23 03/20/23   Stairs 4 8   2. Walking 4 8   3. Squatting/ bending to pick up items 6 6  4.    5.     Total score = sum of the activity scores/number of  activities Minimum detectable change (90%CI) for average score = 2 points Minimum detectable change (90%CI) for single activity score = 3 points  Score: 4.66 avg (02/27/2023)   COGNITION: 02/27/2023 Overall cognitive status: WFL    SENSATION: 02/27/2023 WFL   MUSCLE LENGTH: 02/27/2023 (+) ely's, thomas test bilateral, more on Rt than Lt.   POSTURE:  02/27/2023 Stiff flat back posture  PALPATION: 02/27/2023 Bilaterally: non-tender to acetabulum, ITB, quad, or glutes.   LUMBAR AROM: 03/06/2023: Lumbar extension : 50 % WFL  LOWER EXTREMITY ROM:   ROM Right eval Left eval  Hip flexion    Hip extension    Hip abduction    Hip adduction    Hip internal rotation    Hip external rotation    Knee flexion    Knee extension    Ankle dorsiflexion    Ankle plantarflexion    Ankle inversion    Ankle eversion     (Blank rows = not tested)  LOWER EXTREMITY MMT:  MMT Right 02/27/2023 Left 02/27/2023 Rt 03/20/23 Rt 03/20/23  Hip flexion 3/5 3+ 4- 4-  Hip extension 3-/5 3-/5 4- 4-  Hip abduction 3+/5 4/5 4- 4+  Hip adduction 3/5 3/5 4- 4-  Hip internal rotation      Hip external rotation      Knee flexion 4/5 4/5 4+ 4+  Knee extension 5/5 5/5 5 5   Ankle dorsiflexion 5/5 5/5 5 5   Ankle plantarflexion      Ankle inversion      Ankle eversion       (Blank rows = not tested)   SPECIAL TESTS:  02/27/2023 Hip special tests: Ely's test: positive bilaterally with R showing increased tightness compared to L  FUNCTIONAL TESTS:  02/27/2023 None tested  GAIT: 02/27/2023 Distance walked: clinical distances Assistive device utilized: None Level of assistance: Complete Independence Comments: R knee genu valgum, decreased step length possibly due to decreased flexibility in hip flexors preventing adequate extension of stance leg.  TODAY'S TREATMENT                                                                          DATE:  03/22/2023 Seated straight leg raises, 4 inch lift, quad set 1st, pause 2 seconds, lift 4 inches, hold 2 seconds, slow eccentrics 3 sets of 5 with 1.5#, extra emphasis on avoiding quadriceps lag Lumbar extension AROM 5 x 3 seconds each for hips forward (hands on gluteals and forearms on wall, 5 x each for lower lumbar) and lean back 5 x (upper lumbar) Bridging with feet all the way back 10 x 5 second Hip flexor stretch at edge of bed 5 x 15 seconds bilateral, one leg bent on table and other leg off the side Side-lie hip abduction 10 x 3 seconds (1/8 turn from your stomach) Hip hike in door frame, 1 arm against wall with elbow bent and opposite arm on opposite door frame 4 sets of 5 for 3 seconds, no lateral lean, GOOD POSTURE  Functional Activities: Double Leg Press 125# slow eccentrics 15 x Single Leg Press 75# slow eccentrics 10 x each side   03/20/23: TherEx: Nustep: level 6 x 11 minutes Standing hip hiking: x 20 bil LE c UE support Standing hip extension x 15 bil LE c UE support Seated SLR: 2 x 10 bil LE holidng 5 sec Knee flexion machine: single 35# 2 x 10 bil LE Functional Activities: Double Leg Press 106# slow eccentrics 15 x Single Leg Press 75# slow eccentrics 10 x each side   03/16/2023 Seated straight leg raises, 4 inch lift, quad set 1st, pause 2 seconds, lift 4 inches, hold 2 seconds, slow eccentrics 3 sets of 5 Lumbar extension AROM 10 x 3 seconds each for hips forward (lower lumbar) and lean back (upper lumbar) Bridging with feet all the way back 10 x 2 second Hip flexor stretch at edge of bed 5 x 15 seconds bilateral Side-lie hip abduction 10 x 3 seconds (1/8 turn from your stomach)  Functional Activities: Double Leg Press 100# slow eccentrics 15 x Single Leg Press 75# slow eccentrics 10 x each side    PATIENT EDUCATION:  Education details: HEP,  POC Person educated: Patient Education method: Programmer, Multimedia, Demonstration, Verbal cues, and Handouts Education comprehension: verbalized understanding, returned demonstration, and verbal cues required  HOME EXERCISE PROGRAM: Access Code: [facility] URL: [email or web address] Date: 03/22/2023 Prepared by: Lamar Ivory  Exercises - Standing Lumbar Extension with Counter  - 3-5 x daily - 7 x weekly - 1 sets - 5-10 reps - Supine Bridge  - 1-2 x daily - 7 x weekly - 1-2 sets - 10 reps - 2 hold - Hip Flexor Stretch at Edge of Bed  - 1-2 x daily - 7 x weekly - 1 sets - 5 reps - 15 hold - Sidelying Hip Abduction  - 1-2 x daily - 7 x weekly - 1-2 sets - 10-15 reps - Standing Lumbar Extension at Wall - Forearms  - 3-5 x daily - 7 x weekly - 1 sets - 5-10 reps - 3 seconds hold - Small Range Straight Leg Raise  - 2 x daily - 7 x weekly - 3-5 sets - 5 reps - 3 seconds hold -  Standing Hip Hiking  - 1 x daily - 7 x weekly - 5 sets - 5 reps - 3 seconds hold  ASSESSMENT:  CLINICAL IMPRESSION: Genevieve notes that his bilateral anterior thigh pain has been much better over the last week.  He still has symptoms for the first 15 to 20 minutes after waking up and he still has some difficulty with stairs.  This is being addressed with his current home program and is expected to improve with the current plan of care.  OBJECTIVE IMPAIRMENTS: Abnormal gait, decreased endurance, decreased knowledge of condition, decreased mobility, difficulty walking, decreased ROM, decreased strength, hypomobility, increased fascial restrictions, impaired flexibility, and pain.   ACTIVITY LIMITATIONS: carrying, lifting, bending, standing, squatting, sleeping, stairs, and locomotion level  PARTICIPATION LIMITATIONS: cleaning, laundry, driving, community activity, and yard work  PERSONAL FACTORS: Age and 3+ comorbidities: GERD, hyperlipidemia, hypothyroidism are also affecting patient's functional outcome.   REHAB  POTENTIAL: Good  CLINICAL DECISION MAKING: Stable/uncomplicated  EVALUATION COMPLEXITY: Low   GOALS: Goals reviewed with patient? Yes  SHORT TERM GOALS: (target date for Short term goals are 3 weeks 03/20/2023)   1.  Patient will demonstrate independent use of home exercise program to maintain progress from in clinic treatments.  Goal status: Met 03/16/2023  LONG TERM GOALS: (target dates for all long term goals are 10 weeks 05/08/2023)   1. Patient will demonstrate/report pain at worst less than or equal to 2/10 to facilitate minimal limitation in daily activity secondary to pain symptoms.  Goal status: on-going 03/20/23   2. Patient will demonstrate independent use of home exercise program to facilitate ability to maintain/progress functional gains from skilled physical therapy services.  Goal status: on-going 03/20/23   3. Patient will demonstrate Patient specific functional scale avg > or = 8 to indicate reduced disability due to condition.   Goal status: On-going 03/20/23   4.  Patient will demonstrate bilateral LE MMT 5/5 throughout to faciltiate usual transfers, stairs, squatting at [facility] for daily life.   Goal status: On-going 03/20/23   5.  Patient will demonstrate lumbar extension AROM > or = 50% to facilitate improved mobility.   Goal status: On-going 03/20/23  6. Patient will demonstrate improved hip flexor/quad mobility bilateral to demonstrate negative muscle length testing for improved mobility in activity.    Goal status On-going 03/20/23   PLAN:  PT FREQUENCY: 1-2x/week  PT DURATION: 10 weeks  PLANNED INTERVENTIONS: Can include 02853- PT Re-evaluation, 97110-Therapeutic exercises, 97530- Therapeutic activity, 97112- Neuromuscular re-education, 97535- Self Care, 97140- Manual therapy, [phone]- Gait training, [phone]- Orthotic Fit/training, [phone]- Canalith repositioning, [id number]- Aquatic Therapy, [phone]- Electrical stimulation (unattended), [phone]- Electrical stimulation  (manual), [id number] Physical performance testing, 97016- Vasopneumatic device, [id number]- Ultrasound, [id number]- Traction (mechanical), [id number]- Ionotophoresis 4mg /ml Dexamethasone , Patient/Family education, Balance training, Stair training, Taping, Dry Needling, Joint mobilization, Joint manipulation, Spinal manipulation, Spinal mobilization, Scar mobilization, Vestibular training, Visual/preceptual remediation/compensation, DME instructions, Cryotherapy, and Moist heat.  All performed as medically necessary.  All included unless contraindicated  PLAN FOR NEXT SESSION: hip/knee (Quadriceps) strengthening, functional mobility and balance.    Myer LELON Ivory PT, MPT 03/22/23 6:12 PM   03/22/23  6:12 PM

## 2023-04-02 ENCOUNTER — Ambulatory Visit: Payer: Medicare HMO | Admitting: Orthopaedic Surgery

## 2023-04-02 ENCOUNTER — Other Ambulatory Visit: Payer: Self-pay

## 2023-04-02 ENCOUNTER — Encounter: Payer: Self-pay | Admitting: Orthopaedic Surgery

## 2023-04-02 DIAGNOSIS — M25551 Pain in right hip: Secondary | ICD-10-CM

## 2023-04-02 DIAGNOSIS — Z96652 Presence of left artificial knee joint: Secondary | ICD-10-CM | POA: Diagnosis not present

## 2023-04-02 NOTE — Progress Notes (Signed)
 The patient returns for follow-up after having physical therapy as a relates to bilateral lower extremity strengthening.  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  He was having right hip pain and problems going up and down stairs.  I did provide a steroid injection of his right hip trochanteric area.  He does now report a little bit of right hip groin pain.  X-rays of his pelvis and right hip at the last visit showed minimal arthritic changes in either hip.  On today I can easily put his right and left hip through internal and external rotation with just some mild pain on the right side.  This point-continues to hip strengthening exercises and lower extremity strengthening exercises.  I would also like to send him to Dr. Alvester Morin to consider a steroid injection in his right hip joint under fluoroscopy to see how he responds to this clinically.  I will see him back in 4 weeks from now and by then he will of had a steroid injection in his right hip.  We note that he does have some complaint of right knee arthritis.  No x-rays are needed in 4 weeks from now.  He agrees with this treatment plan.

## 2023-04-10 ENCOUNTER — Other Ambulatory Visit: Payer: Self-pay

## 2023-04-10 ENCOUNTER — Ambulatory Visit: Admitting: Physical Medicine and Rehabilitation

## 2023-04-10 VITALS — BP 137/81 | HR 58

## 2023-04-10 DIAGNOSIS — M25551 Pain in right hip: Secondary | ICD-10-CM

## 2023-04-10 NOTE — Progress Notes (Unsigned)
 Pain Scale   Average Pain 3        +Driver, -BT, -Dye Allergies.

## 2023-04-11 DIAGNOSIS — M25551 Pain in right hip: Secondary | ICD-10-CM

## 2023-04-11 MED ORDER — BUPIVACAINE HCL 0.25 % IJ SOLN
4.0000 mL | INTRAMUSCULAR | Status: AC | PRN
Start: 2023-04-11 — End: 2023-04-11
  Administered 2023-04-11: 4 mL via INTRA_ARTICULAR

## 2023-04-11 MED ORDER — TRIAMCINOLONE ACETONIDE 40 MG/ML IJ SUSP
40.0000 mg | INTRAMUSCULAR | Status: AC | PRN
Start: 2023-04-11 — End: 2023-04-11
  Administered 2023-04-11: 40 mg via INTRA_ARTICULAR

## 2023-04-11 NOTE — Progress Notes (Signed)
   Eddie Young - 78 y.o. male MRN [id number]  Date of birth: 09-May-1945  Office Visit Note: Visit Date: 04/10/2023 PCP: Gwenlyn Found, MD Referred by: Gwenlyn Found, MD  Subjective: Chief Complaint  Patient presents with   Right Hip - Pain   HPI:  Eddie Young is a 78 y.o. male who comes in today at the request of Dr. Doneen Poisson for planned Right anesthetic hip arthrogram with fluoroscopic guidance.  The patient has failed conservative care including home exercise, medications, time and activity modification.  This injection will be diagnostic and hopefully therapeutic.  Please see requesting physician notes for further details and justification.    ROS Otherwise per HPI.  Assessment & Plan: Visit Diagnoses:    ICD-10-CM   1. Pain in right hip  M25.551 XR C-ARM NO REPORT      Plan: No additional findings.   Meds & Orders: No orders of the defined types were placed in this encounter.   Orders Placed This Encounter  Procedures   Large Joint Inj   XR C-ARM NO REPORT    Follow-up: Return for visit to requesting provider as needed.   Procedures: Large Joint Inj: R hip joint on 04/11/2023 8:08 AM Indications: diagnostic evaluation and pain Details: 22 G 3.5 in needle, fluoroscopy-guided anterior approach  Arthrogram: No  Medications: 4 mL bupivacaine 0.25 %; 40 mg triamcinolone acetonide 40 MG/ML Outcome: tolerated well, no immediate complications  There was excellent flow of contrast producing a partial arthrogram of the hip. The patient did have relief of symptoms during the anesthetic phase of the injection. Procedure, treatment alternatives, risks and benefits explained, specific risks discussed. Consent was given by the patient. Immediately prior to procedure a time out was called to verify the correct patient, procedure, equipment, support staff and site/side marked as required. Patient was prepped and draped in the usual sterile fashion.           Clinical History: No specialty comments available.     Objective:  VS:  HT:    WT:   BMI:     BP:137/81  HR:(!) 58bpm  TEMP: ( )  RESP:  Physical Exam   Imaging: XR C-ARM NO REPORT Result Date: 04/10/2023 Please see Notes tab for imaging impression.

## 2023-04-23 ENCOUNTER — Ambulatory Visit: Admitting: Orthopaedic Surgery

## 2023-04-23 ENCOUNTER — Encounter: Payer: Self-pay | Admitting: Orthopaedic Surgery

## 2023-04-23 DIAGNOSIS — M25551 Pain in right hip: Secondary | ICD-10-CM

## 2023-04-23 DIAGNOSIS — Z96652 Presence of left artificial knee joint: Secondary | ICD-10-CM

## 2023-04-23 NOTE — Progress Notes (Signed)
 The patient is a 78 year old active gentleman who comes in for follow-up as relates to right hip pain.  He had been having some hip and groin pain with minimal arthritic changes on plain films.  We sent him for an intra-articular steroid injection under radiographic guidance by Dr. Daisey Dryer.  This was done on April 1 of this month.  He said he had a few days of significant pain relief for his pain actually went away.  He says is a little bit back now but not really bad at all and not bad enough to pursue any other intervention as of yet.  We did replace his left knee back in August of last year so it has been about 8 months now.  He says the knee is doing well.  His right hip moves smoothly and fluidly with no blocks to rotation and no real discomfort today.  From my standpoint we will see him back in 4 months.  This will be the 1 year standpoint from his left knee replacement and will have an AP and lateral of his left knee at that visit.  If there are issues before then he knows to reach out and let us  know.

## 2023-11-12 ENCOUNTER — Encounter: Payer: Self-pay | Admitting: Radiology
